# Patient Record
Sex: Female | Born: 1940 | Race: Black or African American | Hispanic: No | Smoking: Never smoker
Health system: Southern US, Community
[De-identification: ages and names within clinical notes are randomized; demographics above are authoritative.]

## PROBLEM LIST (undated history)

## (undated) DIAGNOSIS — N6009 Solitary cyst of unspecified breast: Secondary | ICD-10-CM

## (undated) DIAGNOSIS — Z923 Personal history of irradiation: Secondary | ICD-10-CM

## (undated) DIAGNOSIS — C50919 Malignant neoplasm of unspecified site of unspecified female breast: Secondary | ICD-10-CM

## (undated) DIAGNOSIS — M1990 Unspecified osteoarthritis, unspecified site: Secondary | ICD-10-CM

## (undated) DIAGNOSIS — R42 Dizziness and giddiness: Secondary | ICD-10-CM

## (undated) DIAGNOSIS — Z8041 Family history of malignant neoplasm of ovary: Secondary | ICD-10-CM

## (undated) DIAGNOSIS — Z853 Personal history of malignant neoplasm of breast: Secondary | ICD-10-CM

## (undated) DIAGNOSIS — I10 Essential (primary) hypertension: Secondary | ICD-10-CM

## (undated) DIAGNOSIS — Z8042 Family history of malignant neoplasm of prostate: Secondary | ICD-10-CM

## (undated) DIAGNOSIS — Z972 Presence of dental prosthetic device (complete) (partial): Secondary | ICD-10-CM

## (undated) DIAGNOSIS — Z801 Family history of malignant neoplasm of trachea, bronchus and lung: Secondary | ICD-10-CM

## (undated) DIAGNOSIS — Z803 Family history of malignant neoplasm of breast: Secondary | ICD-10-CM

## (undated) DIAGNOSIS — E785 Hyperlipidemia, unspecified: Secondary | ICD-10-CM

## (undated) HISTORY — DX: Hyperlipidemia, unspecified: E78.5

## (undated) HISTORY — DX: Malignant neoplasm of unspecified site of unspecified female breast: C50.919

## (undated) HISTORY — DX: Family history of malignant neoplasm of ovary: Z80.41

## (undated) HISTORY — DX: Family history of malignant neoplasm of trachea, bronchus and lung: Z80.1

## (undated) HISTORY — DX: Family history of malignant neoplasm of breast: Z80.3

## (undated) HISTORY — DX: Unspecified osteoarthritis, unspecified site: M19.90

## (undated) HISTORY — DX: Family history of malignant neoplasm of prostate: Z80.42

## (undated) HISTORY — DX: Solitary cyst of unspecified breast: N60.09

## (undated) HISTORY — DX: Personal history of malignant neoplasm of breast: Z85.3

## (undated) HISTORY — PX: SENTINEL LYMPH NODE BIOPSY: SHX2392

## (undated) HISTORY — DX: Essential (primary) hypertension: I10

---

## 1966-12-23 HISTORY — PX: ABDOMINAL HYSTERECTOMY: SHX81

## 2004-12-19 ENCOUNTER — Ambulatory Visit: Payer: Self-pay | Admitting: Unknown Physician Specialty

## 2005-12-24 ENCOUNTER — Ambulatory Visit: Payer: Self-pay | Admitting: Unknown Physician Specialty

## 2005-12-27 ENCOUNTER — Ambulatory Visit: Payer: Self-pay | Admitting: Unknown Physician Specialty

## 2006-05-09 ENCOUNTER — Ambulatory Visit: Payer: Self-pay | Admitting: Unknown Physician Specialty

## 2007-02-24 ENCOUNTER — Ambulatory Visit: Payer: Self-pay | Admitting: Unknown Physician Specialty

## 2008-04-18 ENCOUNTER — Ambulatory Visit: Payer: Self-pay | Admitting: Unknown Physician Specialty

## 2009-04-24 ENCOUNTER — Ambulatory Visit: Payer: Self-pay | Admitting: Unknown Physician Specialty

## 2010-05-01 ENCOUNTER — Ambulatory Visit: Payer: Self-pay | Admitting: Unknown Physician Specialty

## 2011-05-07 ENCOUNTER — Ambulatory Visit: Payer: Self-pay | Admitting: Unknown Physician Specialty

## 2012-05-08 ENCOUNTER — Ambulatory Visit: Payer: Self-pay | Admitting: Unknown Physician Specialty

## 2012-05-18 ENCOUNTER — Ambulatory Visit: Payer: Self-pay | Admitting: Unknown Physician Specialty

## 2012-12-23 DIAGNOSIS — C50919 Malignant neoplasm of unspecified site of unspecified female breast: Secondary | ICD-10-CM

## 2012-12-23 DIAGNOSIS — Z923 Personal history of irradiation: Secondary | ICD-10-CM

## 2012-12-23 HISTORY — DX: Personal history of irradiation: Z92.3

## 2012-12-23 HISTORY — PX: BREAST LUMPECTOMY: SHX2

## 2012-12-23 HISTORY — PX: BREAST CYST ASPIRATION: SHX578

## 2012-12-23 HISTORY — PX: BREAST BIOPSY: SHX20

## 2012-12-23 HISTORY — DX: Malignant neoplasm of unspecified site of unspecified female breast: C50.919

## 2013-05-10 ENCOUNTER — Ambulatory Visit: Payer: Self-pay | Admitting: Physician Assistant

## 2013-05-11 ENCOUNTER — Ambulatory Visit: Payer: Self-pay | Admitting: Physician Assistant

## 2013-05-27 ENCOUNTER — Encounter: Payer: Self-pay | Admitting: General Surgery

## 2013-05-27 ENCOUNTER — Ambulatory Visit (INDEPENDENT_AMBULATORY_CARE_PROVIDER_SITE_OTHER): Payer: Medicare Other | Admitting: General Surgery

## 2013-05-27 VITALS — BP 130/76 | HR 72 | Resp 12 | Ht 64.0 in | Wt 149.0 lb

## 2013-05-27 DIAGNOSIS — R928 Other abnormal and inconclusive findings on diagnostic imaging of breast: Secondary | ICD-10-CM

## 2013-05-27 NOTE — Patient Instructions (Addendum)
Patient advised that she needs to have a stereotactic breast biopsy done in the near future.     Breast Biopsy, Stereotactic A stereotactic breast biopsy takes a tissue sample from the breast with a special instrument. This is done when:  The problem (lump, abnormality, mass) can be seen on X-ray, but not felt on physical exam.  Suspicious, small calcium deposits (calcifications) are seen in the breast.  There is a change in shape or appearance of the breast, thickening, or asymmetry on mammogram (breast X-ray).  You have nipple changes (unusual or bloody discharge, crusting, retraction, dimpling).  Your caregiver is making a surgical diagnosis. The biopsy may be done on a special table, with your face down and your breasts placed through openings in the table. Computerized imaging (special form of X-rays) is used. The images are not obtained using regular X-ray film. So, exposure to radiation is reduced. Images are seen through several different angles. The surgeon removes small pieces of the suspicious tissue through a hollow needle. The tissue will be sent to the lab for analysis. The surgeon can look at the pictures right away, rather than wait for an X-ray to be developed. Your caregiver can mark the lesions (abnormal tissue formations) electronically. Then the computer can tell exactly where the problem is, or if it has moved. BENEFITS OF THE PROCEDURE  This is a good way to see if tiny lumps, abnormal looking tissue, or calcium deposits that you cannot feel are cancerous or require further treatment or follow-up.  Needle biopsy is a simple procedure. It may be performed in an outpatient imaging center. This means you have the procedure and go home the same day, without checking into a hospital.  It is less painful than open surgery. The results are as accurate as when a tissue sample is removed surgically.  The procedure is faster, less expensive, less invasive, does not distort the  breast, and leaves little or no scar.  Breast defects, which can make future mammograms hard to read and interpret, do not remain.  Recovery time is brief. Patients can soon resume their normal activities.  Using VAD (vacuum assisted device) may make it possible to remove entire lesions.  A breast biopsy can indicate if you need surgery, other treatment, or combined treatment. LET YOUR CAREGIVER KNOW ABOUT:  Allergies.  Medications taken, including herbs, eye drops, over-the-counter medications, and creams.  Use of steroids (by mouth or creams).  Previous problems with anesthetics or numbing medication.  If you are taking blood thinner medications or aspirin.  Possibility of pregnancy, if this applies.  History of blood clots (thrombophlebitis).  History of bleeding or blood problems.  Previous surgery.  Other health problems. RISKS AND COMPLICATIONS  Infection (germ growing in the wound). This can often be treated with antibiotics.  Bleeding, following surgery. Your surgeon takes every precaution to keep this from happening.  There is some concern that if a cancerous mass is present, cancer cells might be spread by the needle. Whether this actually happens is not known. It does not appear to be a significant risk.  X-ray guided breast biopsy is not infallible (not always correct). The problem may be missed or the extent of the problem may be underestimated. This would mean the biopsy did not manage to remove a piece of the diseased tissue or enough of the diseased tissue.  Lesions present, with calcium deposits scattered throughout the breast, are difficult to target by stereotactic method. Those lesions near the chest wall  also are hard to learn about by this method. If the mammogram shows only a vague change in tissue density, but no definite mass or nodule, the X-ray guided method may not be successful. Occasionally, even after a successful biopsy, the tissue diagnosis  remains uncertain. A surgical biopsy will be needed, if abnormal or precancerous cells are found on core biopsy.  Altering or deforming of the breast.  Unable to find, or missing the lesion.  Rarely, the needle may go through the chest wall into the lung area. TWO BIOPSY INSTRUMENTS MAY BE USED IN THE PROCEDURE The conventional biopsy device (core needle biopsy device) consists of an inner needle with a trough extending from it at one end, and an overlying sheath. It is attached to a spring-loaded mechanism that propels it forward. The trough fills with tissue. The outer sheath instantly moves forward to cut the tissue and keep it in the trough. Each sample is obtained in a fraction of a second. It is necessary to withdraw the needle after each sample is taken to collect the tissue.  A newer type of instrument, the VAD (vacuum assisted device), uses vacuum pressure to pull breast tissue into a needle and remove it. The needle does not need to be withdrawn after each sampling. Another advantage is that biopsies are obtained in an orderly manner, by rotating the device. This helps make sure that the entire area of interest will be sampled. When using the automated core biopsy needle, sampling is more random.  FOR COMFORT DURING THE TEST  Relax as much as possible.  Try to follow instructions, to speed up the test.  Let your caregiver know if you are uncomfortable, anxious, or in pain. PROCEDURE  You are awake during the procedure, and you go home the same day (outpatient). A specially trained radiologist will do this procedure. First, the skin is cleansed. Then, it is injected with a local anesthetic. A small nick is made in the skin, and the tip of the biopsy needle is put into the calculated site of the lesion. A special mammography machine uses ionizing radiation to help guide the radiologist's instrument to the site of the abnormal growth. At this point, stereo images are again obtained, to  confirm that the needle tip is at the problem area. Usually 5 to 10 samples are collected when doing a core biopsy. At least 12 are collected when using the vacuum assisted device (VAD). Then, a final set of images is obtained. If they show that the lesion has been mostly or completely removed, a small clip is left at the biopsy site. This is so that it can be easily located, in case the lesion turns out to be cancer. Afterward, the skin opening is stitched (sutured) or taped closed, and covered with a dressing. Your caregiver may apply a pressure dressing and an ice pack, to prevent bleeding and swelling in the breast.  X-ray guided breast biopsy can take 30 minutes to 1 hour, or more. The X-rays usually have no side effects, and no radiation remains in your body. There is usually little or no pain. Usually no scar is left from the tiny skin incision. Many women find that the major discomfort of the procedure is from lying on their stomach, or staying in 1 position for the length of the procedure. This discomfort may be reduced by carefully placed cushions. You should wear a good support bra to the procedure. You will be asked to remove jewelry, dentures, eye glasses,  metal objects, or clothing that might interfere with the X-ray images. You may want to have someone with you, to take you home after the procedure. AFTER THE PROCEDURE   After surgery, if you are doing well and have no problems, you will be allowed to go home.  You may resume your regular diet, or as directed by your caregiver. HOME CARE INSTRUCTIONS   Follow your caregiver's recommendations for medications, care of the biopsy site, follow-up appointments, and further treatment.  Only take over-the-counter or prescription medicines for pain, discomfort, or fever as directed by your caregiver.  An ice pack applied to the affected area may help with discomfort and keep the swelling down.  Change dressings as directed.  Wear a good  support bra for as long as your caregiver recommends.  Avoid strenuous activity for at least 24 hours, or as advised by your caregiver. Finding out the results of your test Not all test results are available during your visit. If your test results are not back during the visit, make an appointment with your caregiver to find out the results. Do not assume everything is normal if you have not heard from your caregiver or the medical facility. It is important for you to follow up on all of your test results.  SEEK MEDICAL CARE IF:   You develop a rash.  You have problems with your medicines.  You become lightheaded or dizzy. SEEK IMMEDIATE MEDICAL CARE IF:   There is increased bleeding (more than a small spot) from the biopsy site.  You notice redness, swelling, or increasing pain in the wound.  Pus is coming from the wound.  You have a fever.  You notice a bad smell coming from the wound or dressing.  You develop shortness of breath.  You develop chest pain.  You pass out. Document Released: 09/07/2003 Document Revised: 03/02/2012 Document Reviewed: 10/13/2009 [facility] Patient Information 2014 [place], [place].

## 2013-05-27 NOTE — Progress Notes (Signed)
Patient ID: Tina Johnston, female   DOB: 1941/01/07, 72 y.o.   MRN: [id number]  Chief Complaint  Patient presents with  . breast evaluation    category 4 mammogram new patient    HPI Tina Johnston is a 72 y.o. female here today for her follow up mammogram done on 05/10/13 with birad category 0. Additional views of the left breast and ultrasound of the left breast were done on 05/11/13 with a birad category 4. Patient does perform self breast checks and get regular mammograms. Her mother has a history of breast cancer diagnosed in her fifties. The patient states she has a history of bilateral breast cysts that have been drained in the past. HPI  Past Medical History  Diagnosis Date  . Breast cyst   . Arthritis   . Hypertension   . Hyperlipidemia     Past Surgical History  Procedure Laterality Date  . Abdominal hysterectomy  1968    Family History  Problem Relation Age of Onset  . Cancer Mother 73    breast    Social History History  Substance Use Topics  . Smoking status: Never Smoker   . Smokeless tobacco: Never Used  . Alcohol Use: No    No Known Allergies  Current Outpatient Prescriptions  Medication Sig Dispense Refill  . Calcium Carb-Cholecalciferol (CALCIUM PLUS VITAMIN D3 PO) Take 1 tablet by mouth daily.      . Glucosamine-MSM-Hyaluronic Acd (JOINT HEALTH PO) Take 2 capsules by mouth daily.      . meloxicam (MOBIC) 15 MG tablet Take 15 mg by mouth daily.      . potassium chloride (K-DUR) 10 MEQ tablet Take 1 tablet by mouth 2 (two) times daily.      . simvastatin (ZOCOR) 20 MG tablet Take 1 tablet by mouth daily.      Marland Kitchen triamterene-hydrochlorothiazide (MAXZIDE-25) 37.5-25 MG per tablet Take 1 tablet by mouth daily.       No current facility-administered medications for this visit.    Review of Systems Review of Systems  Constitutional: Negative.   Respiratory: Negative.   Cardiovascular: Negative.     Blood pressure 130/76, pulse 72, resp. rate 12,  height 5\' 4"  (1.626 m), weight 149 lb (67.586 kg).  Physical Exam Physical Exam  Constitutional: She appears well-developed and well-nourished.  Eyes: Conjunctivae are normal. No scleral icterus.  Neck: Trachea normal. No mass and no thyromegaly present.  Cardiovascular: Normal rate, regular rhythm, normal heart sounds and normal pulses.   No murmur heard. Pulmonary/Chest: Effort normal and breath sounds normal. Right breast exhibits no inverted nipple, no mass, no nipple discharge, no skin change and no tenderness. Left breast exhibits no inverted nipple, no mass, no nipple discharge, no skin change and no tenderness. Breasts are symmetrical.  Lymphadenopathy:    She has no cervical adenopathy.    She has no axillary adenopathy.    Data Reviewed  Mammogram reviewed showing adjacent areas of concern. One with microcalcification's and one with ill defined density.   Assessment    Recommends stereotactic biopsy of left breast.     Plan    Patient to have left breast stereotactic biopsy done.  Procedure explained to her.        SANKAR,SEEPLAPUTHUR G 05/27/2013, 8:54 PM

## 2013-05-28 ENCOUNTER — Other Ambulatory Visit: Payer: Self-pay

## 2013-05-28 DIAGNOSIS — R920 Mammographic microcalcification found on diagnostic imaging of breast: Secondary | ICD-10-CM

## 2013-05-28 NOTE — Progress Notes (Signed)
Patient scheduled for left stereotactic biopsy, for two sets of microcalcifications, on Monday, June 16 at 4:00 pm.  Patient informed.  Post biopsy appt with RN set for 06-15-13.

## 2013-06-07 ENCOUNTER — Ambulatory Visit: Payer: Self-pay | Admitting: General Surgery

## 2013-06-07 DIAGNOSIS — R920 Mammographic microcalcification found on diagnostic imaging of breast: Secondary | ICD-10-CM

## 2013-06-10 ENCOUNTER — Encounter: Payer: Self-pay | Admitting: General Surgery

## 2013-06-10 ENCOUNTER — Ambulatory Visit (INDEPENDENT_AMBULATORY_CARE_PROVIDER_SITE_OTHER): Payer: Medicare Other | Admitting: General Surgery

## 2013-06-10 VITALS — BP 130/76 | HR 74 | Resp 12 | Ht 62.0 in | Wt 143.0 lb

## 2013-06-10 DIAGNOSIS — C50919 Malignant neoplasm of unspecified site of unspecified female breast: Secondary | ICD-10-CM

## 2013-06-10 DIAGNOSIS — C50912 Malignant neoplasm of unspecified site of left female breast: Secondary | ICD-10-CM

## 2013-06-10 DIAGNOSIS — R920 Mammographic microcalcification found on diagnostic imaging of breast: Secondary | ICD-10-CM

## 2013-06-10 NOTE — Progress Notes (Signed)
Patient ID: Tina Johnston, female   DOB: 05-Dec-1941, 72 y.o.   MRN: [id number] Pt had stereotactic biopsy completes . Both areas-density and microcalcifications showed cancer. One inlocted lower in left breast suprior aspect had invasive cancer. 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 Pt was here with her sister. Findings were explained in full. PRoles of ER/PR,Her 2 neu, radiation, possible chemo, staus of axillar nodes all discussed.  Option of lumpectomy is feasible as the two lesions are relatively close by.  Discussed lumpectomy, mastectomy, sentinel node biopsy-explained procedures and risks . Pt is leaning toward lumpectomy. Will make decision after ER/PR and her 2 status is available.

## 2013-06-10 NOTE — Patient Instructions (Signed)
Await ER/PR and her 2 reports. Will decide on lumpectomy vs matectomy after

## 2013-06-15 ENCOUNTER — Telehealth: Payer: Self-pay | Admitting: General Surgery

## 2013-06-15 ENCOUNTER — Ambulatory Visit (INDEPENDENT_AMBULATORY_CARE_PROVIDER_SITE_OTHER): Payer: Medicare Other | Admitting: *Deleted

## 2013-06-15 ENCOUNTER — Encounter: Payer: Self-pay | Admitting: General Surgery

## 2013-06-15 DIAGNOSIS — C50912 Malignant neoplasm of unspecified site of left female breast: Secondary | ICD-10-CM

## 2013-06-15 DIAGNOSIS — C50919 Malignant neoplasm of unspecified site of unspecified female breast: Secondary | ICD-10-CM

## 2013-06-15 NOTE — Telephone Encounter (Signed)
Er/PR are both pos on herleft breast cancer. After review of her mammogram feel it is reasonable to get MRI- if no other areas of concern can proceed with lumpectomy.  Pt advise and is agreeable.

## 2013-06-15 NOTE — Patient Instructions (Addendum)
The patient is aware that a heating pad may be used for comfort as needed.  Aware of pathology. Follow up as scheduled afeter lab work results.

## 2013-06-15 NOTE — Progress Notes (Signed)
Patient here today for follow up post left breast biopsy. No dressing, steristrip in place and aware it may come off in one week.  Minimal bruising noted.  The patient is aware that a heating pad may be used for comfort as needed.  Aware of pathology. Follow up as scheduled after lab work results.

## 2013-06-16 ENCOUNTER — Other Ambulatory Visit: Payer: Self-pay | Admitting: *Deleted

## 2013-06-16 DIAGNOSIS — C50919 Malignant neoplasm of unspecified site of unspecified female breast: Secondary | ICD-10-CM

## 2013-06-16 NOTE — Progress Notes (Signed)
Patient to have a bilateral breast MRI at the [facility]. Date to be announced.

## 2013-06-21 ENCOUNTER — Ambulatory Visit
Admission: RE | Admit: 2013-06-21 | Discharge: 2013-06-21 | Disposition: A | Discharge status: Home / Self Care | Payer: Medicare Other | Source: Ambulatory Visit | Attending: General Surgery | Admitting: General Surgery

## 2013-06-21 ENCOUNTER — Encounter: Payer: Self-pay | Admitting: General Surgery

## 2013-06-21 MED ORDER — GADOBENATE DIMEGLUMINE 529 MG/ML IV SOLN
13.0000 mL | Freq: Once | INTRAVENOUS | Status: AC | PRN
  Administered 2013-06-21: 13 mL via INTRAVENOUS

## 2013-06-22 LAB — PATHOLOGY REPORT

## 2013-06-24 ENCOUNTER — Encounter: Payer: Self-pay | Admitting: General Surgery

## 2013-06-24 ENCOUNTER — Other Ambulatory Visit: Payer: Self-pay

## 2013-06-24 ENCOUNTER — Ambulatory Visit (INDEPENDENT_AMBULATORY_CARE_PROVIDER_SITE_OTHER): Payer: Medicare Other | Admitting: General Surgery

## 2013-06-24 VITALS — BP 110/70 | HR 84 | Resp 16 | Ht 63.0 in | Wt 149.0 lb

## 2013-06-24 DIAGNOSIS — N63 Unspecified lump in unspecified breast: Secondary | ICD-10-CM

## 2013-06-24 NOTE — Progress Notes (Signed)
Patient ID: Tina Johnston, female   DOB: 03/24/1941, 72 y.o.   MRN: [id number]  Chief Complaint  Patient presents with  . Follow-up    right breast ultrasound possible core biopsy    HPI Tina Johnston is a 72 y.o. female who presents for a right breast ultrasound and possible core biopsy. Breast MR showed the 2 areas in upper left breast that were biopsied-CA.  Also noted was a mass in right breast 9 o'cl. This area needed [place].  HPI  Past Medical History  Diagnosis Date  . Breast cyst   . Arthritis   . Hypertension   . Hyperlipidemia     Past Surgical History  Procedure Laterality Date  . Abdominal hysterectomy  1968    Family History  Problem Relation Age of Onset  . Cancer Mother 3    breast    Social History History  Substance Use Topics  . Smoking status: Never Smoker   . Smokeless tobacco: Never Used  . Alcohol Use: No    No Known Allergies  Current Outpatient Prescriptions  Medication Sig Dispense Refill  . Calcium Carb-Cholecalciferol (CALCIUM PLUS VITAMIN D3 PO) Take 1 tablet by mouth daily.      . Glucosamine-MSM-Hyaluronic Acd (JOINT HEALTH PO) Take 2 capsules by mouth daily.      . meloxicam (MOBIC) 15 MG tablet Take 15 mg by mouth daily.      . potassium chloride (K-DUR) 10 MEQ tablet Take 1 tablet by mouth 2 (two) times daily.      . simvastatin (ZOCOR) 20 MG tablet Take 1 tablet by mouth daily.      Marland Kitchen triamterene-hydrochlorothiazide (MAXZIDE-25) 37.5-25 MG per tablet Take 1 tablet by mouth daily.       No current facility-administered medications for this visit.    Review of Systems Review of Systems  Constitutional: Negative.   Respiratory: Negative.   Cardiovascular: Negative.     Blood pressure 110/70, pulse 84, resp. rate 16, height 5\' 3"  (1.6 m), weight 149 lb (67.586 kg).  Physical Exam Physical Exam  Data Reviewed MRI breast  Assessment    [place] today showed a small 7mm mas with shadowing at 9 o'cl right corresponding to  finding on MRI.      Plan    Core biopsy right breast mass completed today. After path available on right breast biopsy will plan surgical treatment.        SANKAR,SEEPLAPUTHUR G 06/24/2013, 8:47 AM

## 2013-06-24 NOTE — Patient Instructions (Addendum)

## 2013-06-28 ENCOUNTER — Telehealth: Payer: Self-pay

## 2013-06-28 NOTE — Telephone Encounter (Signed)
Dr. Delrae Alfred called.  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  Will fax detailed report when completed.

## 2013-06-29 ENCOUNTER — Other Ambulatory Visit: Payer: Self-pay | Admitting: General Surgery

## 2013-06-29 DIAGNOSIS — C50919 Malignant neoplasm of unspecified site of unspecified female breast: Secondary | ICD-10-CM

## 2013-07-05 ENCOUNTER — Encounter: Payer: Self-pay | Admitting: General Surgery

## 2013-07-08 ENCOUNTER — Ambulatory Visit: Payer: Self-pay | Admitting: General Surgery

## 2013-07-08 DIAGNOSIS — I10 Essential (primary) hypertension: Secondary | ICD-10-CM

## 2013-07-08 LAB — COMPREHENSIVE METABOLIC PANEL
Albumin: 3.6 g/dL (ref 3.4–5.0)
Alkaline Phosphatase: 94 U/L (ref 50–136)
Anion Gap: 7 (ref 7–16)
BUN: 11 mg/dL (ref 7–18)
Bilirubin,Total: 0.2 mg/dL (ref 0.2–1.0)
Calcium, Total: 9.6 mg/dL (ref 8.5–10.1)
Chloride: 104 mmol/L (ref 98–107)
Co2: 29 mmol/L (ref 21–32)
Creatinine: 1.1 mg/dL (ref 0.60–1.30)
EGFR (African American): 57 — ABNORMAL LOW
EGFR (Non-African Amer.): 49 — ABNORMAL LOW
Glucose: 101 mg/dL — ABNORMAL HIGH (ref 65–99)
Osmolality: 279 (ref 275–301)
Potassium: 3.8 mmol/L (ref 3.5–5.1)
SGOT(AST): 23 U/L (ref 15–37)
SGPT (ALT): 27 U/L (ref 12–78)
Sodium: 140 mmol/L (ref 136–145)
Total Protein: 8.1 g/dL (ref 6.4–8.2)

## 2013-07-08 LAB — CBC WITH DIFFERENTIAL/PLATELET
Basophil #: 0 10*3/uL (ref 0.0–0.1)
Basophil %: 0.6 %
Eosinophil #: 0.2 10*3/uL (ref 0.0–0.7)
Eosinophil %: 2.9 %
HCT: 37.8 % (ref 35.0–47.0)
HGB: 12.5 g/dL (ref 12.0–16.0)
Lymphocyte #: 2.1 10*3/uL (ref 1.0–3.6)
Lymphocyte %: 30.9 %
MCH: 28.3 pg (ref 26.0–34.0)
MCHC: 33 g/dL (ref 32.0–36.0)
MCV: 86 fL (ref 80–100)
Monocyte #: 0.5 x10 3/mm (ref 0.2–0.9)
Monocyte %: 7 %
Neutrophil #: 4.1 10*3/uL (ref 1.4–6.5)
Neutrophil %: 58.6 %
Platelet: 377 10*3/uL (ref 150–440)
RBC: 4.4 10*6/uL (ref 3.80–5.20)
RDW: 14.3 % (ref 11.5–14.5)
WBC: 6.9 10*3/uL (ref 3.6–11.0)

## 2013-07-09 ENCOUNTER — Encounter: Payer: Self-pay | Admitting: General Surgery

## 2013-07-12 ENCOUNTER — Encounter: Payer: Self-pay | Admitting: General Surgery

## 2013-07-12 ENCOUNTER — Ambulatory Visit (INDEPENDENT_AMBULATORY_CARE_PROVIDER_SITE_OTHER): Payer: Medicare Other | Admitting: General Surgery

## 2013-07-12 VITALS — BP 128/72 | HR 68 | Resp 12 | Ht 63.0 in | Wt 149.0 lb

## 2013-07-12 DIAGNOSIS — C50411 Malignant neoplasm of upper-outer quadrant of right female breast: Secondary | ICD-10-CM

## 2013-07-12 DIAGNOSIS — C50412 Malignant neoplasm of upper-outer quadrant of left female breast: Secondary | ICD-10-CM

## 2013-07-12 DIAGNOSIS — C50419 Malignant neoplasm of upper-outer quadrant of unspecified female breast: Secondary | ICD-10-CM

## 2013-07-12 LAB — PATHOLOGY

## 2013-07-12 NOTE — Progress Notes (Signed)
Patient ID: Tina Johnston, female   DOB: November 11, 1941, 72 y.o.   MRN: [id number]  Chief Complaint  Patient presents with  . Follow-up    u/s    HPI Tina Johnston is a 72 y.o. female here today for her pre op ultrasound. Pt has bilateral breast CA, ER/PR pos and her 2 neg. She is scheduled for lumpectomy/SN biopsy on both sides. Here for [place] too see if the biopsy sites are visible. HPI  Past Medical History  Diagnosis Date  . Breast cyst   . Arthritis   . Hypertension   . Hyperlipidemia     Past Surgical History  Procedure Laterality Date  . Abdominal hysterectomy  1968    Family History  Problem Relation Age of Onset  . Cancer Mother 105    breast    Social History History  Substance Use Topics  . Smoking status: Never Smoker   . Smokeless tobacco: Never Used  . Alcohol Use: No    No Known Allergies  Current Outpatient Prescriptions  Medication Sig Dispense Refill  . Calcium Carb-Cholecalciferol (CALCIUM PLUS VITAMIN D3 PO) Take 1 tablet by mouth daily.      . Glucosamine-MSM-Hyaluronic Acd (JOINT HEALTH PO) Take 2 capsules by mouth daily.      . meloxicam (MOBIC) 15 MG tablet Take 15 mg by mouth daily.      . potassium chloride (K-DUR) 10 MEQ tablet Take 1 tablet by mouth 2 (two) times daily.      . simvastatin (ZOCOR) 20 MG tablet Take 1 tablet by mouth daily.      Marland Kitchen triamterene-hydrochlorothiazide (MAXZIDE-25) 37.5-25 MG per tablet Take 1 tablet by mouth daily.       No current facility-administered medications for this visit.    Review of Systems Review of Systems  Constitutional: Negative.   Respiratory: Negative.   Cardiovascular: Negative.     Blood pressure 128/72, pulse 68, resp. rate 12, height 5\' 3"  (1.6 m), weight 149 lb (67.586 kg).  Physical Exam Physical Exam [place] of right breast at 9 o'cl shows biopsy site well. [place] of left breast at 12 o'cl also shows 2 biopsy sites. OK to proceed with [place] localization for lumpectomy. Data Reviewed [place]  today  Assessment    Bilateral breast CA     Plan    Proceed with surgery as planned        SANKAR,SEEPLAPUTHUR G 07/13/2013, 7:52 AM

## 2013-07-13 ENCOUNTER — Encounter: Payer: Self-pay | Admitting: General Surgery

## 2013-07-13 DIAGNOSIS — C50419 Malignant neoplasm of upper-outer quadrant of unspecified female breast: Secondary | ICD-10-CM | POA: Insufficient documentation

## 2013-07-13 NOTE — Patient Instructions (Signed)
Advised on surgery as discussed before

## 2013-07-15 ENCOUNTER — Ambulatory Visit: Payer: Self-pay | Admitting: General Surgery

## 2013-07-15 ENCOUNTER — Encounter: Payer: Self-pay | Admitting: General Surgery

## 2013-07-15 DIAGNOSIS — C50419 Malignant neoplasm of upper-outer quadrant of unspecified female breast: Secondary | ICD-10-CM

## 2013-07-15 HISTORY — PX: BREAST SURGERY: SHX581

## 2013-07-19 ENCOUNTER — Encounter: Payer: Self-pay | Admitting: General Surgery

## 2013-07-20 LAB — PATHOLOGY REPORT

## 2013-07-23 ENCOUNTER — Encounter: Payer: Self-pay | Admitting: General Surgery

## 2013-07-27 ENCOUNTER — Encounter: Payer: Self-pay | Admitting: General Surgery

## 2013-07-27 ENCOUNTER — Ambulatory Visit (INDEPENDENT_AMBULATORY_CARE_PROVIDER_SITE_OTHER): Payer: Medicare Other | Admitting: General Surgery

## 2013-07-27 ENCOUNTER — Other Ambulatory Visit: Payer: Self-pay | Admitting: Physician Assistant

## 2013-07-27 VITALS — BP 122/78 | HR 74 | Resp 16 | Ht 63.0 in | Wt 149.0 lb

## 2013-07-27 DIAGNOSIS — C50412 Malignant neoplasm of upper-outer quadrant of left female breast: Secondary | ICD-10-CM

## 2013-07-27 DIAGNOSIS — C50419 Malignant neoplasm of upper-outer quadrant of unspecified female breast: Secondary | ICD-10-CM

## 2013-07-27 DIAGNOSIS — C50411 Malignant neoplasm of upper-outer quadrant of right female breast: Secondary | ICD-10-CM

## 2013-07-27 NOTE — Patient Instructions (Addendum)
Call office for any breast issues or concerns.  Patient's surgery has been scheduled for 07-30-13 at [facility].

## 2013-07-27 NOTE — Progress Notes (Signed)
Patient ID: Tina Johnston, female   DOB: Apr 07, 1941, 72 y.o.   MRN: [id number]  Chief Complaint  Patient presents with  . Routine Post Op    7-14 day post op bilateral breast wide excision with sentinenl node biopsy    HPI Tina Johnston is a 72 y.o. female who presents for a routine 7-14 day post op bilateral breast wide excision with sentinel node biopsy. The procedure was performed on 07/15/13. Left breast more tender than the right side. HPI  Past Medical History  Diagnosis Date  . Breast cyst   . Arthritis   . Hypertension   . Hyperlipidemia     Past Surgical History  Procedure Laterality Date  . Abdominal hysterectomy  1968  . Breast surgery Bilateral July 15 2013    wide excision w sentinel node biopsy    Family History  Problem Relation Age of Onset  . Cancer Mother 10    breast    Social History History  Substance Use Topics  . Smoking status: Never Smoker   . Smokeless tobacco: Never Used  . Alcohol Use: No    Allergies  Allergen Reactions  . Ultram (Tramadol) Itching    Current Outpatient Prescriptions  Medication Sig Dispense Refill  . Calcium Carb-Cholecalciferol (CALCIUM PLUS VITAMIN D3 PO) Take 1 tablet by mouth daily.      . Glucosamine-MSM-Hyaluronic Acd (JOINT HEALTH PO) Take 2 capsules by mouth daily.      . meloxicam (MOBIC) 15 MG tablet Take 15 mg by mouth daily.      . potassium chloride (K-DUR) 10 MEQ tablet Take 1 tablet by mouth 2 (two) times daily.      . simvastatin (ZOCOR) 20 MG tablet Take 1 tablet by mouth daily.      Marland Kitchen triamterene-hydrochlorothiazide (MAXZIDE-25) 37.5-25 MG per tablet Take 1 tablet by mouth daily.       No current facility-administered medications for this visit.    Review of Systems Review of Systems  Constitutional: Negative.   Respiratory: Negative.   Cardiovascular: Negative.     Blood pressure 122/78, pulse 74, resp. rate 16, height 5\' 3"  (1.6 m), weight 149 lb (67.586 kg).  Physical  Exam Physical Exam  Constitutional: She is oriented to person, place, and time. She appears well-developed and well-nourished.  Neurological: She is alert and oriented to person, place, and time.  Skin: Skin is warm and dry.   Bilateral breast incisions and axillary healing well. 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 Focally at medial margin. Right side with 1.1 cm invasive cancaer, DCIS involves inferior margin. SN on both sides are negative.  Assessment    Pt needs reexcison to get clear margins, followed by radiation and subsequent antihormonal therapy. Discussed fully with pt. She is agreeable.     Plan          Patient's surgery has been scheduled for 07-30-13 at [facility].    Tina Johnston M 07/27/2013, 2:13 PM

## 2013-07-30 ENCOUNTER — Ambulatory Visit: Payer: Self-pay | Admitting: General Surgery

## 2013-07-30 DIAGNOSIS — C50419 Malignant neoplasm of upper-outer quadrant of unspecified female breast: Secondary | ICD-10-CM

## 2013-07-30 DIAGNOSIS — C50919 Malignant neoplasm of unspecified site of unspecified female breast: Secondary | ICD-10-CM

## 2013-08-03 LAB — HER-2 / NEU, FISH
Avg Num Her-2 signals/nucleus:: 1.5
HER-2/CEP17 Ratio: 1.09
Number of Observers:: 2
Number of Tumor Cells Counted:: 40

## 2013-08-03 LAB — ER/PR,IMMUNOHISTOCHEM,PARAFFIN

## 2013-08-04 ENCOUNTER — Encounter: Payer: Self-pay | Admitting: General Surgery

## 2013-08-06 LAB — PATHOLOGY REPORT

## 2013-08-09 ENCOUNTER — Encounter: Payer: Self-pay | Admitting: General Surgery

## 2013-08-09 ENCOUNTER — Ambulatory Visit (INDEPENDENT_AMBULATORY_CARE_PROVIDER_SITE_OTHER): Payer: Medicare Other | Admitting: General Surgery

## 2013-08-09 VITALS — BP 134/80 | HR 72 | Resp 12 | Ht 63.0 in | Wt 149.0 lb

## 2013-08-09 DIAGNOSIS — C50419 Malignant neoplasm of upper-outer quadrant of unspecified female breast: Secondary | ICD-10-CM

## 2013-08-09 DIAGNOSIS — C50919 Malignant neoplasm of unspecified site of unspecified female breast: Secondary | ICD-10-CM

## 2013-08-09 DIAGNOSIS — C50412 Malignant neoplasm of upper-outer quadrant of left female breast: Secondary | ICD-10-CM

## 2013-08-09 DIAGNOSIS — C50411 Malignant neoplasm of upper-outer quadrant of right female breast: Secondary | ICD-10-CM

## 2013-08-09 NOTE — Patient Instructions (Addendum)
Resume normal activities as tolerated.  Patient to see Dr. Rushie Chestnut at the [facility] on 08-17-13 at 9 am. She is aware of date, time, and instructions.

## 2013-08-09 NOTE — Progress Notes (Signed)
Tina Johnston is a 72 y.o. female who presents for a routine 7-14 day post op bilateral breast wide re excision for margins.  Incisions look clean. Path reveals the margins are clear. Patient doing well post op. Oncotype DX is pending. She has ER/PR pos, her 2 neg cancer bilateral, SN neg.    Patient to see Dr. Rushie Chestnut at the [facility] on 08-17-13 at 9 am. She is aware of date, time, and instructions.

## 2013-08-10 ENCOUNTER — Encounter: Payer: Self-pay | Admitting: General Surgery

## 2013-08-10 DIAGNOSIS — C50919 Malignant neoplasm of unspecified site of unspecified female breast: Secondary | ICD-10-CM | POA: Insufficient documentation

## 2013-08-10 DIAGNOSIS — C50911 Malignant neoplasm of unspecified site of right female breast: Secondary | ICD-10-CM | POA: Insufficient documentation

## 2013-08-11 ENCOUNTER — Telehealth: Payer: Self-pay | Admitting: *Deleted

## 2013-08-11 ENCOUNTER — Ambulatory Visit: Payer: Self-pay | Admitting: Oncology

## 2013-08-11 NOTE — Telephone Encounter (Signed)
Oncotype testing called and regarding left and right breast specimens.  Per Dr Evette Cristal start with right breast testing for now. [facility] (onotype testing center) aware and Dr Forde Dandy will leave message for Dr Oneita Kras as well [facility])

## 2013-08-16 ENCOUNTER — Ambulatory Visit: Payer: Self-pay | Admitting: Radiation Oncology

## 2013-08-23 ENCOUNTER — Ambulatory Visit: Payer: Self-pay | Admitting: Radiation Oncology

## 2013-08-23 ENCOUNTER — Ambulatory Visit: Payer: Self-pay | Admitting: Oncology

## 2013-08-26 NOTE — Telephone Encounter (Signed)
Notified Chris at [facility] that the left breast testing is not needed per Dr Evette Cristal.

## 2013-09-22 ENCOUNTER — Ambulatory Visit: Payer: Self-pay | Admitting: Radiation Oncology

## 2013-09-22 ENCOUNTER — Ambulatory Visit: Payer: Self-pay | Admitting: Oncology

## 2013-10-15 LAB — CBC CANCER CENTER
Basophil #: 0 x10 3/mm (ref 0.0–0.1)
Eosinophil #: 0.2 x10 3/mm (ref 0.0–0.7)
Eosinophil %: 4.1 %
HCT: 39.5 % (ref 35.0–47.0)
HGB: 13.1 g/dL (ref 12.0–16.0)
Lymphocyte %: 21.4 %
MCH: 28.8 pg (ref 26.0–34.0)
MCHC: 33.1 g/dL (ref 32.0–36.0)
MCV: 87 fL (ref 80–100)
Monocyte %: 11.2 %
Neutrophil %: 62.8 %
RBC: 4.54 10*6/uL (ref 3.80–5.20)

## 2013-10-22 LAB — CBC CANCER CENTER
Basophil %: 0.4 %
Eosinophil #: 0.2 x10 3/mm (ref 0.0–0.7)
HGB: 12.9 g/dL (ref 12.0–16.0)
MCH: 27.9 pg (ref 26.0–34.0)
MCHC: 32.4 g/dL (ref 32.0–36.0)
MCV: 86 fL (ref 80–100)
Monocyte #: 0.4 x10 3/mm (ref 0.2–0.9)
Monocyte %: 7.4 %
Neutrophil #: 3.8 x10 3/mm (ref 1.4–6.5)
Neutrophil %: 70.7 %
Platelet: 363 x10 3/mm (ref 150–440)
RBC: 4.63 10*6/uL (ref 3.80–5.20)
RDW: 14.8 % — ABNORMAL HIGH (ref 11.5–14.5)
WBC: 5.4 x10 3/mm (ref 3.6–11.0)

## 2013-10-23 ENCOUNTER — Ambulatory Visit: Payer: Self-pay | Admitting: Radiation Oncology

## 2013-10-23 ENCOUNTER — Ambulatory Visit: Payer: Self-pay | Admitting: Oncology

## 2013-10-25 LAB — HEPATIC FUNCTION PANEL A (ARMC)
Albumin: 3.6 g/dL (ref 3.4–5.0)
Bilirubin,Total: 0.3 mg/dL (ref 0.2–1.0)
SGOT(AST): 25 U/L (ref 15–37)

## 2013-10-25 LAB — CBC CANCER CENTER
Basophil #: 0 x10 3/mm (ref 0.0–0.1)
Basophil %: 0.7 %
HCT: 38.7 % (ref 35.0–47.0)
Lymphocyte #: 1 x10 3/mm (ref 1.0–3.6)
Lymphocyte %: 18.6 %
MCH: 28 pg (ref 26.0–34.0)
MCHC: 32.4 g/dL (ref 32.0–36.0)
MCV: 87 fL (ref 80–100)
Neutrophil #: 3.4 x10 3/mm (ref 1.4–6.5)
Neutrophil %: 65.9 %
Platelet: 357 x10 3/mm (ref 150–440)
WBC: 5.1 x10 3/mm (ref 3.6–11.0)

## 2013-10-25 LAB — CREATININE, SERUM
Creatinine: 1.01 mg/dL (ref 0.60–1.30)
EGFR (African American): >60
EGFR (Non-African Amer.): 55 — ABNORMAL LOW

## 2013-11-22 ENCOUNTER — Ambulatory Visit: Payer: Self-pay | Admitting: Radiation Oncology

## 2013-11-22 ENCOUNTER — Ambulatory Visit: Payer: Self-pay | Admitting: Oncology

## 2014-01-24 ENCOUNTER — Ambulatory Visit: Payer: Self-pay | Admitting: Internal Medicine

## 2014-01-25 LAB — CREATININE, SERUM
Creatinine: 1.06 mg/dL (ref 0.60–1.30)
EGFR (Non-African Amer.): 52 — ABNORMAL LOW
GFR CALC AF AMER: 60 — AB

## 2014-01-25 LAB — CBC CANCER CENTER
Basophil #: 0 x10 3/mm (ref 0.0–0.1)
Basophil %: 0.4 %
EOS ABS: 0.1 x10 3/mm (ref 0.0–0.7)
EOS PCT: 2.7 %
HCT: 37.4 % (ref 35.0–47.0)
HGB: 12.3 g/dL (ref 12.0–16.0)
LYMPHS ABS: 1.3 x10 3/mm (ref 1.0–3.6)
Lymphocyte %: 23.6 %
MCH: 28 pg (ref 26.0–34.0)
MCHC: 32.8 g/dL (ref 32.0–36.0)
MCV: 85 fL (ref 80–100)
MONO ABS: 0.4 x10 3/mm (ref 0.2–0.9)
MONOS PCT: 7.3 %
NEUTROS ABS: 3.6 x10 3/mm (ref 1.4–6.5)
Neutrophil %: 66 %
Platelet: 383 x10 3/mm (ref 150–440)
RBC: 4.38 10*6/uL (ref 3.80–5.20)
RDW: 14.8 % — AB (ref 11.5–14.5)
WBC: 5.4 x10 3/mm (ref 3.6–11.0)

## 2014-01-25 LAB — HEPATIC FUNCTION PANEL A (ARMC)
Albumin: 3.7 g/dL (ref 3.4–5.0)
Alkaline Phosphatase: 88 U/L
Bilirubin, Direct: 0.1 mg/dL (ref 0.00–0.20)
Bilirubin,Total: 0.3 mg/dL (ref 0.2–1.0)
SGOT(AST): 26 U/L (ref 15–37)
SGPT (ALT): 33 U/L (ref 12–78)
Total Protein: 8 g/dL (ref 6.4–8.2)

## 2014-01-26 LAB — CANCER ANTIGEN 27.29: CA 27.29: 18.7 U/mL (ref 0.0–38.6)

## 2014-02-20 ENCOUNTER — Ambulatory Visit: Payer: Self-pay | Admitting: Internal Medicine

## 2014-05-12 ENCOUNTER — Ambulatory Visit: Payer: Self-pay | Admitting: Physician Assistant

## 2014-05-24 ENCOUNTER — Ambulatory Visit: Payer: Self-pay | Admitting: Radiation Oncology

## 2014-05-25 LAB — CBC CANCER CENTER
Basophil #: 0 x10 3/mm (ref 0.0–0.1)
Basophil %: 0.5 %
EOS ABS: 0.2 x10 3/mm (ref 0.0–0.7)
Eosinophil %: 2.9 %
HCT: 36.9 % (ref 35.0–47.0)
HGB: 12 g/dL (ref 12.0–16.0)
Lymphocyte #: 1.7 x10 3/mm (ref 1.0–3.6)
Lymphocyte %: 27.3 %
MCH: 28 pg (ref 26.0–34.0)
MCHC: 32.6 g/dL (ref 32.0–36.0)
MCV: 86 fL (ref 80–100)
Monocyte #: 0.4 x10 3/mm (ref 0.2–0.9)
Monocyte %: 6.3 %
Neutrophil #: 4 x10 3/mm (ref 1.4–6.5)
Neutrophil %: 63 %
PLATELETS: 371 x10 3/mm (ref 150–440)
RBC: 4.3 10*6/uL (ref 3.80–5.20)
RDW: 14.5 % (ref 11.5–14.5)
WBC: 6.3 x10 3/mm (ref 3.6–11.0)

## 2014-05-25 LAB — CREATININE, SERUM
Creatinine: 1.04 mg/dL (ref 0.60–1.30)
EGFR (African American): >60
EGFR (Non-African Amer.): 53 — ABNORMAL LOW

## 2014-05-25 LAB — HEPATIC FUNCTION PANEL A (ARMC)
AST: 16 U/L (ref 15–37)
Albumin: 3.8 g/dL (ref 3.4–5.0)
Alkaline Phosphatase: 78 U/L
BILIRUBIN TOTAL: 0.4 mg/dL (ref 0.2–1.0)
Bilirubin, Direct: 0.1 mg/dL (ref 0.00–0.20)
SGPT (ALT): 25 U/L (ref 12–78)
TOTAL PROTEIN: 8.1 g/dL (ref 6.4–8.2)

## 2014-05-26 LAB — CANCER ANTIGEN 27.29: CA 27.29: 15 U/mL (ref 0.0–38.6)

## 2014-06-22 ENCOUNTER — Ambulatory Visit: Payer: Self-pay | Admitting: Radiation Oncology

## 2014-09-07 ENCOUNTER — Ambulatory Visit: Payer: Self-pay | Admitting: Unknown Physician Specialty

## 2014-10-24 ENCOUNTER — Encounter: Payer: Self-pay | Admitting: General Surgery

## 2014-10-31 ENCOUNTER — Observation Stay: Payer: Self-pay | Admitting: Internal Medicine

## 2014-10-31 LAB — URINALYSIS, COMPLETE
BACTERIA: NONE SEEN
BILIRUBIN, UR: NEGATIVE
BLOOD: NEGATIVE
Glucose,UR: NEGATIVE mg/dL (ref 0–75)
Ketone: NEGATIVE
Leukocyte Esterase: NEGATIVE
Nitrite: NEGATIVE
Ph: 8 (ref 4.5–8.0)
Protein: NEGATIVE
RBC,UR: 2 /HPF (ref 0–5)
Specific Gravity: 1.009 (ref 1.003–1.030)
Squamous Epithelial: 1
WBC UR: 1 /HPF (ref 0–5)

## 2014-10-31 LAB — CBC WITH DIFFERENTIAL/PLATELET
BASOS ABS: 0 10*3/uL (ref 0.0–0.1)
BASOS PCT: 0.5 %
EOS PCT: 2.7 %
Eosinophil #: 0.2 10*3/uL (ref 0.0–0.7)
HCT: 35.8 % (ref 35.0–47.0)
HGB: 11.8 g/dL — AB (ref 12.0–16.0)
LYMPHS ABS: 2.8 10*3/uL (ref 1.0–3.6)
Lymphocyte %: 34.8 %
MCH: 28.6 pg (ref 26.0–34.0)
MCHC: 32.9 g/dL (ref 32.0–36.0)
MCV: 87 fL (ref 80–100)
Monocyte #: 0.6 x10 3/mm (ref 0.2–0.9)
Monocyte %: 6.8 %
NEUTROS ABS: 4.5 10*3/uL (ref 1.4–6.5)
Neutrophil %: 55.2 %
PLATELETS: 431 10*3/uL (ref 150–440)
RBC: 4.12 10*6/uL (ref 3.80–5.20)
RDW: 14.5 % (ref 11.5–14.5)
WBC: 8.1 10*3/uL (ref 3.6–11.0)

## 2014-10-31 LAB — BASIC METABOLIC PANEL
ANION GAP: 6 — AB (ref 7–16)
BUN: 12 mg/dL (ref 7–18)
Calcium, Total: 9.1 mg/dL (ref 8.5–10.1)
Chloride: 101 mmol/L (ref 98–107)
Co2: 29 mmol/L (ref 21–32)
Creatinine: 0.97 mg/dL (ref 0.60–1.30)
Glucose: 119 mg/dL — ABNORMAL HIGH (ref 65–99)
Osmolality: 273 (ref 275–301)
Potassium: 2.9 mmol/L — ABNORMAL LOW (ref 3.5–5.1)
SODIUM: 136 mmol/L (ref 136–145)

## 2014-10-31 LAB — TROPONIN I
TROPONIN-I: 0.04 ng/mL
Troponin-I: 0.04 ng/mL
Troponin-I: 0.04 ng/mL

## 2014-11-01 LAB — COMPREHENSIVE METABOLIC PANEL
ALK PHOS: 69 U/L
ALT: 24 U/L
ANION GAP: 8 (ref 7–16)
Albumin: 3.2 g/dL — ABNORMAL LOW (ref 3.4–5.0)
BUN: 18 mg/dL (ref 7–18)
Bilirubin,Total: 0.2 mg/dL (ref 0.2–1.0)
CALCIUM: 8.9 mg/dL (ref 8.5–10.1)
Chloride: 104 mmol/L (ref 98–107)
Co2: 27 mmol/L (ref 21–32)
Creatinine: 0.96 mg/dL (ref 0.60–1.30)
EGFR (Non-African Amer.): >60
Glucose: 106 mg/dL — ABNORMAL HIGH (ref 65–99)
Osmolality: 280 (ref 275–301)
POTASSIUM: 3.4 mmol/L — AB (ref 3.5–5.1)
SGOT(AST): 23 U/L (ref 15–37)
Sodium: 139 mmol/L (ref 136–145)
TOTAL PROTEIN: 7.3 g/dL (ref 6.4–8.2)

## 2014-11-01 LAB — TROPONIN I: TROPONIN-I: 0.03 ng/mL

## 2014-11-03 LAB — BASIC METABOLIC PANEL
Anion Gap: 5 — ABNORMAL LOW (ref 7–16)
BUN: 15 mg/dL (ref 7–18)
CALCIUM: 9.1 mg/dL (ref 8.5–10.1)
CHLORIDE: 106 mmol/L (ref 98–107)
Co2: 31 mmol/L (ref 21–32)
Creatinine: 1.01 mg/dL (ref 0.60–1.30)
EGFR (Non-African Amer.): 57 — ABNORMAL LOW
Glucose: 103 mg/dL — ABNORMAL HIGH (ref 65–99)
OSMOLALITY: 284 (ref 275–301)
Potassium: 3.3 mmol/L — ABNORMAL LOW (ref 3.5–5.1)
SODIUM: 142 mmol/L (ref 136–145)

## 2014-11-23 ENCOUNTER — Ambulatory Visit: Payer: Self-pay | Admitting: Internal Medicine

## 2014-11-23 LAB — CBC CANCER CENTER
BASOS ABS: 0 x10 3/mm (ref 0.0–0.1)
BASOS PCT: 0.5 %
EOS ABS: 0.4 x10 3/mm (ref 0.0–0.7)
Eosinophil %: 5.9 %
HCT: 35.4 % (ref 35.0–47.0)
HGB: 11.4 g/dL — ABNORMAL LOW (ref 12.0–16.0)
LYMPHS PCT: 30.1 %
Lymphocyte #: 1.8 x10 3/mm (ref 1.0–3.6)
MCH: 28.1 pg (ref 26.0–34.0)
MCHC: 32.2 g/dL (ref 32.0–36.0)
MCV: 87 fL (ref 80–100)
MONOS PCT: 7.3 %
Monocyte #: 0.4 x10 3/mm (ref 0.2–0.9)
NEUTROS ABS: 3.4 x10 3/mm (ref 1.4–6.5)
NEUTROS PCT: 56.2 %
PLATELETS: 383 x10 3/mm (ref 150–440)
RBC: 4.05 10*6/uL (ref 3.80–5.20)
RDW: 15.4 % — ABNORMAL HIGH (ref 11.5–14.5)
WBC: 6 x10 3/mm (ref 3.6–11.0)

## 2014-11-23 LAB — HEPATIC FUNCTION PANEL A (ARMC)
ALBUMIN: 3.7 g/dL (ref 3.4–5.0)
ALK PHOS: 78 U/L
ALT: 37 U/L
BILIRUBIN DIRECT: 0.1 mg/dL (ref 0.0–0.2)
Bilirubin,Total: 0.3 mg/dL (ref 0.2–1.0)
SGOT(AST): 26 U/L (ref 15–37)
TOTAL PROTEIN: 7.7 g/dL (ref 6.4–8.2)

## 2014-11-23 LAB — CANCER ANTIGEN 27.29: CA 27-29: 15.3

## 2014-11-23 LAB — CREATININE, SERUM
CREATININE: 1 mg/dL (ref 0.60–1.30)
EGFR (African American): >60
EGFR (Non-African Amer.): 57 — ABNORMAL LOW

## 2014-11-24 LAB — CANCER ANTIGEN 27.29: CA 27.29: 15.3 U/mL (ref 0.0–38.6)

## 2014-12-23 ENCOUNTER — Ambulatory Visit: Payer: Self-pay | Admitting: Internal Medicine

## 2015-03-28 DIAGNOSIS — R609 Edema, unspecified: Secondary | ICD-10-CM | POA: Diagnosis not present

## 2015-04-14 NOTE — Op Note (Signed)
PATIENT NAME:  Tina Johnston, LECY MR#:  [id number] DATE OF BIRTH:  [birth year]  DATE OF PROCEDURE:  07/15/2013  PREOPERATIVE DIAGNOSIS: Carcinoma of breast, bilateral.   POSTOPERATIVE DIAGNOSIS: Carcinoma of breast, bilateral.   OPERATION: 1.  Left breast lumpectomy with ultrasound-guided wire localization and sentinel node biopsy.  2.  Right breast lumpectomy with sentinel node biopsy.   SURGEON: Mckinley Jewel, M.D.   ANESTHESIA: General.   COMPLICATIONS: None.   ESTIMATED BLOOD LOSS: 25 to 50 mL.   DRAINS: None.   DESCRIPTION OF PROCEDURE: This patient had 2 foci of carcinoma in the upper left breast at 12 o'clock very close to each other. 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 On the right side, at 9 o'clock, she had another tiny focus of invasive cancer. Preoperatively, the patient underwent nuclear contrast injection for sentinel node identification and signal activity in both axillae was fairly strong for identification. The patient was put to sleep in the supine position on the operating table. The left side was operated on first. The left breast was prepped and draped out as a sterile field. The gamma finder was used to localize signal activity in the anterior portion of the medial aspect of the left axilla, and a transverse skin incision was made at the site and carefully deepened through to the axillary fat pad, bleeding controlled with cautery and ligatures of 3-0 Vicryl. With the use of the gamma finder, 3 nodes were identified, somewhat fat replaced, and they all had signal activity and they were removed and sent off as sentinel nodes 1, 2 and 3. There were no other palpable nodes and the signal activity died completely after removal of these 3.  After ensuring hemostasis, the deeper tissues were closed in 2 layers with 2-0 Vicryl and the skin with subcuticular 4-0 Vicryl. Frozen section on these 3 nodes showed no evidence of macrometastases. The patient's primary lesions were very  close to each other, were located around the 12 o'clock position. An ultrasound probe was used at this time, and the biopsy cavities, 1 superior and 1 inferior, were identified at this site, and with a tiny stab incision a Bard ultra-wire was placed going through the middle of this and anchored in place. Following this, a transverse curvilinear incision was made overlying this area from the wire entrance site medially and deepened through the subcutaneous tissue. The skin and subcutaneous tissue was elevated partially on both sides, and with the wire as a guide a generous portion of the breast tissue was excised out which included the palpable firm biopsy cavities. The specimen was tagged for margins and sent to x-ray showing the presence of the clips and then sent to pathology. Initial margin assessment suggested that the closest being about 0.5 cm. After ensuring hemostasis, the glandular tissue was approximated with 2-0 Vicryl and the skin closed with subcuticular 4-0 Vicryl and Dermabond was applied on both the incisions, in the breast and the axilla. At this point, the drapes were removed. Gowns and gloves were changed. The right breast was then prepped and draped out as a sterile field. Similarly, the sentinel node was dealt with first. Again, the activity was located anteriorly in the medial portion of the axilla. Transverse incision was made. Here 2 nodes were identified with signal activity and sent off as sentinel nodes 1 and 2 with no other nodes identified.  On frozen section, there was no evidence of macrometastases. Closure was obtained similar to the left side.  The  lumpectomy on the right side was performed at the 9 o'clock position with a radial incision. The ultrasound probe was used to localize the area of the biopsy cavity, and the skin was marked and the skin incision was made. The glandular tissue was then excised out containing the biopsy cavity with adequate margins all around. The margins  were tagged and sent to pathology for inking.  Closure was obtained in a similar fashion as to the left side. Dermabond was applied. Procedure was well tolerated and the patient subsequently returned to the recovery room in stable condition.  ____________________________ S.Robinette Haines, MD sgs:sb D: 07/16/2013 07:35:43 ET T: 07/16/2013 07:46:38 ET JOB#: [id number]  cc: Synthia Innocent. Jamal Collin, MD, <Dictator> [facility] Robinette Haines MD ELECTRONICALLY SIGNED 07/19/2013 7:09

## 2015-04-14 NOTE — Op Note (Signed)
PATIENT NAME:  Tina Johnston, Tina Johnston MR#:  [id number] DATE OF BIRTH:  04/24/1941  DATE OF PROCEDURE:  07/30/2013  PREOPERATIVE DIAGNOSIS: Bilateral breast carcinoma post lumpectomy.  POSTOPERATIVE  DIAGNOSIS: Bilateral breast carcinoma post lumpectomy.  OPERATION: Re-excision lumpectomy in the left breast and right breast for margins.   SURGEON: S.G. Jamal Collin, MD  ANESTHESIA: General.   COMPLICATIONS: None.   ESTIMATED BLOOD LOSS: Minimal.   DRAINS: None.   DESCRIPTION OF PROCEDURE: The patient was put to sleep in the supine position on the operating table. Both breasts were prepped and draped out as a sterile field. The patient underwent a lumpectomy for 2 areas of cancer, one in the left breast superiorly and one in the right breast in the upper outer aspect. The one on the right breast had a positive margin on the inferior aspect, and the one on the left breast had a positive margin on the medial aspect, and these two areas were re-excised today. The right breast was operated on first. The previous incision was reopened and carefully deepened through into the lumpectomy cavity, which was then outlined after removal of the seroma fluid. The inferior margin was adequately exposed, and a 1 cm thick area along the inferior aspect of the lumpectomy site was excised out, and the outer edge of this was marked as the new margin and sent to pathology. After ensuring hemostasis, the deeper tissues were closed with 2-0 Vicryl and the skin with subcuticular 4-0 Vicryl. The left breast was similarly dealt with. The previous incision was reopened, the lumpectomy cavity was entered into, and a good portion of the medial wall a centimeter thick was excised out and the new margin tagged and sent to pathology. Closure was obtained with 2-0 Vicryl in the deeper tissues and the skin with subcuticular 4-0 Vicryl. Both incisions were covered with Dermabond. The procedure was well tolerated. She was subsequently returned to  the recovery room in stable condition.   ____________________________ S.Robinette Haines, MD sgs:OSi D: 08/04/2013 10:08:13 ET T: 08/04/2013 11:23:01 ET JOB#: [id number]  cc: Synthia Innocent. Jamal Collin, MD, <Dictator> [facility] Robinette Haines MD ELECTRONICALLY SIGNED 08/05/2013 6:56

## 2015-04-14 NOTE — Consult Note (Signed)
Reason for Visit: This 74 year old Female patient presents to the clinic for initial evaluation of  bilateral breast cancer .   Referred by Dr. Jamal Collin.  Diagnosis:  Chief Complaint/Diagnosis   left breaststage I (T1 B. N0 M0) ER/PR positive HER-2/neu negative breast stage I invasive mammary carcinoma (T1 C. N0 M0) ER/PR positive HER-2/neu not overexpressed  HPI   patient is a 74 year old femalewho presented with new area of cluster microcalcifications bilaterally. She went on to have an MRI showed a focus abnormality and bilateral breasts. She went on to have bilateral wide local excisions. Left breast showed 2 separate foci of invasive mammary carcinoma both 0.6 cm. 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 Tumor was grade 1. 4 sentinel lymph nodes were negative. Tumor was ER/PR positive HER-2/neu not overexpressed. Overall a T1 B. lesion. Her right breast had a single foci of 1.1 cm overall grade 1. 2 sentinel lymph nodes were negative. Tumor again was ER/PR positive. Both specimens margins were positive she underwent reexcision with negative margins. 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 She has done well postoperatively. Her right breast Oncotype DX has been ordered and is pending. She is now referred to radiation oncology for opinion. She is doing well. She's recovered nicely from her surgery. No specic complaints.  Past Hx:    Hypercholesterolemia:    HTN:    Arthritis:    Renal Insufficiency:    Bilateral Breast Cancer: 2014   Bilateral Sentinel Node Biopsy, Bilateral Lumpectomy: Jul 2014   Hysterectomy:   Past, Family and Social History:  Past Medical History positive   Cardiovascular hyperlipidemia; hypertension   Genitourinary renal insufficiency   Past Surgical History bilateral lumpectomies, hysterectomy   Past Medical History Comments arthritis   Family History positive   Family History  Comments mother with breast cancer   Social History noncontributory   Additional Past Medical and Surgical History accompanied by her sister today and nurse navigator   Allergies:   No Known Allergies:   Home Meds:  Home Medications: Medication Instructions Status  Tylenol Caplet Extra Strength 500 mg oral tablet 1 tab(s) orally every 6 hours, As Needed Active  Benadryl 25 mg oral tablet 1 tab(s) orally 3 times a day, As Needed - for Itching Active  meloxicam 15 mg oral tablet 1 tab(s) orally once a day Active  potassium chloride 10 mEq oral capsule, extended release 1 cap(s) orally 2 times a day Active  alleviate 1 tab(s) orally 2 times a day Active  Calcium 600+D 800 IU 1 tab(s) orally 2 times a day Active  hydrochlorothiazide-triamterene 25 mg-37.5 mg oral capsule 1 cap(s) orally once a day Active  simvastatin 20 mg oral tablet 1 tab(s) orally once a day (at bedtime) Active  alive 1 tab(s) orally once a day Active   Review of Systems:  General negative   Performance Status (ECOG) 0   Skin negative   Breast see HPI   Ophthalmologic negative   ENMT negative   Respiratory and Thorax negative   Cardiovascular negative   Gastrointestinal negative   Genitourinary negative   Musculoskeletal negative   Neurological negative   Psychiatric negative   Hematology/Lymphatics negative   Endocrine negative   Allergic/Immunologic negative   Nursing Notes:  Nursing Vital Signs and Chemo Nursing Nursing Notes: *CC Vital Signs Flowsheet:   26-Aug-14 09:27  Temp Temperature 97.5  Pulse Pulse 71  Respirations Respirations 18  SBP SBP 168  DBP DBP 111  Pain Scale (0-10)  0  Current Weight (kg) (kg) 68.7   Physical Exam:  General/Skin/HEENT:  General normal   Skin normal   Eyes normal   ENMT normal   Head and Neck normal   Additional PE well-developed well-nourished female in NAD. Lungs are clear to A&P cardiac examination shows regular rate and rhythm.  She status post bilateral wide local excisions of either breast. She also bilateral sentinel node biopsy scars oral healing well. No dominant mass or nodularity is noted in either breast into position examined.no axillary or supraclavicular adenopathy is appreciated   Breasts/Resp/CV/GI/GU:  Respiratory and Thorax normal   Cardiovascular normal   Gastrointestinal normal   Genitourinary normal   MS/Neuro/Psych/Lymph:  Musculoskeletal normal   Neurological normal   Lymphatics normal   Other Results:  Radiology Results: [place]:    27-May-13 14:18, US Breast Left  US Breast Left   REASON FOR EXAM:    AV LT MASS  COMMENTS:    PROCEDURE:     [place]  - US BREAST LEFT  - May 18 2012  2:18PM    RESULT:    COMPARISON:  05/08/2012, 05/07/2011, 05/01/2010.    FINDINGS:    True lateral and spot compression magnification views were performed to   evaluate the small mass in the anterior aspect of the left breast. These   views demonstrate this area to persist and assume the appearance of a   round mass. The borders were well circumscribed. Some of the borders were     obscured by overlying fibroglandular tissue. The mass is located in the   superior lateral aspect of the anterior left breast. On the initial spot   compression magnification MLO image, there was a small, round asymmetry   in the superior aspect of the image. Additional spot compression   magnification images were performed of the this region which showed this   area to efface and assume the appearance of normal fibroglandular tissue,   that was similar to prior studies.  There are scattered calcifications in   the left breast which are seen on these images. These calcifications are   round and are similar to prior studies.    Real-time ultrasound was performed of the superior lateral left breast   from 1 o'clock to 2 o'clock. At 2 o'clock, 3 cm from the nipple, there   was an anechoic, oval mass with acoustic through  transmission. This is   felt to correlate with the mass on the mammograms. It measures 0.9 x 0.7   x 0.9 cm. This is consistent with a benign simple cyst.   IMPRESSION:    BI-RADS: Category 2 - Benign Findings    The mass in the superolateral left breast is consistent with a benign   simple cyst. Recommend continued annual screening mammography.    Thank you for this opportunity to contribute to the care of your patient.           Verified TT:SVXBLT L. SUBER, M.D., MD  LabUnknown:    20-May-14 14:15, Digital Additional Views Bilateral  PACS [place]:    27-May-13 13:21, Digital Additional Views Lt Breast (SCR)  Digital Additional Views Lt Breast (SCR)   REASON FOR EXAM:    AV LT MASS  COMMENTS:    PROCEDURE:     MAM - MAM DGTL ADD VW LT  SCR  - May 18 2012  1:21PM  RESULT:    COMPARISON:  05/08/2012, 05/07/2011, 05/01/2010.    FINDINGS:    True lateral and spot compression magnification views were performed to   evaluate the small mass in the anterior aspect of the left breast. These   views demonstrate this area to persist and assume the appearance of a   round mass. The borders were well circumscribed. Some of the borders were     obscured by overlying fibroglandular tissue. The mass is located in the   superior lateral aspect of the anterior left breast. On the initial spot   compression magnification MLO image, there was a small, round asymmetry   in the superior aspect of the image. Additional spot compression   magnification images were performed of the this region which showed this   area to efface and assume the appearance of normal fibroglandular tissue,   that was similar to prior studies.  There are scattered calcifications in   the left breast which are seen on these images. These calcifications are   round and are similar to prior studies.    Real-time ultrasound was performed of the superior lateral left breast   from 1 o'clock to 2 o'clock.  At 2 o'clock, 3 cm from the nipple, there   was an anechoic, oval mass with acoustic through transmission. This is   felt to correlate with the mass on the mammograms. It measures 0.9 x 0.7   x 0.9 cm. This is consistent with a benign simple cyst.   IMPRESSION:    BI-RADS: Category 2 - Benign Findings    The mass in the superolateral left breast is consistent with a benign   simple cyst. Recommend continued annual screening mammography.    Thank you for this opportunity to contribute to the care of your patient.    A NEGATIVEMAMMOGRAM REPORT DOES NOT PRECLUDE BIOPSY OR OTHER EVALUATION   OF A CLINICALLY PALPABLE OR OTHERWISE SUSPICIOUS MASS OR LESION. BREAST   CANCER MAY NOT BE DETECTED BY MAMMOGRAPHY IN UP TO 10% OF CASES.           Verified By: Gregor Hams, M.D., MD    20-May-14 14:15, Digital Additional Views Bilateral  Digital Additional Views Bilateral   REASON FOR EXAM:    av bil asymmetry and lt calcs  COMMENTS:       PROCEDURE: MAM - MAM DGTL  ADD VW  BIL SCR  - May 11 2013  2:15PM       RESULT:     Comparisons: 05/08/2012, 05/07/2011, 05/01/2010, 05/10/2013, and 04/24/2009.    Findings:  True lateral and spot compression magnification views were performed to   evaluate the findings in the left breast on the screening mammograms. At   approximately 12 o'clock in the left breast there is a cluster of   microcalcifications. These calcifications are predominantly round in     morphology although there are some amorphous calcifications. There   appears to be an associated mild density at this site. Just anteriorly at   approximately 12 o'clock the small round asymmetry appears to persist on   the spot compression magnification views and is concerning for a small   mass with ill-defined borders. There is suggestion of subtle   architectural distortion. It measures approximately 6 mm. The small   asymmetry questioned in the lateral aspect of the left CC view appears to    efface and assume the appearance of normal fibroglandular tissue. On the   spot compression  magnification images of the left breast in the anterior   depth there is a small round mass which is previously shown to represent   acyst.    The small asymmetry questioned in the lateral aspect of the right CC view   appears to efface and assume the appearance of normal fibroglandular   tissue.   Real-time ultrasound was performed of the superior left breast from 12   o'clock through 3 o'clock. At 12 o'clock approximately 2 cm from the   nipple there is a small hypoechoic mass with ill-defined borders. There   is some posterior acoustic shadowing. It measures 0.5 x 0.5 x 0.4 cm. It   is uncertain if this correlates with the mass questioned on the   mammograms versus the area of calcifications with associated density on   the mammograms. At 1 o'clock there is an anechoic mass with acoustic   through transmission that is consistent with a cyst as seen on the prior   ultrasound. It measures 1.1 cm in greatest dimension.     IMPRESSION:     BI-RADS: Category 4 - Suspicious Abnormality.      Surgical consultation and tissue diagnosis are recommended for the     cluster of microcalcifications in the superior left breast as well as the   findings concerning for a small mass separately in the superior left   breast. It is uncertain which of these two findings correlates with the   small mass seen on ultrasound. Tissue diagnosis could be attempted with   stereotactic-guided biopsy for the cluster of microcalcifications in the   superior left breast as well as the findings concerning for a separate   small mass.    A NEGATIVE MAMMOGRAM REPORT DOES NOT PRECLUDE BIOPSY OR OTHER EVALUATION   OF A CLINICALLY PALPABLE OR OTHERWISE SUSPICIOUS MASS OR LESION. BREAST   CANCER MAY NOT BE DETECTED BY MAMMOGRAPHY IN UP TO 10% OF CASES.    Thank you for the opportunity to contribute to the care of your  patient.       Verified By: Gregor Hams, M.D., MD   Relevent Results:   Relevant Scans and Labs mammograms are reviewed   Assessment and Plan: Impression:   bilateral breast cancer as described above with Oncotype DX pending. Both early stage disease in 74 year old female. 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 I believe will be best to treat bilateral breasts to 5000 cGy boosting or scar sites another 1600 cGy using electron beam. We can coordinate in duties at the same time with minimal additional risk profile. We will wait Oncotype DX recurrence score before proceeding since there may be an indication for systemic chemotherapy. Risks and benefits of treatment including irritation of the skin, fatigue, inclusion of some superficial lung, and alteration of blood counts were all described in detail to both the patient and her sister. They both seem to comprehend our treatment plan well. CT simulation was set up after Labor Day weekend.  I would like to take this opportunity to thank you for allowing me to continue to participate in this patient's care.  CC Referral:  cc: Dr. Vickki Muff, Dr. Jamal Collin   Electronic Signatures: Baruch Gouty, Roda Shutters (MD)  (Signed 26-Aug-14 13:11)  Authored: HPI, Diagnosis, Past Hx, PFSH, Allergies, Home Meds, ROS, Nursing Notes, Physical Exam, Other Results, Relevent Results, Encounter Assessment and Plan, CC Referring Physician  Last Updated: 26-Aug-14 13:11 by Armstead Peaks (MD)

## 2015-04-15 NOTE — Discharge Summary (Signed)
Dates of Admission and Diagnosis:  Date of Admission 31-Oct-2014   Date of Discharge 03-Nov-2014   Admitting Diagnosis Dizziness, recent syncopal episode on 10/27/14, hypokalemia   Final Diagnosis Dizziness, recent syncopal episode on 10/27/14, hypokalemia   Discharge Diagnosis 1 Dizziness, recent syncopal episode on 10/27/14   2 Hypertension   3 Hyperlipidemia    Chief Complaint/History of Present Illness 74 year old lady admitted for dizziness and syncopal episode on Thursday 10/27/14. CT head was negative for acute abnormality. Initial labs were remarkable for low potassium level of 2.9 on admission   Allergies:  No Known Allergies:     Cardiology:  09-Nov-15 12:01   ECG interpretation Normal sinus rhythm Possible Left atrial enlargement Incomplete right bundle branch block Borderline ECG When compared with ECG of 08-Jul-2013 09:49, No significant change was found ----------unconfirmed---------- Confirmed by OVERREAD, NOT ([id number]), editor PEARSON, BARBARA ([id number]) on 11/01/2014 3:03:52 PM    18:48   Echo Doppler REASON FOR EXAM:     COMMENTS:     PROCEDURE: [facility] - ECHO DOPPLER COMPLETE(TRANSTHOR)  - Oct 31 2014  6:48PM   RESULT: Echocardiogram Report  Patient Name:   Tina Johnston Date of Exam: 10/31/2014 Medical Rec #:  [id number]           Custom1: Date of Birth:  [birth year]        Height:       63.0 in Patient Age:    51 years         Weight:       146.0 lb Patient Gender: F                BSA:          1.69 m??  Indications: Syncope Sonographer:    Cindy Hazy RDCS Referring Phys: Abel Presto, J  Sonographer Comments: Technically difficult study due to poor echo  windows.  Summary:  1. Left ventricular ejection fraction, by visual estimation, is 55 to  60%.  2. Normal global left ventricular systolic function.  3. Impaired relaxation pattern of LV diastolic filling.  4. Mild left ventricular hypertrophy.  5. Decreased left ventricular internal cavity  size.  6. Mild mitral valve regurgitation.  7. Mildly increased left ventricular posterior wall thickness.  8. Mild tricuspid regurgitation. 2D AND M-MODE MEASUREMENTS (normal ranges within parentheses): Left Ventricle:          Normal IVSd (2D):      1.81 cm (0.7-1.1) LVPWd (2D):     1.27 cm (0.7-1.1) Aorta/LA:                  Normal LVIDd (2D):     2.57cm (3.4-5.7) Aortic Root (2D): 3.00 cm (2.4-3.7) LVIDs (2D):     1.90 cm           Left Atrium (2D): 2.60 cm (1.9-4.0) LV FS (2D):     26.1 %   (>25%) LV EF (2D):     53.3 %   (>50%)                                   Right Ventricle:                       RVd (2D): LV DIASTOLIC FUNCTION: MV Peak E: 0.86 m/s E/e' Ratio: 10.50 MV Peak A: 1.18 m/s Decel Time: 215 msec E/A Ratio: 0.73 SPECTRAL DOPPLER ANALYSIS (where applicable):  Mitral Valve: MV P1/2 Time: 62.35 msec MV Area, PHT: 3.53 cm?? Aortic Valve: AoV Max Vel: 1.87 m/s AoV Peak PG: 14.0 mmHg AoV Mean PG: LVOT Vmax: 1.38 m/s LVOT VTI: 0.208 m LVOT Diameter: 1.90 cm AoV Area, Vmax: 2.09 cm?? AoV Area, VTI:  AoV Area, Vmn: Pulmonic Valve: PV Max Velocity: 1.48 m/sPV Max PG: 8.8 mmHg PV Mean PG:  PHYSICIAN INTERPRETATION: Left Ventricle: The left ventricular internal cavity size was decreased.  LV posterior wall thickness was mildly increased. Mild left ventricular  hypertrophy. Global LV systolic function was normal. Left ventricular  ejection fraction, by visual estimation, is 55 to 60%. Spectral Doppler  shows impaired relaxation pattern of LV diastolic filling. Right Ventricle: The right ventricular size is normal. Left Atrium: The left atriumis normal in size. Right Atrium: The right atrium is normal in size. Pericardium: There is no evidence of pericardial effusion. Mitral Valve: Mild mitral valve regurgitation is seen. Tricuspid Valve: Mild tricuspid regurgitation is visualized. Aortic Valve: No evidence of aortic valve regurgitation is seen. Aorta: The aortic root is  normal in size and structure.  [place] MD Electronically signed by [id number] Isaias Cowman MD Signature Date/Time: 11/01/2014/12:44:37 PM  *** Final *** IMPRESSION: .    Verified BySheppard Coil . PARASCHOS, M.D., MD  Routine Chem:  09-Nov-15 12:03   Glucose, Serum  119  BUN 12  Creatinine (comp) 0.97  Sodium, Serum 136  Potassium, Serum  2.9  Chloride, Serum 101  CO2, Serum 29  Calcium (Total), Serum 9.1  Anion Gap  6 (Result(s) reported on 31 Oct 2014 at 12:32PM.)  Osmolality (calc) 273  10-Nov-15 01:25   Potassium, Serum  3.4  12-Nov-15 03:42   Potassium, Serum  3.3  Cardiac:  09-Nov-15 12:03   Troponin I 0.04 (0.00-0.05 0.05 ng/mL or less: NEGATIVE  Repeat testing in 3-6 hrs  if clinically indicated. >0.05 ng/mL: POTENTIAL  MYOCARDIAL INJURY. Repeat  testing in 3-6 hrs if  clinically indicated. NOTE: An increase or decrease  of 30% or more on serial  testing suggests a  clinically important change)    17:52   Troponin I 0.04 (0.00-0.05 0.05 ng/mL or less: NEGATIVE  Repeat testing in 3-6 hrs  if clinically indicated. >0.05 ng/mL: POTENTIAL  MYOCARDIAL INJURY. Repeat  testing in 3-6 hrs if  clinically indicated. NOTE: An increase or decrease  of 30% or more on serial  testing suggests a  clinically important change)    21:52   Troponin I 0.04 (0.00-0.05 0.05 ng/mL or less: NEGATIVE  Repeat testing in 3-6 hrs  if clinically indicated. >0.05 ng/mL: POTENTIAL  MYOCARDIAL INJURY. Repeat  testing in 3-6 hrs if  clinically indicated. NOTE: An increase or decrease  of 30% or more on serial  testing suggests a  clinically important change)  10-Nov-15 01:25   Troponin I 0.03 (0.00-0.05 0.05 ng/mL or less: NEGATIVE  Repeat testing in 3-6 hrs  if clinically indicated. >0.05 ng/mL: POTENTIAL  MYOCARDIAL INJURY. Repeat  testing in 3-6 hrs if  clinically indicated. NOTE: An increase or decrease  of 30% or more on serial  testing  suggests a  clinically important change)  Routine Hem:  09-Nov-15 12:03   WBC (CBC) 8.1  RBC (CBC) 4.12  Hemoglobin (CBC)  11.8  Hematocrit (CBC) 35.8  Platelet Count (CBC) 431  MCV 87  MCH 28.6  MCHC 32.9  RDW 14.5  Neutrophil % 55.2  Lymphocyte % 34.8  Monocyte % 6.8  Eosinophil % 2.7  Basophil % 0.5  Neutrophil # 4.5  Lymphocyte # 2.8  Monocyte # 0.6  Eosinophil # 0.2  Basophil # 0.0 (Result(s) reported on 31 Oct 2014 at 12:32PM.)   PERTINENT RADIOLOGY STUDIES: [place]:    09-Nov-15 15:30, US Carotid Doppler Bilateral  US Carotid Doppler Bilateral   REASON FOR EXAM:    Syncope.  COMMENTS:       PROCEDURE: [place]  - US CAROTID DOPPLER BILATERAL  - Oct 31 2014  3:30PM     CLINICAL DATA:  Syncope    EXAM:  BILATERAL CAROTID DUPLEX ULTRASOUND    TECHNIQUE:  Pearline Cables scale imaging, color Doppler and duplex ultrasound were  performed of bilateral carotid and vertebral arteries in the neck.    COMPARISON:  None.  FINDINGS:  Criteria: Quantification of carotid stenosis is based on velocity  parameters that correlate the residual internal carotid diameter  with NASCET-based stenosis levels, using the diameter of the distal  internal carotid lumen as the denominator for stenosis measurement.    The following velocity measurements were obtained:    RIGHT    ICA:  83/26 cm/sec    CCA:  086/57 cm/sec    SYSTOLIC ICA/CCA RATIO:  0.8  DIASTOLIC ICA/CCA RATIO:  1.3    ECA:  80 cm/sec    LEFT    ICA:  81/20 cm/sec    CCA:  84/69 cm/sec    SYSTOLIC ICA/CCA RATIO:  0.9    DIASTOLIC ICA/CCA RATIO:  1.2    ECA:  87 cm/sec  RIGHT CAROTID ARTERY: No significant plaque formation. Minor intimal  thickening. No hemodynamically significant right ICA stenosis,  velocity elevation, or turbulent flow. Degree of narrowing less than  50%.    RIGHT VERTEBRAL ARTERY:  Antegrade    LEFT CAROTID ARTERY: Similar scattered minor echogenic plaque  formation. No hemodynamically  significant left ICA stenosis,  velocity elevation, or turbulent flow.    LEFT VERTEBRAL ARTERY:  Antegrade     IMPRESSION:  Minor carotid atherosclerosis. No hemodynamically significant ICA  stenosis. Degree of narrowing less than 50% bilaterally.      Electronically Signed    By: Daryll Brod M.D.    On: 10/31/2014 15:36         Verified By: Earl Gala, M.D.,  CT:    09-Nov-15 12:34, CT Head Without Contrast  CT Head Without Contrast   REASON FOR EXAM:    dizziness  COMMENTS:       PROCEDURE: CT  - CT HEAD WITHOUT CONTRAST  - Oct 31 2014 12:34PM     CLINICAL DATA:  Dizziness    EXAM:  CT HEAD WITHOUT CONTRAST    TECHNIQUE:  Contiguous axial images were obtained from the base of theskull  through the vertex without intravenous contrast.    COMPARISON:  None  FINDINGS:  Minimal atrophy.    Normal ventricular morphology.    No midline shift or mass effect.    Minimal small vessel chronic ischemic changes of deep cerebral white  matter.    No acute intra cranial hemorrhage, mass lesion, or evidence acute  infarction.    No extra-axial fluid collections.  Small air-fluid level within sphenoid sinus.    Atherosclerotic calcifications at carotid siphons.    Bones and sinuses otherwise unremarkable.     IMPRESSION:  Atrophy with minimal small vessel chronic ischemic changes of deep  cerebral white matter.    No acute intracranial abnormalities.      Electronically Signed  By: Lavonia Dana M.D.    On: 10/31/2014 13:08         Verified By: Burnetta Sabin, M.D.,   Pertinent Past History:  Pertinent Past History Hypertension Hyperlipidemia   Hospital Course:  Hospital Course 74 year old lady admitted for dizziness and syncopal episode on Thursday 10/27/14. CT head was negative for acute abnormality. Echo revealed normal LV function, mild LVH, EF=55-60%. EKG noted. Serial cardiac enzymes were negative. Carotid doppler was negative for  hemodynamically significant stenosis. She received Antivert 25 mg TID. Hypokalemia: initial potassium level of 2.9, improved to 3.4, s/p IV supplementation. Hypertension remained well controlled. Diuretic was decreased to HCTZ 12.5 mg daily. High dose diuretic, Triamterene/HCTZ was like contributing to her dizziness. Monitor BP, may increase Norvasc to 10 mg daily if needed to maintain optimal BP control. Simvastatin was continued for hyperlipidemia. No c/o dizziness today. She wants to go home. Patient was observed ambulating in the room without any difficulty. HENT: no JVD, Chest: clear, CVS: RRR, Abdo: benign, Ext: no edema, Neuro: non-focal.   Condition on Discharge Stable   DISCHARGE INSTRUCTIONS HOME MEDS:  Medication Reconciliation: Patient's Home Medications at Discharge:     Medication Instructions  tylenol caplet extra strength 500 mg oral tablet  1 tab(s) orally every 6 hours, As Needed   meloxicam 15 mg oral tablet  1 tab(s) orally once a day, As Needed - for Pain   potassium chloride 10 meq oral capsule, extended release  1 cap(s) orally 2 times a day   anastrozole 1 mg oral tablet  1 tab(s) orally once a day   simvastatin 20 mg oral tablet  1 tab(s) orally once a day   calcium 600+d 600 mg-800 intl units oral tablet  1 tab(s) orally 2 times a day   amlodipine 5 mg oral tablet  1 tab(s) orally once a day   meclizine 25 mg oral tablet  1 tab(s) orally 3 times a day   hydrochlorothiazide 12.5 mg oral tablet  1 tab(s) orally once a day. STOP TRIAMTERENE/HCTZ    STOP TAKING THE FOLLOWING MEDICATION(S):    alleviate: 1 tab(s) orally 2 times a day, As Needed calcium 600+d 800 iu: 1 tab(s) orally 2 times a day hydrochlorothiazide-triamterene 25 mg-37.5 mg oral capsule: 1 cap(s) orally once a day  Physician's Instructions:  Diet Low Sodium  Low Fat, Low Cholesterol   Activity Limitations As tolerated   Return to Work Not Applicable   Time frame for Follow Up Appointment 1-2  weeks     MCLAUGHLIN, MIRIAM(Family Physician):   Electronic Signatures: Glendon Axe (MD)  (Signed 12-Nov-15 13:31)  Authored: ADMISSION DATE AND DIAGNOSIS, CHIEF COMPLAINT/HPI, Allergies, PERTINENT LABS, PERTINENT RADIOLOGY STUDIES, PERTINENT PAST HISTORY, HOSPITAL COURSE, DISCHARGE INSTRUCTIONS HOME MEDS, PATIENT INSTRUCTIONS, Follow Up Physician   Last Updated: 12-Nov-15 13:31 by Glendon Axe (MD)

## 2015-04-15 NOTE — H&P (Signed)
PATIENT NAME:  Tina Johnston, Tina Johnston MR#:  [id number] DATE OF BIRTH:  [birth year]  DATE OF ADMISSION:  10/31/2014  PRIMARY CARE PHYSICIAN: Boykin Reaper, PA-C.    CHIEF COMPLAINT: Dizziness and syncope.   HISTORY OF PRESENT ILLNESS: This is a 74 year old female who presents to the Emergency Room from [facility] due to persistent dizziness and syncopal episode. The patient said that she had a syncopal episode on Thursday where she blacked out. She lives by herself, therefore it was not witnessed. She cannot recall how much time had expired between the time she passed out and when she woke up on the floor. She came to urgent care on Friday and they treated her for sinusitis and bronchitis. She also has been feeling persistently dizzy since then. She went to see her primary care physician today and almost had had a presyncopal episode and felt dizzy there and therefore was sent to the ER for further evaluation. She denied any chest pain, palpitations, nausea, vomiting, abdominal pain, fevers, chills, cough, or any other associated symptoms presently along with the dizziness. Given the persistence of her symptoms hospitalist services were contacted for further treatment and evaluation.   REVIEW OF SYSTEMS:  CONSTITUTIONAL: No documented fever. No weight gain, no weight loss.  EYES: No blurry or double vision.  EARS, NOSE, AND THROAT: No tinnitus. No postnasal drip. No redness of the oropharynx.  RESPIRATORY: No cough, no wheeze, no hemoptysis, no dyspnea.  CARDIOVASCULAR: No chest pain, orthopnea. No palpitations. Positive syncope.  GASTROINTESTINAL: No nausea, no vomiting, no diarrhea, abdominal pain. No melena or hematochezia.  GENITOURINARY: No dysuria, no hematuria.  ENDOCRINE: No polyuria or nocturia. No heat or cold intolerance.  HEMATOLOGY: No anemia. No bruising. No bleeding.  INTEGUMENTARY: No rashes or lesions.  MUSCULOSKELETAL: No arthritis.  No swelling.  No gout.  NEUROLOGIC: No  numbness or tingling. No ataxia. No seizure activity.  PSYCHIATRIC: No anxiety. No insomnia. No ADD.   PAST MEDICAL HISTORY: Consistent with history of breast cancer, hypertension, osteoarthritis, osteoporosis, hyperlipidemia.   ALLERGIES: No known drug allergies.   SOCIAL HISTORY: No smoking. No alcohol abuse. No illicit drug abuse. Lives at home by herself.   FAMILY HISTORY: Mother and father both deceased. Father died from lung cancer. Mother died from congestive heart failure.   CURRENT MEDICATIONS: Amlodipine 5 mg daily, anastrozole 1 mg daily, calcium and vitamin D 1 tab b.i.d., hydrochlorothiazide/triamterene 25/37.5 one tablet daily, meloxicam 15 mg daily as needed, potassium 10 mEq b.i.d., simvastatin 20 mg daily, Tylenol 500 mg q. 6 hours as needed.   PHYSICAL EXAMINATION:  VITAL SIGNS:  Temperature 97.5, pulse 78, respirations 18, blood pressure is 165/81, saturation is 98% on room air GENERAL: The patient is a pleasant-appearing female in no apparent distress.  HEAD, EYES, EARS, NOSE, AND THROAT: Atraumatic, normocephalic. Extraocular muscles are intact. Pupils equal and reactive on to light. Sclerae anicteric. No conjunctival injection. No pharyngeal erythema.  NECK: Supple. There is no jugular venous distention.   No bruits. No lymphadenopathy. No thyromegaly.  HEART: Regular rate and rhythm. No murmurs, no rubs, no clicks.  LUNGS: Clear to auscultation bilaterally. No rales or rhonchi. No wheezes.  ABDOMEN: Soft, flat, nontender, nondistended. Has good bowel sounds. No hepatosplenomegaly appreciated.  EXTREMITIES: No evidence of any cyanosis, clubbing, or peripheral edema. Has + 2 pedal and radial pulses bilaterally.  NEUROLOGICAL: The patient is alert, awake, and oriented x 3 with no focal motor or sensory deficits appreciated bilaterally.  SKIN: Moist and warm with  no rashes appreciated.  LYMPHATIC: There is no cervical or axillary lymphadenopathy.    LABORATORY  EXAMINATION:  Serum glucose of 119, BUN 12, creatinine 0.9, sodium 136, potassium 2.9, chloride 101, bicarbonate 29.  Troponin 0.04. White cell count 8.1, hemoglobin 11.8, hematocrit 35.8, platelet count 431,000. Urinalysis within normal limits.   The patient did have a CT of the head done without contrast which showed no evidence of acute intracranial process.   ASSESSMENT AND PLAN: This is a 74 year old female with history of breast cancer, hypertension, osteoarthritis, osteoporosis, hyperlipidemia, who presents to the hospital due to persistent dizziness and had a syncopal episode 3 days ago.   1.  Dizziness/syncope. The exact etiology of this is unclear, questionable if this is cardiogenic syncope versus just vertigo. Unlikely neurogenic source. She has a nonfocal neurologic exam.  CT head is negative. Observe her on telemetry, follow serial cardiac markers, get a 2-dimensional echocardiogram and a carotid duplex. Follow orthostatic vital signs. Empirically start her on meclizine to treat underlying vertigo and follow her clinically.  2.  Hypertension. The patient is presently hemodynamically stable. Continue triamterene, HCTZ, and Norvasc.  3.  Hypokalemia likely related to her use of diuretics. I will place her on potassium supplements, repeat it in the morning.  4.  Hyperlipidemia. Continue simvastatin.  5.  History of breast cancer: Continue with anastrozole.   CODE STATUS: The patient is a full code.   The patient will be transferred over to Dr. Kevin Fenton service.   TIME SPENT: 50 minutes.      ____________________________ Belia Heman. Verdell Carmine, MD vjs:bu D: 10/31/2014 15:02:47 ET T: 10/31/2014 15:20:15 ET JOB#: [id number]  cc: Belia Heman. Verdell Carmine, MD, <Dictator> Henreitta Leber MD ELECTRONICALLY SIGNED 11/11/2014 12:39

## 2015-04-18 DIAGNOSIS — R55 Syncope and collapse: Secondary | ICD-10-CM | POA: Diagnosis not present

## 2015-04-18 DIAGNOSIS — M150 Primary generalized (osteo)arthritis: Secondary | ICD-10-CM | POA: Diagnosis not present

## 2015-04-18 DIAGNOSIS — Z780 Asymptomatic menopausal state: Secondary | ICD-10-CM | POA: Diagnosis not present

## 2015-04-18 DIAGNOSIS — Z79899 Other long term (current) drug therapy: Secondary | ICD-10-CM | POA: Diagnosis not present

## 2015-04-18 DIAGNOSIS — R7309 Other abnormal glucose: Secondary | ICD-10-CM | POA: Diagnosis not present

## 2015-04-18 DIAGNOSIS — E782 Mixed hyperlipidemia: Secondary | ICD-10-CM | POA: Diagnosis not present

## 2015-04-18 DIAGNOSIS — I10 Essential (primary) hypertension: Secondary | ICD-10-CM | POA: Diagnosis not present

## 2015-04-26 ENCOUNTER — Ambulatory Visit: Payer: Self-pay | Admitting: Radiation Oncology

## 2015-04-26 ENCOUNTER — Ambulatory Visit
Admission: RE | Admit: 2015-04-26 | Discharge: 2015-04-26 | Disposition: A | Discharge status: Home / Self Care | Payer: Commercial Managed Care - HMO | Source: Ambulatory Visit | Attending: Radiation Oncology | Admitting: Radiation Oncology

## 2015-04-26 ENCOUNTER — Encounter: Payer: Self-pay | Admitting: Radiation Oncology

## 2015-04-26 VITALS — BP 148/84 | HR 79 | Temp 97.0°F | Resp 20

## 2015-04-26 DIAGNOSIS — C50912 Malignant neoplasm of unspecified site of left female breast: Secondary | ICD-10-CM | POA: Diagnosis not present

## 2015-04-26 DIAGNOSIS — C50911 Malignant neoplasm of unspecified site of right female breast: Secondary | ICD-10-CM

## 2015-04-26 NOTE — Evaluation (Signed)
Radiation Oncology Follow up Note  Name: Tina Johnston   Date:   04/26/2015 MRN:  [id number] DOB: [birth year]    This 74 y.o. female presents to the clinic today for follow up. Bilateral breast cancer  REFERRING PROVIDER: Marinda Elk, MD  HPI: Patient is a 74 year old female now out over a year and a half having completed radiation therapy to her bilateral breasts. 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 She had bilateral breast radiation. Seen today in routine follow-up she is doing well has some tightness in her left axilla most likely secondary to scar tissue from her sentinel node biopsy. Currently on anastrozole tolerating that well without side effect or complaints except for some hot flashes. She scheduled this month for her bilateral mammograms. She specifically denies breast tenderness cough or bone pain.  COMPLICATIONS OF TREATMENT: none  FOLLOW UP COMPLIANCE: keeps appointments   PHYSICAL EXAM:  BP 148/84 mmHg  Pulse 79  Temp(Src) 97 F (36.1 C)  Resp 20 Lungs are clear to A&P cardiac examination essentially unremarkable with regular rate and rhythm. No dominant mass or nodularity is noted in either breast in 2 positions examined. Incision is well-healed. No axillary or supraclavicular adenopathy is appreciated. Cosmetic result is excellent. Well-developed well-nourished patient in NAD. HEENT reveals PERLA, EOMI, discs not visualized.  Oral cavity is clear. No oral mucosal lesions are identified. Neck is clear without evidence of cervical or supraclavicular adenopathy. Lungs are clear to A&P. Cardiac examination is essentially unremarkable with regular rate and rhythm without murmur rub or thrill. Abdomen is benign with no organomegaly or masses noted. Motor sensory and DTR levels are equal and  symmetric in the upper and lower extremities. Cranial nerves II through XII are grossly intact. Proprioception is intact. No peripheral adenopathy or edema is identified. No motor or sensory levels are noted. Crude visual fields are within normal range.   RADIOLOGY RESULTS: Prior mammograms are reviewed  PLAN: At this time patient is a year and half out doing well continues on aromatase inhibitor therapy. I am please were overall progress. I've asked to see her back in 1 year for follow-up. She continues close follow-up care with medical oncology. Patient knows to call with any concerns.  I would like to take this opportunity for allowing me to participate in the care of your patient.Armstead Peaks., MD

## 2015-05-04 ENCOUNTER — Other Ambulatory Visit: Payer: Self-pay | Admitting: Physician Assistant

## 2015-05-04 DIAGNOSIS — C50912 Malignant neoplasm of unspecified site of left female breast: Secondary | ICD-10-CM

## 2015-05-04 DIAGNOSIS — I10 Essential (primary) hypertension: Secondary | ICD-10-CM | POA: Diagnosis not present

## 2015-05-04 DIAGNOSIS — M150 Primary generalized (osteo)arthritis: Secondary | ICD-10-CM | POA: Diagnosis not present

## 2015-05-04 DIAGNOSIS — C50911 Malignant neoplasm of unspecified site of right female breast: Secondary | ICD-10-CM

## 2015-05-15 DIAGNOSIS — L02214 Cutaneous abscess of groin: Secondary | ICD-10-CM | POA: Diagnosis not present

## 2015-05-15 DIAGNOSIS — Z780 Asymptomatic menopausal state: Secondary | ICD-10-CM | POA: Diagnosis not present

## 2015-05-15 DIAGNOSIS — M150 Primary generalized (osteo)arthritis: Secondary | ICD-10-CM | POA: Diagnosis not present

## 2015-05-17 ENCOUNTER — Ambulatory Visit: Payer: Commercial Managed Care - HMO

## 2015-05-17 ENCOUNTER — Ambulatory Visit
Admission: RE | Admit: 2015-05-17 | Discharge: 2015-05-17 | Disposition: A | Discharge status: Home / Self Care | Payer: Commercial Managed Care - HMO | Source: Ambulatory Visit | Attending: Physician Assistant | Admitting: Physician Assistant

## 2015-05-17 DIAGNOSIS — R928 Other abnormal and inconclusive findings on diagnostic imaging of breast: Secondary | ICD-10-CM | POA: Diagnosis not present

## 2015-05-17 DIAGNOSIS — Z9889 Other specified postprocedural states: Secondary | ICD-10-CM | POA: Insufficient documentation

## 2015-05-17 DIAGNOSIS — Z853 Personal history of malignant neoplasm of breast: Secondary | ICD-10-CM | POA: Diagnosis not present

## 2015-05-17 DIAGNOSIS — C50911 Malignant neoplasm of unspecified site of right female breast: Secondary | ICD-10-CM

## 2015-05-17 DIAGNOSIS — C50912 Malignant neoplasm of unspecified site of left female breast: Secondary | ICD-10-CM

## 2015-09-05 DIAGNOSIS — M150 Primary generalized (osteo)arthritis: Secondary | ICD-10-CM | POA: Diagnosis not present

## 2015-09-05 DIAGNOSIS — C50911 Malignant neoplasm of unspecified site of right female breast: Secondary | ICD-10-CM | POA: Diagnosis not present

## 2015-09-05 DIAGNOSIS — E782 Mixed hyperlipidemia: Secondary | ICD-10-CM | POA: Diagnosis not present

## 2015-09-05 DIAGNOSIS — C50912 Malignant neoplasm of unspecified site of left female breast: Secondary | ICD-10-CM | POA: Diagnosis not present

## 2015-09-05 DIAGNOSIS — I10 Essential (primary) hypertension: Secondary | ICD-10-CM | POA: Diagnosis not present

## 2015-09-05 DIAGNOSIS — R7309 Other abnormal glucose: Secondary | ICD-10-CM | POA: Diagnosis not present

## 2015-10-31 ENCOUNTER — Other Ambulatory Visit: Payer: Self-pay | Admitting: Internal Medicine

## 2015-11-24 ENCOUNTER — Inpatient Hospital Stay: Payer: Commercial Managed Care - HMO | Attending: Internal Medicine

## 2015-11-24 ENCOUNTER — Inpatient Hospital Stay (HOSPITAL_BASED_OUTPATIENT_CLINIC_OR_DEPARTMENT_OTHER): Payer: Commercial Managed Care - HMO | Admitting: Hematology and Oncology

## 2015-11-24 ENCOUNTER — Ambulatory Visit: Payer: Self-pay | Admitting: Internal Medicine

## 2015-11-24 ENCOUNTER — Other Ambulatory Visit: Payer: Self-pay

## 2015-11-24 VITALS — BP 161/74 | HR 76 | Temp 96.4°F | Resp 18 | Ht 63.5 in | Wt 148.8 lb

## 2015-11-24 DIAGNOSIS — Z79811 Long term (current) use of aromatase inhibitors: Secondary | ICD-10-CM | POA: Insufficient documentation

## 2015-11-24 DIAGNOSIS — Z923 Personal history of irradiation: Secondary | ICD-10-CM | POA: Diagnosis not present

## 2015-11-24 DIAGNOSIS — Z79899 Other long term (current) drug therapy: Secondary | ICD-10-CM | POA: Insufficient documentation

## 2015-11-24 DIAGNOSIS — Z90710 Acquired absence of both cervix and uterus: Secondary | ICD-10-CM | POA: Insufficient documentation

## 2015-11-24 DIAGNOSIS — M1990 Unspecified osteoarthritis, unspecified site: Secondary | ICD-10-CM

## 2015-11-24 DIAGNOSIS — E785 Hyperlipidemia, unspecified: Secondary | ICD-10-CM

## 2015-11-24 DIAGNOSIS — C50812 Malignant neoplasm of overlapping sites of left female breast: Secondary | ICD-10-CM | POA: Diagnosis not present

## 2015-11-24 DIAGNOSIS — C50419 Malignant neoplasm of upper-outer quadrant of unspecified female breast: Secondary | ICD-10-CM

## 2015-11-24 DIAGNOSIS — M79621 Pain in right upper arm: Secondary | ICD-10-CM

## 2015-11-24 DIAGNOSIS — I10 Essential (primary) hypertension: Secondary | ICD-10-CM | POA: Diagnosis not present

## 2015-11-24 DIAGNOSIS — R61 Generalized hyperhidrosis: Secondary | ICD-10-CM | POA: Insufficient documentation

## 2015-11-24 DIAGNOSIS — C50811 Malignant neoplasm of overlapping sites of right female breast: Secondary | ICD-10-CM | POA: Insufficient documentation

## 2015-11-24 DIAGNOSIS — M79622 Pain in left upper arm: Secondary | ICD-10-CM | POA: Diagnosis not present

## 2015-11-24 DIAGNOSIS — Z170 Estrogen receptor positive status [ER+]: Secondary | ICD-10-CM

## 2015-11-24 LAB — CBC WITH DIFFERENTIAL/PLATELET
Basophils Absolute: 0.1 10*3/uL (ref 0–0.1)
Basophils Relative: 1 %
Eosinophils Absolute: 0.3 10*3/uL (ref 0–0.7)
Eosinophils Relative: 5 %
HCT: 38.1 % (ref 35.0–47.0)
Hemoglobin: 12.2 g/dL (ref 12.0–16.0)
Lymphocytes Relative: 29 %
Lymphs Abs: 2 10*3/uL (ref 1.0–3.6)
MCH: 26.4 pg (ref 26.0–34.0)
MCHC: 32 g/dL (ref 32.0–36.0)
MCV: 82.6 fL (ref 80.0–100.0)
Monocytes Absolute: 0.5 10*3/uL (ref 0.2–0.9)
Monocytes Relative: 7 %
Neutro Abs: 4.2 10*3/uL (ref 1.4–6.5)
Neutrophils Relative %: 58 %
Platelets: 427 10*3/uL (ref 150–440)
RBC: 4.61 MIL/uL (ref 3.80–5.20)
RDW: 16 % — ABNORMAL HIGH (ref 11.5–14.5)
WBC: 7 10*3/uL (ref 3.6–11.0)

## 2015-11-24 LAB — COMPREHENSIVE METABOLIC PANEL
ALT: 18 U/L (ref 14–54)
AST: 21 U/L (ref 15–41)
Albumin: 4.2 g/dL (ref 3.5–5.0)
Alkaline Phosphatase: 78 U/L (ref 38–126)
Anion gap: 7 (ref 5–15)
BUN: 17 mg/dL (ref 6–20)
CO2: 27 mmol/L (ref 22–32)
Calcium: 9.2 mg/dL (ref 8.9–10.3)
Chloride: 100 mmol/L — ABNORMAL LOW (ref 101–111)
Creatinine, Ser: 1 mg/dL (ref 0.44–1.00)
GFR calc Af Amer: >60 mL/min (ref >60)
GFR calc non Af Amer: 53 mL/min — ABNORMAL LOW (ref >60)
Glucose, Bld: 120 mg/dL — ABNORMAL HIGH (ref 65–99)
Potassium: 3.1 mmol/L — ABNORMAL LOW (ref 3.5–5.1)
Sodium: 134 mmol/L — ABNORMAL LOW (ref 135–145)
Total Bilirubin: 0.5 mg/dL (ref 0.3–1.2)
Total Protein: 7.9 g/dL (ref 6.5–8.1)

## 2015-11-24 NOTE — Progress Notes (Signed)
[facility] day:  11/24/2015  Chief Complaint: Tina Johnston is a 74 y.o. female with a history of bilateral breast cancer who is seen for reassessment.  HPI: The patient was noted to have a cluster of microcalcifications bilaterally in 2014. 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 Tumor was ER positive (90%), PR positive (80%), and HER-2/neu negative.   She underwent a bilateral breast MRI on 06/22/2013 which revealed abnormal enhancement of the left breast at the 12:00 position as well as abnormal enhancement of the right breast at the 9:00 position. Right breast biopsy on 06/24/2013 revealed invasive mammary carcinoma with DCIS, solid and cribriform. Tumor was ER positive (greater than 90%), PR positive (1%), and HER-2/neu negative.  She underwent bilateral lumpectomy and sentinel lymph node biopsy on 07/15/2013. Right breast revealed a 1.1 cm, grade 1, invasive mammary carcinoma. Two sentinel lymph nodes were negative. Oncotype DX testing on 08/24/2013 revealed a recurrence score of 15 (low risk) which corresponded to a 10 year risk of distant recurrence with tamoxifen alone of 9%.  Pathology from the left breast revealed 2 foci of 0.6 cm each. Three sentinel lymph nodes were negative.  She received a bilateral breast radiation of 5040 cGy from 09/07/2013 until 10/14/2013. She then went underwent left and right scar boost of 1600 cGy from 10/15/2013 until 10/26/2013. She tolerated her radiation well with only some mild dry desquamation of the skin at completion of therapy.  She began anastrozole on 10/25/2013. She notes that she tolerates it well with only small sweats at night.  Bilateral mammogram on 05/17/2015 revealed no evidence of malignancy.  Symptomatically, she notes a catch under each arm. She feels the area is tight.  She performs breast exams monthly. She denies any concerns.  She has  arthritis which was not gotten worse on Arimidex. She states that she previously took Aleve and then stopped.  She is active and goes on the track and 2 walks miles (previously 3 months)  Past Medical History  Diagnosis Date  . Breast cyst   . Arthritis   . Hypertension   . Hyperlipidemia   . Breast cancer 2014    Bilateral- radiation    Past Surgical History  Procedure Laterality Date  . Abdominal hysterectomy  1968  . Breast surgery Bilateral July 15 2013    wide excision w sentinel node biopsy  . Sentinel lymph node biopsy Bilateral     Family History  Problem Relation Age of Onset  . Cancer Mother 64    breast  . Ovarian cancer Paternal Grandmother     Social History:  reports that she has never smoked. She has never used smokeless tobacco. She reports that she does not drink alcohol or use illicit drugs.  The patient is lone today.  Allergies:  Allergies  Allergen Reactions  . Ultram [Tramadol] Itching    Current Medications: Current Outpatient Prescriptions  Medication Sig Dispense Refill  . acetaminophen (TYLENOL) 500 MG tablet Take by mouth.    Marland Kitchen amLODipine (NORVASC) 5 MG tablet Take 1 tablet by mouth daily.    Marland Kitchen anastrozole (ARIMIDEX) 1 MG tablet TAKE 1 TABLET ONE TIME DAILY 90 tablet 0  . Calcium Carb-Cholecalciferol 600-800 MG-UNIT TABS Take 1 tablet by mouth 2 (two) times daily.    . fluticasone (FLONASE) 50 MCG/ACT nasal spray Place 1 spray into both nostrils daily.    . Glucosamine-MSM-Hyaluronic Acd ([place])  Take 2 capsules by mouth daily.    . hydrochlorothiazide (MICROZIDE) 12.5 MG capsule Take 12.5 mg by mouth daily.    . meloxicam (MOBIC) 15 MG tablet Take 1 tablet by mouth daily as needed.    . potassium chloride (MICRO-K) 10 MEQ CR capsule Take 1 capsule by mouth 2 (two) times daily before a meal.    . simvastatin (ZOCOR) 20 MG tablet Take 1 tablet by mouth daily.     No current facility-administered medications for this visit.     Review of Systems:  GENERAL:  Feels good.  Active.  No fevers, sweats or weight loss. PERFORMANCE STATUS (ECOG): 0 HEENT:  No visual changes, runny nose, sore throat, mouth sores or tenderness. Lungs: No shortness of breath or cough.  No hemoptysis. Cardiac:  No chest pain, palpitations, orthopnea, or PND. Breasts:  Feels tight under arms. GI:  No nausea, vomiting, diarrhea, constipation, melena or hematochezia. GU:  No urgency, frequency, dysuria, or hematuria. Musculoskeletal:  Arthritis.  No back pain.  No muscle tenderness. Extremities:  No pain or swelling. Skin:  No rashes or skin changes. Neuro:  No headache, numbness or weakness, balance or coordination issues. Endocrine:  No diabetes, thyroid issues, hot flashes or night sweats. Psych:  No mood changes, depression or anxiety. Pain:  No focal pain. Review of systems:  All other systems reviewed and found to be negative.  Physical Exam: Blood pressure 161/74, pulse 76, temperature 96.4 F (35.8 C), temperature source Tympanic, resp. rate 18, height 5' 3.5" (1.613 m), weight 148 lb 13 oz (67.5 kg). GENERAL:  Well developed, well nourished, sitting comfortably in the exam room in no acute distress. MENTAL STATUS:  Alert and oriented to person, place and time. HEAD:  Styled gray hair.  Normocephalic, atraumatic, face symmetric, no Cushingoid features. EYES:  Glasses.  Brown eyes.  Pupils equal round and reactive to light and accomodation.  No conjunctivitis or scleral icterus. ENT:  Oropharynx clear without lesion.  Dentures.  Tongue normal. Mucous membranes moist.  RESPIRATORY:  Clear to auscultation without rales, wheezes or rhonchi. CARDIOVASCULAR:  Regular rate and rhythm without murmur, rub or gallop. BREAST:  Right breast with well healed incision at 9 o'clock position.  Mild post-operative changes.  No masses, skin changes or nipple discharge.  Left breast with well healed superior areolar incision.  Moderate  post-operative and post-radiation changes.  No masses, skin changes or nipple discharge.  ABDOMEN:  Soft, non-tender, with active bowel sounds, and no hepatosplenomegaly.  No masses. SKIN:  No rashes, ulcers or lesions. EXTREMITIES: No edema, no skin discoloration or tenderness.  No palpable cords. LYMPH NODES: No palpable cervical, supraclavicular, axillary or inguinal adenopathy  NEUROLOGICAL: Unremarkable. PSYCH:  Appropriate.  Appointment on 11/24/2015  Component Date Value Ref Range Status  . WBC 11/24/2015 7.0  3.6 - 11.0 K/uL Final  . RBC 11/24/2015 4.61  3.80 - 5.20 MIL/uL Final  . Hemoglobin 11/24/2015 12.2  12.0 - 16.0 g/dL Final  . HCT 11/24/2015 38.1  35.0 - 47.0 % Final  . MCV 11/24/2015 82.6  80.0 - 100.0 fL Final  . MCH 11/24/2015 26.4  26.0 - 34.0 pg Final  . MCHC 11/24/2015 32.0  32.0 - 36.0 g/dL Final  . RDW 11/24/2015 16.0* 11.5 - 14.5 % Final  . Platelets 11/24/2015 427  150 - 440 K/uL Final  . Neutrophils Relative % 11/24/2015 58   Final  . Neutro Abs 11/24/2015 4.2  1.4 - 6.5 K/uL Final  .  Lymphocytes Relative 11/24/2015 29   Final  . Lymphs Abs 11/24/2015 2.0  1.0 - 3.6 K/uL Final  . Monocytes Relative 11/24/2015 7   Final  . Monocytes Absolute 11/24/2015 0.5  0.2 - 0.9 K/uL Final  . Eosinophils Relative 11/24/2015 5   Final  . Eosinophils Absolute 11/24/2015 0.3  0 - 0.7 K/uL Final  . Basophils Relative 11/24/2015 1   Final  . Basophils Absolute 11/24/2015 0.1  0 - 0.1 K/uL Final    Assessment:  Tina Johnston is a 74 y.o. female with bilateral breast cancer s/p bilateral lumpectomy and sentinel lymph node biopsy on 07/15/2013.  She received a bilateral breast radiation of 5040 cGy from 09/07/2013 until 10/14/2013. She then went underwent left and right scar boost of 1600 cGy from 10/15/2013 until 10/26/2013.  She has been on Arimidex since 10/25/2013.   Right breast pathology revealed a 1.1 cm, grade I, invasive mammary carcinoma. Two sentinel lymph nodes  were negative. Tumor was ER positive (greater than 90%), PR positive (1%), and HER-2/neu negative.  Oncotype DX testing on 08/24/2013 revealed a recurrence score of 15 (low risk) which corresponded to a 10 year risk of distant recurrence with tamoxifen alone of 9%.  Left breast pathology revealed 2 foci of 0.6 cm each. Three sentinel lymph nodes were negative.  Tumor was ER positive (90%), PR positive (80%), and HER-2/neu negative.   Bilateral mammogram on 05/17/2015 revealed no evidence of malignancy.  Symptomatically, she notes a catch under each arm. She feels the area is tight.  Exam reveals post-operative and post-radiation changes.  Plan: 1. Review time medical history, diagnosis and management of bilateral breast cancer.  Discuss Oncotype DX testing.  Discss recent mammogram.  Discuss continuation of Arimidex. 2. Labs today:  CBC with diff, CMP, CA27.29. 3. Obtain copy of recent mammogram-done. 4. Obtain copy of radiation summary-done. 5. Schedule mammogram 05/16/2016. 6. RTC in 6 months for MD assessment and labs (CBC with diff, CMP, CA27.29).   Lequita Asal, MD  11/24/2015, 11:44 AM

## 2015-11-25 LAB — CANCER ANTIGEN 27.29: CA 27.29: 12.2 U/mL (ref 0.0–38.6)

## 2015-11-26 ENCOUNTER — Encounter: Payer: Self-pay | Admitting: Hematology and Oncology

## 2015-11-30 ENCOUNTER — Other Ambulatory Visit: Payer: Self-pay

## 2015-11-30 DIAGNOSIS — C50419 Malignant neoplasm of upper-outer quadrant of unspecified female breast: Secondary | ICD-10-CM

## 2015-11-30 DIAGNOSIS — E876 Hypokalemia: Secondary | ICD-10-CM | POA: Diagnosis not present

## 2015-12-05 DIAGNOSIS — H1013 Acute atopic conjunctivitis, bilateral: Secondary | ICD-10-CM | POA: Diagnosis not present

## 2015-12-27 ENCOUNTER — Other Ambulatory Visit: Payer: Self-pay | Admitting: Internal Medicine

## 2016-01-03 DIAGNOSIS — C50411 Malignant neoplasm of upper-outer quadrant of right female breast: Secondary | ICD-10-CM | POA: Diagnosis not present

## 2016-01-03 DIAGNOSIS — I10 Essential (primary) hypertension: Secondary | ICD-10-CM | POA: Diagnosis not present

## 2016-01-03 DIAGNOSIS — M150 Primary generalized (osteo)arthritis: Secondary | ICD-10-CM | POA: Diagnosis not present

## 2016-01-03 DIAGNOSIS — E782 Mixed hyperlipidemia: Secondary | ICD-10-CM | POA: Diagnosis not present

## 2016-01-10 ENCOUNTER — Telehealth: Payer: Self-pay | Admitting: *Deleted

## 2016-01-10 MED ORDER — ANASTROZOLE 1 MG PO TABS: ORAL_TABLET | ORAL | Status: DC

## 2016-01-10 NOTE — Telephone Encounter (Signed)
Escribed

## 2016-01-22 DIAGNOSIS — R102 Pelvic and perineal pain: Secondary | ICD-10-CM | POA: Diagnosis not present

## 2016-03-06 ENCOUNTER — Other Ambulatory Visit: Payer: Self-pay | Admitting: Hematology and Oncology

## 2016-03-26 DIAGNOSIS — R1031 Right lower quadrant pain: Secondary | ICD-10-CM | POA: Diagnosis not present

## 2016-03-26 DIAGNOSIS — R1032 Left lower quadrant pain: Secondary | ICD-10-CM | POA: Diagnosis not present

## 2016-04-24 ENCOUNTER — Ambulatory Visit
Admission: RE | Admit: 2016-04-24 | Discharge: 2016-04-24 | Disposition: A | Discharge status: Home / Self Care | Payer: Commercial Managed Care - HMO | Source: Ambulatory Visit | Attending: Radiation Oncology | Admitting: Radiation Oncology

## 2016-04-24 ENCOUNTER — Encounter: Payer: Self-pay | Admitting: Radiation Oncology

## 2016-04-24 ENCOUNTER — Other Ambulatory Visit: Payer: Self-pay | Admitting: *Deleted

## 2016-04-24 VITALS — BP 171/75 | HR 73 | Temp 96.8°F | Resp 20 | Wt 144.7 lb

## 2016-04-24 DIAGNOSIS — C50912 Malignant neoplasm of unspecified site of left female breast: Secondary | ICD-10-CM

## 2016-04-24 DIAGNOSIS — C50911 Malignant neoplasm of unspecified site of right female breast: Secondary | ICD-10-CM

## 2016-04-24 DIAGNOSIS — Z170 Estrogen receptor positive status [ER+]: Secondary | ICD-10-CM | POA: Diagnosis not present

## 2016-04-24 MED ORDER — AZITHROMYCIN 250 MG PO TABS: ORAL_TABLET | ORAL | Status: DC

## 2016-04-24 NOTE — Progress Notes (Signed)
Radiation Oncology Follow up Note  Name: Tina Johnston   Date:   04/24/2016 MRN:  [id number] DOB: 07-17-1941    This 75 y.o. female presents to the clinic today for follow-up for bilateral whole breast radiation now out 2-1/2 years.  REFERRING PROVIDER: Marinda Elk, MD  HPI: Patient is a 75 year old female now 2-1/2 years having completed radiation therapy to her bilateral breasts. 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 She seen today in routine follow-up and is doing well. She's currently on anastrozole tolerating that well without side effect. She specifically denies breast tenderness cough or bone pain. She is awaiting her next mammogram which will be in July mammograms prior to this have been fine.. Last mammogram in May 2016 was BI-RADS 2 recommend for 1 year follow-up  COMPLICATIONS OF TREATMENT: none  FOLLOW UP COMPLIANCE: keeps appointments   PHYSICAL EXAM:  BP 171/75 mmHg  Pulse 73  Temp(Src) 96.8 F (36 C)  Resp 20  Wt 144 lb 11.7 oz (65.65 kg) Lungs are clear to A&P cardiac examination essentially unremarkable with regular rate and rhythm. No dominant mass or nodularity is noted in either breast in 2 positions examined. Incision is well-healed. No axillary or supraclavicular adenopathy is appreciated. Cosmetic result is excellent. Well-developed well-nourished patient in NAD. HEENT reveals PERLA, EOMI, discs not visualized.  Oral cavity is clear. No oral mucosal lesions are identified. Neck is clear without evidence of cervical or supraclavicular adenopathy. Lungs are clear to A&P. Cardiac examination is essentially unremarkable with regular rate and rhythm without murmur rub or thrill. Abdomen is benign with no organomegaly or masses noted. Motor sensory and DTR levels are equal and symmetric in the upper and lower extremities. Cranial  nerves II through XII are grossly intact. Proprioception is intact. No peripheral adenopathy or edema is identified. No motor or sensory levels are noted. Crude visual fields are within normal range.  RADIOLOGY RESULTS: Mammograms reviewed  PLAN: Present time she continues to do well with no evidence of disease. I am please were overall progress. I have asked to see her back in 1 year for follow-up. She knows to call sooner with any concerns.  I would like to take this opportunity for allowing me to participate in the care of your patient.Armstead Peaks., MD

## 2016-04-30 DIAGNOSIS — I10 Essential (primary) hypertension: Secondary | ICD-10-CM | POA: Diagnosis not present

## 2016-04-30 DIAGNOSIS — E782 Mixed hyperlipidemia: Secondary | ICD-10-CM | POA: Diagnosis not present

## 2016-04-30 DIAGNOSIS — C50411 Malignant neoplasm of upper-outer quadrant of right female breast: Secondary | ICD-10-CM | POA: Diagnosis not present

## 2016-04-30 DIAGNOSIS — C50412 Malignant neoplasm of upper-outer quadrant of left female breast: Secondary | ICD-10-CM | POA: Diagnosis not present

## 2016-05-06 ENCOUNTER — Ambulatory Visit: Payer: Commercial Managed Care - HMO

## 2016-05-07 ENCOUNTER — Other Ambulatory Visit: Payer: Self-pay | Admitting: Physician Assistant

## 2016-05-07 DIAGNOSIS — Z0000 Encounter for general adult medical examination without abnormal findings: Secondary | ICD-10-CM

## 2016-05-07 DIAGNOSIS — M150 Primary generalized (osteo)arthritis: Secondary | ICD-10-CM | POA: Diagnosis not present

## 2016-05-07 DIAGNOSIS — C50112 Malignant neoplasm of central portion of left female breast: Secondary | ICD-10-CM | POA: Diagnosis not present

## 2016-05-07 DIAGNOSIS — R7303 Prediabetes: Secondary | ICD-10-CM | POA: Diagnosis not present

## 2016-05-07 DIAGNOSIS — C50111 Malignant neoplasm of central portion of right female breast: Secondary | ICD-10-CM | POA: Diagnosis not present

## 2016-05-07 DIAGNOSIS — C50411 Malignant neoplasm of upper-outer quadrant of right female breast: Secondary | ICD-10-CM | POA: Diagnosis not present

## 2016-05-07 DIAGNOSIS — Z780 Asymptomatic menopausal state: Secondary | ICD-10-CM | POA: Diagnosis not present

## 2016-05-07 DIAGNOSIS — E782 Mixed hyperlipidemia: Secondary | ICD-10-CM | POA: Diagnosis not present

## 2016-05-07 DIAGNOSIS — Z23 Encounter for immunization: Secondary | ICD-10-CM | POA: Diagnosis not present

## 2016-05-07 DIAGNOSIS — I10 Essential (primary) hypertension: Secondary | ICD-10-CM | POA: Diagnosis not present

## 2016-05-22 ENCOUNTER — Ambulatory Visit
Admission: RE | Admit: 2016-05-22 | Discharge: 2016-05-22 | Disposition: A | Discharge status: Home / Self Care | Payer: Commercial Managed Care - HMO | Source: Ambulatory Visit | Attending: Physician Assistant | Admitting: Physician Assistant

## 2016-05-22 DIAGNOSIS — R928 Other abnormal and inconclusive findings on diagnostic imaging of breast: Secondary | ICD-10-CM | POA: Diagnosis not present

## 2016-05-22 DIAGNOSIS — Z853 Personal history of malignant neoplasm of breast: Secondary | ICD-10-CM | POA: Insufficient documentation

## 2016-05-22 DIAGNOSIS — Z0000 Encounter for general adult medical examination without abnormal findings: Secondary | ICD-10-CM

## 2016-05-24 ENCOUNTER — Other Ambulatory Visit: Payer: Commercial Managed Care - HMO

## 2016-05-24 ENCOUNTER — Ambulatory Visit: Payer: Commercial Managed Care - HMO | Admitting: Hematology and Oncology

## 2016-05-28 ENCOUNTER — Encounter: Payer: Self-pay | Admitting: Hematology and Oncology

## 2016-05-28 ENCOUNTER — Inpatient Hospital Stay: Payer: Commercial Managed Care - HMO | Attending: Hematology and Oncology

## 2016-05-28 ENCOUNTER — Inpatient Hospital Stay (HOSPITAL_BASED_OUTPATIENT_CLINIC_OR_DEPARTMENT_OTHER): Payer: Commercial Managed Care - HMO | Admitting: Hematology and Oncology

## 2016-05-28 VITALS — BP 163/78 | HR 82 | Temp 97.0°F | Ht 63.0 in | Wt 147.8 lb

## 2016-05-28 DIAGNOSIS — Z170 Estrogen receptor positive status [ER+]: Secondary | ICD-10-CM

## 2016-05-28 DIAGNOSIS — C50912 Malignant neoplasm of unspecified site of left female breast: Secondary | ICD-10-CM

## 2016-05-28 DIAGNOSIS — Z803 Family history of malignant neoplasm of breast: Secondary | ICD-10-CM | POA: Diagnosis not present

## 2016-05-28 DIAGNOSIS — E785 Hyperlipidemia, unspecified: Secondary | ICD-10-CM

## 2016-05-28 DIAGNOSIS — N644 Mastodynia: Secondary | ICD-10-CM | POA: Insufficient documentation

## 2016-05-28 DIAGNOSIS — C50419 Malignant neoplasm of upper-outer quadrant of unspecified female breast: Secondary | ICD-10-CM

## 2016-05-28 DIAGNOSIS — Z923 Personal history of irradiation: Secondary | ICD-10-CM | POA: Diagnosis not present

## 2016-05-28 DIAGNOSIS — Z79899 Other long term (current) drug therapy: Secondary | ICD-10-CM | POA: Insufficient documentation

## 2016-05-28 DIAGNOSIS — I10 Essential (primary) hypertension: Secondary | ICD-10-CM

## 2016-05-28 DIAGNOSIS — Z79811 Long term (current) use of aromatase inhibitors: Secondary | ICD-10-CM | POA: Insufficient documentation

## 2016-05-28 DIAGNOSIS — C50911 Malignant neoplasm of unspecified site of right female breast: Secondary | ICD-10-CM | POA: Insufficient documentation

## 2016-05-28 DIAGNOSIS — M1990 Unspecified osteoarthritis, unspecified site: Secondary | ICD-10-CM | POA: Insufficient documentation

## 2016-05-28 DIAGNOSIS — Z8041 Family history of malignant neoplasm of ovary: Secondary | ICD-10-CM | POA: Insufficient documentation

## 2016-05-28 DIAGNOSIS — E876 Hypokalemia: Secondary | ICD-10-CM

## 2016-05-28 LAB — CBC WITH DIFFERENTIAL/PLATELET
Basophils Absolute: 0 10*3/uL (ref 0–0.1)
Basophils Relative: 1 %
Eosinophils Absolute: 0.3 10*3/uL (ref 0–0.7)
Eosinophils Relative: 4 %
HCT: 37.2 % (ref 35.0–47.0)
Hemoglobin: 12.2 g/dL (ref 12.0–16.0)
Lymphocytes Relative: 31 %
Lymphs Abs: 2.4 10*3/uL (ref 1.0–3.6)
MCH: 26.9 pg (ref 26.0–34.0)
MCHC: 32.8 g/dL (ref 32.0–36.0)
MCV: 81.9 fL (ref 80.0–100.0)
Monocytes Absolute: 0.6 10*3/uL (ref 0.2–0.9)
Monocytes Relative: 8 %
Neutro Abs: 4.4 10*3/uL (ref 1.4–6.5)
Neutrophils Relative %: 56 %
Platelets: 383 10*3/uL (ref 150–440)
RBC: 4.54 MIL/uL (ref 3.80–5.20)
RDW: 16.5 % — ABNORMAL HIGH (ref 11.5–14.5)
WBC: 7.7 10*3/uL (ref 3.6–11.0)

## 2016-05-28 LAB — COMPREHENSIVE METABOLIC PANEL
ALT: 16 U/L (ref 14–54)
AST: 21 U/L (ref 15–41)
Albumin: 4.2 g/dL (ref 3.5–5.0)
Alkaline Phosphatase: 70 U/L (ref 38–126)
Anion gap: 8 (ref 5–15)
BUN: 14 mg/dL (ref 6–20)
CO2: 27 mmol/L (ref 22–32)
Calcium: 9.4 mg/dL (ref 8.9–10.3)
Chloride: 102 mmol/L (ref 101–111)
Creatinine, Ser: 0.9 mg/dL (ref 0.44–1.00)
GFR calc Af Amer: >60 mL/min (ref >60)
GFR calc non Af Amer: >60 mL/min (ref >60)
Glucose, Bld: 143 mg/dL — ABNORMAL HIGH (ref 65–99)
Potassium: 3.3 mmol/L — ABNORMAL LOW (ref 3.5–5.1)
Sodium: 137 mmol/L (ref 135–145)
Total Bilirubin: 0.7 mg/dL (ref 0.3–1.2)
Total Protein: 7.8 g/dL (ref 6.5–8.1)

## 2016-05-28 NOTE — Progress Notes (Signed)
Patient here for follow up. Has some pain in her breasts bilaterally.

## 2016-05-28 NOTE — Progress Notes (Signed)
[facility] day:  05/28/2016  Chief Complaint: Tina Johnston is a 75 y.o. female with a history of bilateral breast cancer who is seen for 6 month assessment.  HPI: The patient was last seen in the medical oncology clinic on 11/24/2015.  At that time, she was seen for initial assessment. She noted a catch under each arm. She felt the area was tight.  Exam revealed post-operative and post-radiation changes. Labs included a normal CBC with diff, CMP (except for a potassium of 3.1), and CA27.29 (12.2).  Bilateral mammogram on 05/22/2016 revealed no evidence of malignancy.  During the interim, she has continued her Arimidex.  She notes discomfort in her breasts.  She denies any masses or skin changes.  She was prescribed bras by Dr. Baruch Gouty.  She also notes poor sleep at night and catnap during the day.  She has tried Tylenol PM and melatonin.   Past Medical History  Diagnosis Date  . Breast cyst   . Arthritis   . Hypertension   . Hyperlipidemia   . Breast cancer [facility]) 2014    Bilateral- radiation    Past Surgical History  Procedure Laterality Date  . Abdominal hysterectomy  1968  . Breast surgery Bilateral July 15 2013    wide excision w sentinel node biopsy  . Sentinel lymph node biopsy Bilateral   . Breast biopsy Bilateral 2014    Bilateral Bx Both Positive c Bilateral Lumpectomy  . Breast cyst aspiration Right 2014    Positive    Family History  Problem Relation Age of Onset  . Cancer Mother 27    breast  . Breast cancer Mother 57  . Ovarian cancer Paternal Grandmother     Social History:  reports that she has never smoked. She has never used smokeless tobacco. She reports that she does not drink alcohol or use illicit drugs.  The patient is alone today.  Allergies:  Allergies  Allergen Reactions  . Ultram [Tramadol] Itching    Current Medications: Current Outpatient Prescriptions  Medication Sig Dispense Refill   . acetaminophen (TYLENOL) 500 MG tablet Take by mouth.    Marland Kitchen amLODipine (NORVASC) 5 MG tablet Take 1 tablet by mouth daily.    Marland Kitchen anastrozole (ARIMIDEX) 1 MG tablet TAKE 1 TABLET EVERY DAY 90 tablet 0  . Calcium Carb-Cholecalciferol 600-800 MG-UNIT TABS Take 1 tablet by mouth 2 (two) times daily.    . fluticasone (FLONASE) 50 MCG/ACT nasal spray Place 1 spray into both nostrils daily.    . hydrochlorothiazide (MICROZIDE) 12.5 MG capsule Take 12.5 mg by mouth daily.    . meclizine (ANTIVERT) 25 MG tablet     . Multiple Vitamins-Minerals (MULTIVITAMIN WITH MINERALS) tablet Take by mouth.    . potassium chloride (K-DUR,KLOR-CON) 10 MEQ tablet     . simvastatin (ZOCOR) 20 MG tablet Take 1 tablet by mouth daily.    . furosemide (LASIX) 20 MG tablet Reported on 05/28/2016     No current facility-administered medications for this visit.    Review of Systems:  GENERAL:  Feels good.  No fevers, sweats or weight loss. PERFORMANCE STATUS (ECOG): 0 HEENT:  No visual changes, runny nose, sore throat, mouth sores or tenderness. Lungs: No shortness of breath or cough.  No hemoptysis. Cardiac:  No chest pain, palpitations, orthopnea, or PND. Breasts:  Feels tight under arms.  Breasts tender. GI:  No nausea, vomiting, diarrhea, constipation, melena or hematochezia. GU:  No urgency, frequency,  dysuria, or hematuria. Musculoskeletal:  Arthritis.  No back pain.  No muscle tenderness. Extremities:  No pain or swelling. Skin:  No rashes or skin changes. Neuro:  No headache, numbness or weakness, balance or coordination issues. Endocrine:  No diabetes, thyroid issues, hot flashes or night sweats. Psych:  No mood changes, depression or anxiety.  Poor sleep. Pain:  No focal pain. Review of systems:  All other systems reviewed and found to be negative.  Physical Exam: Blood pressure 163/78, pulse 82, temperature 97 F (36.1 C), temperature source Tympanic, height '5\' 3"'  (1.6 m), weight 147 lb 13.1 oz (67.05  kg). GENERAL:  Well developed, well nourished, woman sitting comfortably in the exam room in no acute distress. MENTAL STATUS:  Alert and oriented to person, place and time. HEAD:  Styled shoulder length gray hair.  Normocephalic, atraumatic, face symmetric, no Cushingoid features. EYES:  Glasses.  Brown eyes.  Pupils equal round and reactive to light and accomodation.  No conjunctivitis or scleral icterus. ENT:  Oropharynx clear without lesion.  Dentures.  Tongue normal. Mucous membranes moist.  RESPIRATORY:  Clear to auscultation without rales, wheezes or rhonchi. CARDIOVASCULAR:  Regular rate and rhythm without murmur, rub or gallop. BREAST:  Right breast with well healed incision at 9 o'clock position.  Mild post-operative changes.  No masses, skin changes or nipple discharge.  Left breast with well healed superior areolar incision.  Moderate post-operative and post-radiation changes.  Fibrocystic changes.  No masses, skin changes or nipple discharge.  ABDOMEN:  Soft, non-tender, with active bowel sounds, and no hepatosplenomegaly.  No masses. SKIN:  No rashes, ulcers or lesions. EXTREMITIES: No edema, no skin discoloration or tenderness.  No palpable cords. LYMPH NODES: No palpable cervical, supraclavicular, axillary or inguinal adenopathy  NEUROLOGICAL: Unremarkable. PSYCH:  Appropriate.  Appointment on 05/28/2016  Component Date Value Ref Range Status  . WBC 05/28/2016 7.7  3.6 - 11.0 K/uL Final  . RBC 05/28/2016 4.54  3.80 - 5.20 MIL/uL Final  . Hemoglobin 05/28/2016 12.2  12.0 - 16.0 g/dL Final  . HCT 05/28/2016 37.2  35.0 - 47.0 % Final  . MCV 05/28/2016 81.9  80.0 - 100.0 fL Final  . MCH 05/28/2016 26.9  26.0 - 34.0 pg Final  . MCHC 05/28/2016 32.8  32.0 - 36.0 g/dL Final  . RDW 05/28/2016 16.5* 11.5 - 14.5 % Final  . Platelets 05/28/2016 383  150 - 440 K/uL Final  . Neutrophils Relative % 05/28/2016 56   Final  . Neutro Abs 05/28/2016 4.4  1.4 - 6.5 K/uL Final  . Lymphocytes  Relative 05/28/2016 31   Final  . Lymphs Abs 05/28/2016 2.4  1.0 - 3.6 K/uL Final  . Monocytes Relative 05/28/2016 8   Final  . Monocytes Absolute 05/28/2016 0.6  0.2 - 0.9 K/uL Final  . Eosinophils Relative 05/28/2016 4   Final  . Eosinophils Absolute 05/28/2016 0.3  0 - 0.7 K/uL Final  . Basophils Relative 05/28/2016 1   Final  . Basophils Absolute 05/28/2016 0.0  0 - 0.1 K/uL Final  . Sodium 05/28/2016 137  135 - 145 mmol/L Final  . Potassium 05/28/2016 3.3* 3.5 - 5.1 mmol/L Final  . Chloride 05/28/2016 102  101 - 111 mmol/L Final  . CO2 05/28/2016 27  22 - 32 mmol/L Final  . Glucose, Bld 05/28/2016 143* 65 - 99 mg/dL Final  . BUN 05/28/2016 14  6 - 20 mg/dL Final  . Creatinine, Ser 05/28/2016 0.90  0.44 - 1.00 mg/dL  Final  . Calcium 05/28/2016 9.4  8.9 - 10.3 mg/dL Final  . Total Protein 05/28/2016 7.8  6.5 - 8.1 g/dL Final  . Albumin 05/28/2016 4.2  3.5 - 5.0 g/dL Final  . AST 05/28/2016 21  15 - 41 U/L Final  . ALT 05/28/2016 16  14 - 54 U/L Final  . Alkaline Phosphatase 05/28/2016 70  38 - 126 U/L Final  . Total Bilirubin 05/28/2016 0.7  0.3 - 1.2 mg/dL Final  . GFR calc non Af Amer 05/28/2016 >60  >60 mL/min Final  . GFR calc Af Amer 05/28/2016 >60  >60 mL/min Final   Comment: (NOTE) The eGFR has been calculated using the CKD EPI equation. This calculation has not been validated in all clinical situations. eGFR's persistently <60 mL/min signify possible Chronic Kidney Disease.   . Anion gap 05/28/2016 8  5 - 15 Final    Assessment:  Tina Johnston is a 75 y.o. female with bilateral breast cancer s/p bilateral lumpectomy and sentinel lymph node biopsy on 07/15/2013.  She received a bilateral breast radiation of 5040 cGy from 09/07/2013 until 10/14/2013. She then went underwent left and right scar boost of 1600 cGy from 10/15/2013 until 10/26/2013.  She has been on Arimidex since 10/25/2013.   Right breast pathology revealed a 1.1 cm, grade I, invasive mammary carcinoma.  Two sentinel lymph nodes were negative. Tumor was ER positive (greater than 90%), PR positive (1%), and HER-2/neu negative.  Oncotype DX testing on 08/24/2013 revealed a recurrence score of 15 (low risk) which corresponded to a 10 year risk of distant recurrence with tamoxifen alone of 9%.  Left breast pathology revealed 2 foci of 0.6 cm each. Three sentinel lymph nodes were negative.  Tumor was ER positive (90%), PR positive (80%), and HER-2/neu negative.   Bilateral mammogram on 05/22/2016 revealed no evidence of malignancy.  CA27.29 was 12.2 on 11/24/2015 and 12.9 on 05/28/2016.  Symptomatically, she has mild breast tenderness.  Exam is stable.  Potassium is low (3.3).  Plan: 1. Labs today:  CBC with diff, CMP, CA27.29. 2. Review interval mammogram-done. 3. Continue Arimidex. 4. Discuss slightly low potassium.  She is on HCTZ and potassium supplementation.  FAX labs to Dr. Carrie Mew. 5. RTC in 6 months for MD assessment and labs (CBC with diff, CMP, CA27.29).   Lequita Asal, MD  05/28/2016, 10:49 AM

## 2016-05-29 LAB — CANCER ANTIGEN 27.29: CA 27.29: 12.9 U/mL (ref 0.0–38.6)

## 2016-06-03 DIAGNOSIS — E876 Hypokalemia: Secondary | ICD-10-CM | POA: Diagnosis not present

## 2016-06-06 DIAGNOSIS — Z9013 Acquired absence of bilateral breasts and nipples: Secondary | ICD-10-CM | POA: Diagnosis not present

## 2016-08-30 ENCOUNTER — Emergency Department
Admission: EM | Admit: 2016-08-30 | Discharge: 2016-08-30 | Disposition: A | Discharge status: Home / Self Care | Payer: Commercial Managed Care - HMO | Attending: Emergency Medicine | Admitting: Emergency Medicine

## 2016-08-30 DIAGNOSIS — R252 Cramp and spasm: Secondary | ICD-10-CM | POA: Insufficient documentation

## 2016-08-30 DIAGNOSIS — Z853 Personal history of malignant neoplasm of breast: Secondary | ICD-10-CM | POA: Diagnosis not present

## 2016-08-30 DIAGNOSIS — E876 Hypokalemia: Secondary | ICD-10-CM | POA: Diagnosis not present

## 2016-08-30 DIAGNOSIS — I10 Essential (primary) hypertension: Secondary | ICD-10-CM | POA: Diagnosis not present

## 2016-08-30 LAB — COMPREHENSIVE METABOLIC PANEL
ALBUMIN: 4.2 g/dL (ref 3.5–5.0)
ALK PHOS: 70 U/L (ref 38–126)
ALT: 20 U/L (ref 14–54)
ANION GAP: 8 (ref 5–15)
AST: 22 U/L (ref 15–41)
BILIRUBIN TOTAL: 0.3 mg/dL (ref 0.3–1.2)
BUN: 12 mg/dL (ref 6–20)
CALCIUM: 9.4 mg/dL (ref 8.9–10.3)
CO2: 28 mmol/L (ref 22–32)
CREATININE: 0.84 mg/dL (ref 0.44–1.00)
Chloride: 103 mmol/L (ref 101–111)
GFR calc Af Amer: >60 mL/min (ref >60)
GFR calc non Af Amer: >60 mL/min (ref >60)
GLUCOSE: 115 mg/dL — AB (ref 65–99)
Potassium: 3.3 mmol/L — ABNORMAL LOW (ref 3.5–5.1)
Sodium: 139 mmol/L (ref 135–145)
TOTAL PROTEIN: 7.9 g/dL (ref 6.5–8.1)

## 2016-08-30 LAB — URINALYSIS COMPLETE WITH MICROSCOPIC (ARMC ONLY)
BILIRUBIN URINE: NEGATIVE
GLUCOSE, UA: NEGATIVE mg/dL
KETONES UR: NEGATIVE mg/dL
Leukocytes, UA: NEGATIVE
Nitrite: NEGATIVE
Protein, ur: NEGATIVE mg/dL
Specific Gravity, Urine: 1.004 — ABNORMAL LOW (ref 1.005–1.030)
pH: 7 (ref 5.0–8.0)

## 2016-08-30 MED ORDER — POTASSIUM CHLORIDE ER 10 MEQ PO TBCR: 10.0000 meq | EXTENDED_RELEASE_TABLET | Freq: Every day | ORAL | 0 refills | Status: AC

## 2016-08-30 MED ORDER — POTASSIUM CHLORIDE CRYS ER 20 MEQ PO TBCR
10.0000 meq | EXTENDED_RELEASE_TABLET | Freq: Once | ORAL | Status: AC
  Administered 2016-08-30: 10 meq via ORAL

## 2016-08-30 MED ORDER — POTASSIUM CHLORIDE CRYS ER 20 MEQ PO TBCR
EXTENDED_RELEASE_TABLET | ORAL | Status: AC
  Administered 2016-08-30: 10 meq via ORAL
  Filled 2016-08-30: qty 1

## 2016-08-30 NOTE — ED Provider Notes (Signed)
[facility] Emergency Department Provider Note ____________________________________________  Time seen: Approximately 8:25 PM  I have reviewed the triage vital signs and the nursing notes.   HISTORY  Chief Complaint Hand Pain and Foot Pain    HPI Tina Johnston is a 75 y.o. female who presents to the emergency department for evaluation of hand and feet and pink. She states that the cramping in her feet is chronic and not uncommon, however she's never had cramps in her hands until today. She states that both hands "drew up." She has a history of hypokalemia and takes 10 mEq of potassium in the morning and then again in the evening. She has not had any lab work since May. She states that she has a appointment scheduled with her PCP next week. She denies other symptoms. She denies recent illness. She denies change in medication recently.  Past Medical History:  Diagnosis Date  . Arthritis   . 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 Breast cyst   . Hyperlipidemia   . Hypertension     Patient Active Problem List   Diagnosis Date Noted  . Malignant neoplasm of other specified sites of female breast 08/10/2013  . Malignant neoplasm of upper-outer quadrant of female breast ([place]) 07/13/2013    Past Surgical History:  Procedure Laterality Date  . ABDOMINAL HYSTERECTOMY  1968  . BREAST BIOPSY Bilateral 2014   Bilateral Bx Both Positive c Bilateral Lumpectomy  . BREAST CYST ASPIRATION Right 2014   Positive  . BREAST SURGERY Bilateral July 15 2013   wide excision w sentinel node biopsy  . SENTINEL LYMPH NODE BIOPSY Bilateral     Prior to Admission medications   Medication Sig Start Date End Date Taking? Authorizing Provider  acetaminophen (TYLENOL) 500 MG tablet Take by mouth.    Historical Provider, MD  amLODipine (NORVASC) 5 MG tablet Take 1 tablet by mouth daily.    Historical Provider, MD  anastrozole (ARIMIDEX) 1 MG tablet TAKE 1 TABLET  EVERY DAY 03/06/16   Lequita Asal, MD  Calcium Carb-Cholecalciferol 600-800 MG-UNIT TABS Take 1 tablet by mouth 2 (two) times daily.    Historical Provider, MD  fluticasone (FLONASE) 50 MCG/ACT nasal spray Place 1 spray into both nostrils daily.    Historical Provider, MD  furosemide (LASIX) 20 MG tablet Reported on 05/28/2016 11/10/15   Historical Provider, MD  hydrochlorothiazide (MICROZIDE) 12.5 MG capsule Take 12.5 mg by mouth daily.    Historical Provider, MD  meclizine (ANTIVERT) 25 MG tablet  11/30/14   Historical Provider, MD  Multiple Vitamins-Minerals (MULTIVITAMIN WITH MINERALS) tablet Take by mouth. 07/06/14   Historical Provider, MD  potassium chloride (K-DUR) 10 MEQ tablet Take 1 tablet (10 mEq total) by mouth daily. 08/30/16   Victorino Dike, FNP  potassium chloride (K-DUR,KLOR-CON) 10 MEQ tablet  02/21/16   Historical Provider, MD  simvastatin (ZOCOR) 20 MG tablet Take 1 tablet by mouth daily.    Historical Provider, MD    Allergies Ultram [tramadol]  Family History  Problem Relation Age of Onset  . Cancer Mother 69    breast  . Breast cancer Mother 76  . Ovarian cancer Paternal Grandmother     Social History Social History  Substance Use Topics  . Smoking status: Never Smoker  . Smokeless tobacco: Never Used  . Alcohol use No    Review of Systems Constitutional: No recent illness. Cardiovascular: Denies chest pain or palpitations. Respiratory: Denies shortness of breath.  Musculoskeletal: Pain in Bilateral hands and feet Skin: Negative for rash, wound, lesion. Neurological: Negative for focal weakness or numbness.  ____________________________________________   PHYSICAL EXAM:  VITAL SIGNS: ED Triage Vitals  Enc Vitals Group     BP 08/30/16 1931 (!) 164/75     Pulse Rate 08/30/16 1931 84     Resp 08/30/16 1931 18     Temp 08/30/16 1931 99.2 F (37.3 C)     Temp Source 08/30/16 1931 Oral     SpO2 08/30/16 1931 98 %     Weight 08/30/16 1931 147 lb  (66.7 kg)     Height 08/30/16 1931 5\' 3"  (1.6 m)     Head Circumference --      Peak Flow --      Pain Score 08/30/16 1932 3     Pain Loc --      Pain Edu? --      Excl. in [place]? --     Constitutional: Alert and oriented. Well appearing and in no acute distress. Eyes: Conjunctivae are normal. EOMI. Head: Atraumatic. Neck: No stridor.  Respiratory: Normal respiratory effort.   Musculoskeletal: No cramps or spasms noted on exam. Full range of motion of all extremities including hands and feet. Neurologic:  Normal speech and language. No gross focal neurologic deficits are appreciated. Speech is normal. No gait instability. Skin:  Skin is warm, dry and intact. Atraumatic. Psychiatric: Mood and affect are normal. Speech and behavior are normal.  ____________________________________________   LABS (all labs ordered are listed, but only abnormal results are displayed)  Labs Reviewed  COMPREHENSIVE METABOLIC PANEL - Abnormal; Notable for the following:       Result Value   Potassium 3.3 (*)    Glucose, Bld 115 (*)    All other components within normal limits  URINALYSIS COMPLETEWITH MICROSCOPIC ([facility] ONLY) - Abnormal; Notable for the following:    Color, Urine STRAW (*)    APPearance CLEAR (*)    Specific Gravity, Urine 1.004 (*)    Hgb urine dipstick 1+ (*)    Bacteria, UA RARE (*)    Squamous Epithelial / LPF 0-5 (*)    All other components within normal limits   ____________________________________________  RADIOLOGY  Not indicated ____________________________________________   PROCEDURES  Procedure(s) performed: None   ____________________________________________   INITIAL IMPRESSION / ASSESSMENT AND PLAN / ED COURSE  Clinical Course    Pertinent labs & imaging results that were available during my care of the patient were reviewed by me and considered in my medical decision making (see chart for details).  Patient was given a 10 mEq tablet of potassium  prior to discharge. She will receive a prescription for 7 days of additional 10 mEq of potassium. She is to keep her appointment next week for a follow-up and repeat labs. Patient verbalizes understanding. She was advised to return to the ER for symptoms that change or worsen if unable to get an earlier appointment. ____________________________________________   FINAL CLINICAL IMPRESSION(S) / ED DIAGNOSES  Final diagnoses:  Hypokalemia  Muscle cramping       Victorino Dike, FNP 08/30/16 2237    Carrie Mew, MD 08/30/16 2259

## 2016-08-30 NOTE — ED Triage Notes (Signed)
Pt reports to ED w/ c/o L/R hand cramping today and L/R foot cramping yesterday.  Pt sts that cramping has been intermittent >1 week. Denies CP, SOB, LOC, dizziness, injuries or fevers. Skin tone even, ambulatory to triage w/o difficulty. NAD.

## 2016-08-30 NOTE — Discharge Instructions (Signed)
For the next week, take an additional 10 mEq of potassium each night. (Total of 20 mEq at night.) Follow up with your PCP. Call Monday to schedule an appointment. Return to the ER for symptoms that change or worsen if unable to schedule an appointment.

## 2016-09-03 DIAGNOSIS — I10 Essential (primary) hypertension: Secondary | ICD-10-CM | POA: Diagnosis not present

## 2016-09-03 DIAGNOSIS — C50911 Malignant neoplasm of unspecified site of right female breast: Secondary | ICD-10-CM | POA: Diagnosis not present

## 2016-09-03 DIAGNOSIS — M8580 Other specified disorders of bone density and structure, unspecified site: Secondary | ICD-10-CM | POA: Diagnosis not present

## 2016-09-03 DIAGNOSIS — E782 Mixed hyperlipidemia: Secondary | ICD-10-CM | POA: Diagnosis not present

## 2016-09-03 DIAGNOSIS — Z170 Estrogen receptor positive status [ER+]: Secondary | ICD-10-CM | POA: Diagnosis not present

## 2016-09-03 DIAGNOSIS — R7303 Prediabetes: Secondary | ICD-10-CM | POA: Diagnosis not present

## 2016-09-03 DIAGNOSIS — E876 Hypokalemia: Secondary | ICD-10-CM | POA: Diagnosis not present

## 2016-09-03 DIAGNOSIS — Z79899 Other long term (current) drug therapy: Secondary | ICD-10-CM | POA: Diagnosis not present

## 2016-09-03 DIAGNOSIS — I7300 Raynaud's syndrome without gangrene: Secondary | ICD-10-CM | POA: Diagnosis not present

## 2016-09-20 ENCOUNTER — Other Ambulatory Visit: Payer: Self-pay | Admitting: Hematology and Oncology

## 2016-09-23 DIAGNOSIS — D2272 Melanocytic nevi of left lower limb, including hip: Secondary | ICD-10-CM | POA: Diagnosis not present

## 2016-09-23 DIAGNOSIS — D2262 Melanocytic nevi of left upper limb, including shoulder: Secondary | ICD-10-CM | POA: Diagnosis not present

## 2016-09-23 DIAGNOSIS — D2271 Melanocytic nevi of right lower limb, including hip: Secondary | ICD-10-CM | POA: Diagnosis not present

## 2016-09-23 DIAGNOSIS — L821 Other seborrheic keratosis: Secondary | ICD-10-CM | POA: Diagnosis not present

## 2016-09-23 DIAGNOSIS — D225 Melanocytic nevi of trunk: Secondary | ICD-10-CM | POA: Diagnosis not present

## 2016-09-23 DIAGNOSIS — D485 Neoplasm of uncertain behavior of skin: Secondary | ICD-10-CM | POA: Diagnosis not present

## 2016-10-29 DIAGNOSIS — Z853 Personal history of malignant neoplasm of breast: Secondary | ICD-10-CM | POA: Diagnosis not present

## 2016-11-25 ENCOUNTER — Other Ambulatory Visit: Payer: Self-pay | Admitting: Hematology and Oncology

## 2016-11-28 ENCOUNTER — Encounter (INDEPENDENT_AMBULATORY_CARE_PROVIDER_SITE_OTHER): Payer: Self-pay

## 2016-11-28 ENCOUNTER — Inpatient Hospital Stay (HOSPITAL_BASED_OUTPATIENT_CLINIC_OR_DEPARTMENT_OTHER): Payer: Commercial Managed Care - HMO | Admitting: Hematology and Oncology

## 2016-11-28 ENCOUNTER — Inpatient Hospital Stay: Payer: Commercial Managed Care - HMO | Attending: Hematology and Oncology

## 2016-11-28 VITALS — BP 159/78 | HR 82 | Temp 97.6°F | Resp 18 | Wt 145.9 lb

## 2016-11-28 DIAGNOSIS — Z923 Personal history of irradiation: Secondary | ICD-10-CM

## 2016-11-28 DIAGNOSIS — G4700 Insomnia, unspecified: Secondary | ICD-10-CM | POA: Diagnosis not present

## 2016-11-28 DIAGNOSIS — N644 Mastodynia: Secondary | ICD-10-CM | POA: Diagnosis not present

## 2016-11-28 DIAGNOSIS — C50912 Malignant neoplasm of unspecified site of left female breast: Secondary | ICD-10-CM | POA: Insufficient documentation

## 2016-11-28 DIAGNOSIS — C50419 Malignant neoplasm of upper-outer quadrant of unspecified female breast: Secondary | ICD-10-CM

## 2016-11-28 DIAGNOSIS — Z8041 Family history of malignant neoplasm of ovary: Secondary | ICD-10-CM | POA: Diagnosis not present

## 2016-11-28 DIAGNOSIS — Z803 Family history of malignant neoplasm of breast: Secondary | ICD-10-CM

## 2016-11-28 DIAGNOSIS — C50911 Malignant neoplasm of unspecified site of right female breast: Secondary | ICD-10-CM | POA: Insufficient documentation

## 2016-11-28 DIAGNOSIS — I10 Essential (primary) hypertension: Secondary | ICD-10-CM

## 2016-11-28 DIAGNOSIS — E785 Hyperlipidemia, unspecified: Secondary | ICD-10-CM | POA: Insufficient documentation

## 2016-11-28 DIAGNOSIS — M1990 Unspecified osteoarthritis, unspecified site: Secondary | ICD-10-CM | POA: Diagnosis not present

## 2016-11-28 DIAGNOSIS — Y842 Radiological procedure and radiotherapy as the cause of abnormal reaction of the patient, or of later complication, without mention of misadventure at the time of the procedure: Secondary | ICD-10-CM | POA: Insufficient documentation

## 2016-11-28 DIAGNOSIS — Z79899 Other long term (current) drug therapy: Secondary | ICD-10-CM

## 2016-11-28 DIAGNOSIS — Z79811 Long term (current) use of aromatase inhibitors: Secondary | ICD-10-CM

## 2016-11-28 DIAGNOSIS — Z170 Estrogen receptor positive status [ER+]: Secondary | ICD-10-CM | POA: Diagnosis not present

## 2016-11-28 LAB — COMPREHENSIVE METABOLIC PANEL
ALT: 16 U/L (ref 14–54)
AST: 23 U/L (ref 15–41)
Albumin: 4.2 g/dL (ref 3.5–5.0)
Alkaline Phosphatase: 62 U/L (ref 38–126)
Anion gap: 7 (ref 5–15)
BUN: 12 mg/dL (ref 6–20)
CO2: 27 mmol/L (ref 22–32)
Calcium: 9.1 mg/dL (ref 8.9–10.3)
Chloride: 102 mmol/L (ref 101–111)
Creatinine, Ser: 0.84 mg/dL (ref 0.44–1.00)
GFR calc Af Amer: >60 mL/min (ref >60)
GFR calc non Af Amer: >60 mL/min (ref >60)
Glucose, Bld: 116 mg/dL — ABNORMAL HIGH (ref 65–99)
Potassium: 3.3 mmol/L — ABNORMAL LOW (ref 3.5–5.1)
Sodium: 136 mmol/L (ref 135–145)
Total Bilirubin: 0.5 mg/dL (ref 0.3–1.2)
Total Protein: 7.9 g/dL (ref 6.5–8.1)

## 2016-11-28 LAB — CBC WITH DIFFERENTIAL/PLATELET
Basophils Absolute: 0 10*3/uL (ref 0–0.1)
Basophils Relative: 1 %
Eosinophils Absolute: 0.3 10*3/uL (ref 0–0.7)
Eosinophils Relative: 4 %
HCT: 37.4 % (ref 35.0–47.0)
Hemoglobin: 12.2 g/dL (ref 12.0–16.0)
Lymphocytes Relative: 32 %
Lymphs Abs: 2.3 10*3/uL (ref 1.0–3.6)
MCH: 27.2 pg (ref 26.0–34.0)
MCHC: 32.7 g/dL (ref 32.0–36.0)
MCV: 83 fL (ref 80.0–100.0)
Monocytes Absolute: 0.6 10*3/uL (ref 0.2–0.9)
Monocytes Relative: 8 %
Neutro Abs: 4.2 10*3/uL (ref 1.4–6.5)
Neutrophils Relative %: 55 %
Platelets: 421 10*3/uL (ref 150–440)
RBC: 4.51 MIL/uL (ref 3.80–5.20)
RDW: 15.8 % — ABNORMAL HIGH (ref 11.5–14.5)
WBC: 7.4 10*3/uL (ref 3.6–11.0)

## 2016-11-28 NOTE — Progress Notes (Signed)
Tina Johnston day:  11/28/16  Chief Complaint: [name] is a 75 y.o. female with a history of bilateral breast cancer who is seen for 6 month assessment.  HPI: The patient was last seen in the medical oncology Johnston on 11/24/2015.  At that time, she was seen for initial assessment. She noted a catch under each arm. She felt the area was tight.  Exam revealed post-operative and post-radiation changes. Labs included a normal CBC with diff, CMP (except for a potassium of 3.1), and CA27.29 (12.2).  Bilateral mammogram on 05/22/2016 revealed no evidence of malignancy.  During the interim, she has continued her Arimidex.  She notes discomfort in her breasts.  She denies any masses or skin changes.  She was prescribed bras by Dr. Baruch Gouty.  She also notes poor sleep at night and a catnap during the day.  She has tried Tylenol PM and melatonin.   Past Medical History:  Diagnosis Date  . Arthritis   . 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 Breast cyst   . Hyperlipidemia   . Hypertension     Past Surgical History:  Procedure Laterality Date  . ABDOMINAL HYSTERECTOMY  1968  . BREAST BIOPSY Bilateral 2014   Bilateral Bx Both Positive c Bilateral Lumpectomy  . BREAST CYST ASPIRATION Right 2014   Positive  . BREAST SURGERY Bilateral July 15 2013   wide excision w sentinel node biopsy  . SENTINEL LYMPH NODE BIOPSY Bilateral     Family History  Problem Relation Age of Onset  . Cancer Mother 68    breast  . Breast cancer Mother 39  . Ovarian cancer Paternal Grandmother     Social History:  reports that she has never smoked. She has never used smokeless tobacco. She reports that she does not drink alcohol or use drugs.  She lives in [place].  The patient is alone today.  Allergies:  Allergies  Allergen Reactions  . Ultram [Tramadol] Itching    Current Medications: Current Outpatient Prescriptions  Medication Sig  Dispense Refill  . acetaminophen (TYLENOL) 500 MG tablet Take by mouth.    Marland Kitchen amLODipine (NORVASC) 5 MG tablet Take 1 tablet by mouth daily.    Marland Kitchen anastrozole (ARIMIDEX) 1 MG tablet TAKE 1 TABLET EVERY DAY 90 tablet 0  . Calcium Carb-Cholecalciferol 600-800 MG-UNIT TABS Take 1 tablet by mouth 2 (two) times daily.    . fluticasone (FLONASE) 50 MCG/ACT nasal spray Place 1 spray into both nostrils daily.    . furosemide (LASIX) 20 MG tablet Reported on 05/28/2016    . hydrochlorothiazide (MICROZIDE) 12.5 MG capsule Take 12.5 mg by mouth daily.    . meclizine (ANTIVERT) 25 MG tablet     . Multiple Vitamins-Minerals (MULTIVITAMIN WITH MINERALS) tablet Take by mouth.    . potassium chloride (K-DUR) 10 MEQ tablet Take 1 tablet (10 mEq total) by mouth daily. 7 tablet 0  . simvastatin (ZOCOR) 20 MG tablet Take 1 tablet by mouth daily.     No current facility-administered medications for this visit.     Review of Systems:  GENERAL:  Feels good.  No fevers, sweats or weight loss. PERFORMANCE STATUS (ECOG): 0 HEENT:  No visual changes, runny nose, sore throat, mouth sores or tenderness. Lungs: No shortness of breath or cough.  No hemoptysis. Cardiac:  No chest pain, palpitations, orthopnea, or PND. Breasts:  Feels like a cramp in breast. GI:  No nausea,  vomiting, diarrhea, constipation, melena or hematochezia. GU:  No urgency, frequency, dysuria, or hematuria. Musculoskeletal:  Arthritis.  No back pain.  No muscle tenderness. Extremities:  No pain or swelling. Skin:  No rashes or skin changes. Neuro:  No headache, numbness or weakness, balance or coordination issues. Endocrine:  No diabetes, thyroid issues, hot flashes or night sweats. Psych:  No mood changes, depression or anxiety.  Poor sleep. Pain:  No focal pain. Review of systems:  All other systems reviewed and found to be negative.  Physical Exam: Blood pressure (!) 159/84, pulse 89, temperature 97.6 F (36.4 C), temperature source  Tympanic, resp. rate 18, weight 145 lb 15.1 oz (66.2 kg). GENERAL:  Well developed, well nourished, woman sitting comfortably in the exam room in no acute distress. MENTAL STATUS:  Alert and oriented to person, place and time. HEAD:  Styled shoulder length gray hair.  Normocephalic, atraumatic, face symmetric, no Cushingoid features. EYES:  Glasses.  Brown eyes.  Pupils equal round and reactive to light and accomodation.  No conjunctivitis or scleral icterus. ENT:  Oropharynx clear without lesion.  Dentures.  Tongue normal. Mucous membranes moist.  RESPIRATORY:  Clear to auscultation without rales, wheezes or rhonchi. CARDIOVASCULAR:  Regular rate and rhythm without murmur, rub or gallop. BREAST:  Right breast with well healed incision at 9 o'clock position.  Mild post-operative and post-radiation changes.  No masses, skin changes or nipple discharge.  Left breast with well healed superior areolar incision.  Moderate post-operative and post-radiation changes.  Fibrocystic changes.  No masses, skin changes or nipple discharge. Left > right breast tenderness. ABDOMEN:  Soft, non-tender, with active bowel sounds, and no hepatosplenomegaly.  No masses. SKIN:  No rashes, ulcers or lesions. EXTREMITIES: No edema, no skin discoloration or tenderness.  No palpable cords. LYMPH NODES: No palpable cervical, supraclavicular, axillary or inguinal adenopathy  NEUROLOGICAL: Unremarkable. PSYCH:  Appropriate.   Appointment on 11/28/2016  Component Date Value Ref Range Status  . WBC 11/28/2016 7.4  3.6 - 11.0 K/uL Final  . RBC 11/28/2016 4.51  3.80 - 5.20 MIL/uL Final  . Hemoglobin 11/28/2016 12.2  12.0 - 16.0 g/dL Final  . HCT 11/28/2016 37.4  35.0 - 47.0 % Final  . MCV 11/28/2016 83.0  80.0 - 100.0 fL Final  . MCH 11/28/2016 27.2  26.0 - 34.0 pg Final  . MCHC 11/28/2016 32.7  32.0 - 36.0 g/dL Final  . RDW 11/28/2016 15.8* 11.5 - 14.5 % Final  . Platelets 11/28/2016 421  150 - 440 K/uL Final  .  Neutrophils Relative % 11/28/2016 55  % Final  . Neutro Abs 11/28/2016 4.2  1.4 - 6.5 K/uL Final  . Lymphocytes Relative 11/28/2016 32  % Final  . Lymphs Abs 11/28/2016 2.3  1.0 - 3.6 K/uL Final  . Monocytes Relative 11/28/2016 8  % Final  . Monocytes Absolute 11/28/2016 0.6  0.2 - 0.9 K/uL Final  . Eosinophils Relative 11/28/2016 4  % Final  . Eosinophils Absolute 11/28/2016 0.3  0 - 0.7 K/uL Final  . Basophils Relative 11/28/2016 1  % Final  . Basophils Absolute 11/28/2016 0.0  0 - 0.1 K/uL Final  . Sodium 11/28/2016 136  135 - 145 mmol/L Final  . Potassium 11/28/2016 3.3* 3.5 - 5.1 mmol/L Final  . Chloride 11/28/2016 102  101 - 111 mmol/L Final  . CO2 11/28/2016 27  22 - 32 mmol/L Final  . Glucose, Bld 11/28/2016 116* 65 - 99 mg/dL Final  . BUN 11/28/2016 12  6 - 20 mg/dL Final  . Creatinine, Ser 11/28/2016 0.84  0.44 - 1.00 mg/dL Final  . Calcium 11/28/2016 9.1  8.9 - 10.3 mg/dL Final  . Total Protein 11/28/2016 7.9  6.5 - 8.1 g/dL Final  . Albumin 11/28/2016 4.2  3.5 - 5.0 g/dL Final  . AST 11/28/2016 23  15 - 41 U/L Final  . ALT 11/28/2016 16  14 - 54 U/L Final  . Alkaline Phosphatase 11/28/2016 62  38 - 126 U/L Final  . Total Bilirubin 11/28/2016 0.5  0.3 - 1.2 mg/dL Final  . GFR calc non Af Amer 11/28/2016 >60  >60 mL/min Final  . GFR calc Af Amer 11/28/2016 >60  >60 mL/min Final   Comment: (NOTE) The eGFR has been calculated using the CKD EPI equation. This calculation has not been validated in all clinical situations. eGFR's persistently <60 mL/min signify possible Chronic Kidney Disease.   . Anion gap 11/28/2016 7  5 - 15 Final    Assessment:  CARIANN KINNAMON is a 75 y.o. female with bilateral breast cancer s/p bilateral lumpectomy and sentinel lymph node biopsy on 07/15/2013.  She received a bilateral breast radiation of 5040 cGy from 09/07/2013 until 10/14/2013. She then went underwent left and right scar boost of 1600 cGy from 10/15/2013 until 10/26/2013.  She has  been on Arimidex since 10/25/2013.   Right breast pathology revealed a 1.1 cm, grade I, invasive mammary carcinoma. Two sentinel lymph nodes were negative. Tumor was ER positive (greater than 90%), PR positive (1%), and HER-2/neu negative.  Oncotype DX testing on 08/24/2013 revealed a recurrence score of 15 (low risk) which corresponded to a 10 year risk of distant recurrence with tamoxifen alone of 9%.  Left breast pathology revealed 2 foci of 0.6 cm each. Three sentinel lymph nodes were negative.  Tumor was ER positive (90%), PR positive (80%), and HER-2/neu negative.   Bilateral mammogram on 05/22/2016 revealed no evidence of malignancy.    CA27.29 has been followed: 15.0 on 05/25/2014, 15.3 on 11/23/2014, 12.2 on 11/24/2015, 12.9 on 05/28/2016, and 15.2 on 11/28/2016.  Symptomatically, she has breast tenderness.  Exam is stable.  Potassium is low (3.3).  Plan: 1. Labs today:  CBC with diff, CMP, CA27.29. 2. Review interval mammogram-done. 3. Continue Arimidex. 4. Bilateral mammogram on 05/22/2017. 5. Bone density study. 6. Discuss slightly low potassium.  She is on HCTZ and potassium supplementation.  FAX labs to Dr. Carrie Mew. 7. RTC in 6 months for MD assessment and labs (CBC with diff, CMP, CA27.29).   Lequita Asal, MD  11/28/2016, 11:20 AM

## 2016-11-28 NOTE — Progress Notes (Signed)
Patient c/o fingers getting numb and tingling if she is holding something for a long time or if her hands are cold.  Patient recently saw a dermatologist for an area on her left leg.

## 2016-11-29 LAB — CANCER ANTIGEN 27.29: CA 27.29: 15.2 U/mL (ref 0.0–38.6)

## 2016-12-10 DIAGNOSIS — H2513 Age-related nuclear cataract, bilateral: Secondary | ICD-10-CM | POA: Diagnosis not present

## 2017-01-03 DIAGNOSIS — C50912 Malignant neoplasm of unspecified site of left female breast: Secondary | ICD-10-CM | POA: Diagnosis not present

## 2017-01-03 DIAGNOSIS — Z170 Estrogen receptor positive status [ER+]: Secondary | ICD-10-CM | POA: Diagnosis not present

## 2017-01-03 DIAGNOSIS — E782 Mixed hyperlipidemia: Secondary | ICD-10-CM | POA: Diagnosis not present

## 2017-01-03 DIAGNOSIS — R7303 Prediabetes: Secondary | ICD-10-CM | POA: Diagnosis not present

## 2017-01-03 DIAGNOSIS — I10 Essential (primary) hypertension: Secondary | ICD-10-CM | POA: Diagnosis not present

## 2017-01-03 DIAGNOSIS — Z79899 Other long term (current) drug therapy: Secondary | ICD-10-CM | POA: Diagnosis not present

## 2017-01-03 DIAGNOSIS — C50911 Malignant neoplasm of unspecified site of right female breast: Secondary | ICD-10-CM | POA: Diagnosis not present

## 2017-01-14 ENCOUNTER — Ambulatory Visit
Admission: RE | Admit: 2017-01-14 | Discharge: 2017-01-14 | Disposition: A | Discharge status: Home / Self Care | Payer: Medicare HMO | Source: Ambulatory Visit | Attending: Hematology and Oncology | Admitting: Hematology and Oncology

## 2017-01-14 DIAGNOSIS — C50419 Malignant neoplasm of upper-outer quadrant of unspecified female breast: Secondary | ICD-10-CM | POA: Diagnosis not present

## 2017-01-14 DIAGNOSIS — M85852 Other specified disorders of bone density and structure, left thigh: Secondary | ICD-10-CM | POA: Diagnosis not present

## 2017-01-14 DIAGNOSIS — Z170 Estrogen receptor positive status [ER+]: Secondary | ICD-10-CM | POA: Insufficient documentation

## 2017-01-29 ENCOUNTER — Other Ambulatory Visit: Payer: Self-pay | Admitting: Hematology and Oncology

## 2017-02-20 ENCOUNTER — Encounter: Payer: Self-pay | Admitting: Hematology and Oncology

## 2017-03-03 ENCOUNTER — Telehealth: Payer: Self-pay | Admitting: *Deleted

## 2017-03-03 NOTE — Telephone Encounter (Signed)
Called patient and reported to her that her bone density study revealed osteopenia and she reports that she is taking calcium and vit d. Patient will call dentist about clearance and call us back.

## 2017-03-05 ENCOUNTER — Telehealth: Payer: Self-pay | Admitting: *Deleted

## 2017-03-05 NOTE — Telephone Encounter (Signed)
Called patient back after her call to Triage regarding questions about a shot she is supposed to be getting for her bones.    I spoke with Dr. Mike Gip to verify that recommendation has been made for patient to get Prolia.  Called patient and discussed with her about Prolia and why MD feels it is necessary.  Also explained to her the reasoning behind her getting dental clearance in spite of the fact that she is edentulous. Patient states she is seeing a dentist this afternoon. She will drop the letter off at registration desk in [place] in the next few days.

## 2017-03-13 ENCOUNTER — Telehealth: Payer: Self-pay | Admitting: *Deleted

## 2017-03-13 NOTE — Telephone Encounter (Signed)
Dr. Willette Alma office reports that Tina Johnston has been examined and found no abnormalities.  Patient's gingiva and ridges are looking well.  Call for questions.

## 2017-04-02 ENCOUNTER — Other Ambulatory Visit: Payer: Self-pay | Admitting: Hematology and Oncology

## 2017-05-08 ENCOUNTER — Ambulatory Visit
Admission: RE | Admit: 2017-05-08 | Discharge: 2017-05-08 | Disposition: A | Discharge status: Home / Self Care | Payer: Medicare HMO | Source: Ambulatory Visit | Attending: Radiation Oncology | Admitting: Radiation Oncology

## 2017-05-08 VITALS — BP 165/74 | HR 73 | Temp 97.8°F | Resp 18 | Wt 147.2 lb

## 2017-05-08 DIAGNOSIS — C50912 Malignant neoplasm of unspecified site of left female breast: Secondary | ICD-10-CM

## 2017-05-08 DIAGNOSIS — Z923 Personal history of irradiation: Secondary | ICD-10-CM | POA: Diagnosis not present

## 2017-05-08 DIAGNOSIS — Z79811 Long term (current) use of aromatase inhibitors: Secondary | ICD-10-CM | POA: Insufficient documentation

## 2017-05-08 DIAGNOSIS — C50911 Malignant neoplasm of unspecified site of right female breast: Secondary | ICD-10-CM

## 2017-05-08 DIAGNOSIS — D0512 Intraductal carcinoma in situ of left breast: Secondary | ICD-10-CM | POA: Diagnosis not present

## 2017-05-08 DIAGNOSIS — D0511 Intraductal carcinoma in situ of right breast: Secondary | ICD-10-CM | POA: Diagnosis not present

## 2017-05-08 DIAGNOSIS — Z170 Estrogen receptor positive status [ER+]: Secondary | ICD-10-CM

## 2017-05-08 NOTE — Progress Notes (Signed)
Radiation Oncology Follow up Note  Name: Tina Johnston   Date:   05/08/2017 MRN:  [id number] DOB: 1941/09/27    This 76 y.o. female presents to the clinic today for 3 and half year follow-up status post bilateral breast radiation.Marland Kitchen  REFERRING PROVIDER: Marinda Elk, MD  HPI: Patient is a 76 year old female now out 3/2 years having completed whole breast radiation to her bilateral breasts. 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 She is seen today in routine follow-up and is doing well. She specifically denies breast tenderness cough or bone pain. She's currently on anastrozole tolerating that well without side effect. Her last mammograms were in. May 2017 both BI-RADS 2 benign. She is scheduled for repeat mammograms this June.  COMPLICATIONS OF TREATMENT: none  FOLLOW UP COMPLIANCE: keeps appointments   PHYSICAL EXAM:  BP (!) 165/74   Pulse 73   Temp 97.8 F (36.6 C)   Resp 18   Wt 147 lb 2.5 oz (66.7 kg)   BMI 26.07 kg/m  Lungs are clear to A&P cardiac examination essentially unremarkable with regular rate and rhythm. No dominant mass or nodularity is noted in either breast in 2 positions examined. Incision is well-healed. No axillary or supraclavicular adenopathy is appreciated. Cosmetic result is excellent. Well-developed well-nourished patient in NAD. HEENT reveals PERLA, EOMI, discs not visualized.  Oral cavity is clear. No oral mucosal lesions are identified. Neck is clear without evidence of cervical or supraclavicular adenopathy. Lungs are clear to A&P. Cardiac examination is essentially unremarkable with regular rate and rhythm without murmur rub or thrill. Abdomen is benign with no organomegaly or masses noted. Motor sensory and DTR levels are equal and symmetric in the upper and lower extremities. Cranial nerves II  through XII are grossly intact. Proprioception is intact. No peripheral adenopathy or edema is identified. No motor or sensory levels are noted. Crude visual fields are within normal range.  RADIOLOGY RESULTS: Mammograms are reviewed and compatible with the above-stated findings  PLAN: At the present time patient is doing well with no evidence of disease now out 3/2 years. I'm please were overall progress. She continues on anastrozole without side effect. She is scheduled for follow-up mammograms in June of this year. I have asked to see her back in 1 year for follow-up and then will discontinue follow-up care. Patient knows to call with any concerns.  I would like to take this opportunity to thank you for allowing me to participate in the care of your patient.Armstead Peaks., MD

## 2017-05-27 ENCOUNTER — Ambulatory Visit
Admission: RE | Admit: 2017-05-27 | Discharge: 2017-05-27 | Disposition: A | Discharge status: Home / Self Care | Payer: Medicare HMO | Source: Ambulatory Visit | Attending: Hematology and Oncology | Admitting: Hematology and Oncology

## 2017-05-27 ENCOUNTER — Other Ambulatory Visit: Payer: Self-pay | Admitting: Hematology and Oncology

## 2017-05-27 DIAGNOSIS — R922 Inconclusive mammogram: Secondary | ICD-10-CM | POA: Diagnosis not present

## 2017-05-27 DIAGNOSIS — C50419 Malignant neoplasm of upper-outer quadrant of unspecified female breast: Secondary | ICD-10-CM

## 2017-05-27 DIAGNOSIS — Z170 Estrogen receptor positive status [ER+]: Principal | ICD-10-CM

## 2017-05-27 DIAGNOSIS — Z853 Personal history of malignant neoplasm of breast: Secondary | ICD-10-CM | POA: Insufficient documentation

## 2017-05-29 ENCOUNTER — Ambulatory Visit: Payer: Commercial Managed Care - HMO | Admitting: Hematology and Oncology

## 2017-05-29 ENCOUNTER — Other Ambulatory Visit: Payer: Commercial Managed Care - HMO

## 2017-06-05 ENCOUNTER — Other Ambulatory Visit: Payer: Self-pay | Admitting: Hematology and Oncology

## 2017-06-12 ENCOUNTER — Other Ambulatory Visit: Payer: Self-pay | Admitting: *Deleted

## 2017-06-12 ENCOUNTER — Other Ambulatory Visit: Payer: Self-pay | Admitting: Hematology and Oncology

## 2017-06-12 ENCOUNTER — Inpatient Hospital Stay: Payer: Medicare HMO | Attending: Hematology and Oncology

## 2017-06-12 ENCOUNTER — Inpatient Hospital Stay (HOSPITAL_BASED_OUTPATIENT_CLINIC_OR_DEPARTMENT_OTHER): Payer: Medicare HMO | Admitting: Hematology and Oncology

## 2017-06-12 ENCOUNTER — Encounter: Payer: Self-pay | Admitting: Hematology and Oncology

## 2017-06-12 VITALS — BP 144/75 | HR 68 | Temp 97.7°F | Resp 18 | Wt 147.1 lb

## 2017-06-12 DIAGNOSIS — C50419 Malignant neoplasm of upper-outer quadrant of unspecified female breast: Secondary | ICD-10-CM

## 2017-06-12 DIAGNOSIS — Z923 Personal history of irradiation: Secondary | ICD-10-CM | POA: Insufficient documentation

## 2017-06-12 DIAGNOSIS — C50912 Malignant neoplasm of unspecified site of left female breast: Secondary | ICD-10-CM

## 2017-06-12 DIAGNOSIS — Z79899 Other long term (current) drug therapy: Secondary | ICD-10-CM | POA: Diagnosis not present

## 2017-06-12 DIAGNOSIS — Z79811 Long term (current) use of aromatase inhibitors: Secondary | ICD-10-CM | POA: Diagnosis not present

## 2017-06-12 DIAGNOSIS — Z170 Estrogen receptor positive status [ER+]: Secondary | ICD-10-CM | POA: Insufficient documentation

## 2017-06-12 DIAGNOSIS — C50911 Malignant neoplasm of unspecified site of right female breast: Secondary | ICD-10-CM

## 2017-06-12 DIAGNOSIS — I10 Essential (primary) hypertension: Secondary | ICD-10-CM | POA: Diagnosis not present

## 2017-06-12 DIAGNOSIS — E785 Hyperlipidemia, unspecified: Secondary | ICD-10-CM | POA: Diagnosis not present

## 2017-06-12 DIAGNOSIS — M85859 Other specified disorders of bone density and structure, unspecified thigh: Secondary | ICD-10-CM

## 2017-06-12 DIAGNOSIS — M8580 Other specified disorders of bone density and structure, unspecified site: Secondary | ICD-10-CM

## 2017-06-12 DIAGNOSIS — M85852 Other specified disorders of bone density and structure, left thigh: Secondary | ICD-10-CM | POA: Insufficient documentation

## 2017-06-12 DIAGNOSIS — Z853 Personal history of malignant neoplasm of breast: Secondary | ICD-10-CM

## 2017-06-12 LAB — CBC WITH DIFFERENTIAL/PLATELET
Basophils Absolute: 0 10*3/uL (ref 0–0.1)
Basophils Relative: 1 %
Eosinophils Absolute: 0.3 10*3/uL (ref 0–0.7)
Eosinophils Relative: 4 %
HCT: 36.3 % (ref 35.0–47.0)
Hemoglobin: 12.3 g/dL (ref 12.0–16.0)
Lymphocytes Relative: 31 %
Lymphs Abs: 2.1 10*3/uL (ref 1.0–3.6)
MCH: 27.6 pg (ref 26.0–34.0)
MCHC: 33.9 g/dL (ref 32.0–36.0)
MCV: 81.5 fL (ref 80.0–100.0)
Monocytes Absolute: 0.5 10*3/uL (ref 0.2–0.9)
Monocytes Relative: 8 %
Neutro Abs: 3.9 10*3/uL (ref 1.4–6.5)
Neutrophils Relative %: 56 %
Platelets: 412 10*3/uL (ref 150–440)
RBC: 4.45 MIL/uL (ref 3.80–5.20)
RDW: 15.6 % — ABNORMAL HIGH (ref 11.5–14.5)
WBC: 6.8 10*3/uL (ref 3.6–11.0)

## 2017-06-12 LAB — COMPREHENSIVE METABOLIC PANEL
ALT: 15 U/L (ref 14–54)
AST: 21 U/L (ref 15–41)
Albumin: 4.3 g/dL (ref 3.5–5.0)
Alkaline Phosphatase: 58 U/L (ref 38–126)
Anion gap: 6 (ref 5–15)
BUN: 16 mg/dL (ref 6–20)
CO2: 29 mmol/L (ref 22–32)
Calcium: 9.7 mg/dL (ref 8.9–10.3)
Chloride: 104 mmol/L (ref 101–111)
Creatinine, Ser: 0.91 mg/dL (ref 0.44–1.00)
GFR calc Af Amer: >60 mL/min (ref >60)
GFR calc non Af Amer: 59 mL/min — ABNORMAL LOW (ref >60)
Glucose, Bld: 99 mg/dL (ref 65–99)
Potassium: 3.5 mmol/L (ref 3.5–5.1)
Sodium: 139 mmol/L (ref 135–145)
Total Bilirubin: 0.7 mg/dL (ref 0.3–1.2)
Total Protein: 7.8 g/dL (ref 6.5–8.1)

## 2017-06-12 NOTE — Progress Notes (Signed)
Patient here today for follow up regarding breast cancer.  Patient does states she has a cramping feeling in her ribs under her arms.  She has discussed this with Dr. Donella Stade and he told her it is scar tissue. Patient states she has a rash on her legs that looks like poison ivy.  States she walks and crosses a little bridge that has a lot of growth beside it.

## 2017-06-12 NOTE — Progress Notes (Signed)
Patient provided with written information on Breast cancer index.

## 2017-06-12 NOTE — Progress Notes (Signed)
[facility] day:  06/12/17  Chief Complaint: Tina Johnston is a 76 y.o. female with a history of bilateral breast cancer who is seen for 6 month assessment.  HPI: The patient was last seen in the medical oncology clinic on 11/28/2016.  At that time, she noted discomfort in her breasts.  Exam was stable.  She continued Armidex.  Bone density on 01/14/2017 revealed osteopenia with a T-score of -1.7 in the left femoral neck.  Bilateral mammogram on 05/27/2017 revealed no evidence of malignancy.  During the interim, she has felt some tightness under her arms which improves with movement.  She was told that it was scar tissue by Dr. Baruch Gouty.   Past Medical History:  Diagnosis Date  . Arthritis   . Breast cancer ([place]) 2014   Bilateral- radiation  . Breast cyst   . Hyperlipidemia   . Hypertension     Past Surgical History:  Procedure Laterality Date  . ABDOMINAL HYSTERECTOMY  1968  . BREAST BIOPSY Bilateral 2014   Bilateral Bx Both Positive c Bilateral Lumpectomy  . BREAST CYST ASPIRATION Right 2014   Positive  . BREAST LUMPECTOMY Bilateral 2014  . BREAST SURGERY Bilateral July 15 2013   wide excision w sentinel node biopsy  . SENTINEL LYMPH NODE BIOPSY Bilateral     Family History  Problem Relation Age of Onset  . Cancer Mother 44       breast  . Breast cancer Mother 70  . Ovarian cancer Paternal Grandmother     Social History:  reports that she has never smoked. She has never used smokeless tobacco. She reports that she does not drink alcohol or use drugs.  She lives in [place].  The patient is alone today.  Allergies:  Allergies  Allergen Reactions  . Ultram [Tramadol] Itching    Current Medications: Current Outpatient Prescriptions  Medication Sig Dispense Refill  . acetaminophen (TYLENOL) 500 MG tablet Take by mouth.    Marland Kitchen amLODipine (NORVASC) 5 MG tablet Take 1 tablet by mouth daily.    Marland Kitchen anastrozole  (ARIMIDEX) 1 MG tablet TAKE 1 TABLET EVERY DAY 90 tablet 0  . Calcium Carb-Cholecalciferol 600-800 MG-UNIT TABS Take 1 tablet by mouth 2 (two) times daily.    . fluticasone (FLONASE) 50 MCG/ACT nasal spray Place 1 spray into both nostrils daily.    . hydrochlorothiazide (MICROZIDE) 12.5 MG capsule Take 12.5 mg by mouth daily.    . meclizine (ANTIVERT) 25 MG tablet     . Multiple Vitamins-Minerals (MULTIVITAMIN WITH MINERALS) tablet Take by mouth.    . potassium chloride (K-DUR) 10 MEQ tablet Take 1 tablet (10 mEq total) by mouth daily. 7 tablet 0  . simvastatin (ZOCOR) 20 MG tablet Take 1 tablet by mouth daily.    . furosemide (LASIX) 20 MG tablet Reported on 05/28/2016     No current facility-administered medications for this visit.     Review of Systems:  GENERAL:  Feels good.  No fevers or sweats.  Weight gain of 2 pounds. PERFORMANCE STATUS (ECOG): 0 HEENT:  No visual changes, runny nose, sore throat, mouth sores or tenderness. Lungs: No shortness of breath or cough.  No hemoptysis. Cardiac:  No chest pain, palpitations, orthopnea, or PND. Breasts:  Feels like a cramp in RIGHT breast; improves with movement. Tenderness and slight swelling to breasts (L>R) s/p radiation. GI:  No nausea, vomiting, diarrhea, constipation, melena or hematochezia. GU:  No urgency, frequency, dysuria,  or hematuria. Musculoskeletal:  Arthritis.  No back pain.  No muscle tenderness. Extremities:  No pain or swelling. Skin:  No rashes or skin changes. Neuro:  No headache, numbness or weakness, balance or coordination issues. Endocrine:  No diabetes, thyroid issues, hot flashes or night sweats. Psych:  No mood changes, depression or anxiety.  Poor sleep. Pain:  No focal pain. Review of systems:  All other systems reviewed and found to be negative.  Physical Exam: Blood pressure (!) 144/75, pulse 68, temperature 97.7 F (36.5 C), temperature source Tympanic, resp. rate 18, weight 147 lb 1 oz (66.7  kg). GENERAL:  Well developed, well nourished, woman sitting comfortably in the exam room in no acute distress. MENTAL STATUS:  Alert and oriented to person, place and time. HEAD:  Curly gray hair.  Normocephalic, atraumatic, face symmetric, no Cushingoid features. EYES:  Glasses.  Brown eyes.  Pupils equal round and reactive to light and accomodation.  No conjunctivitis or scleral icterus. ENT:  Oropharynx clear without lesion.  Dentures.  Tongue normal. Mucous membranes moist.  RESPIRATORY:  Clear to auscultation without rales, wheezes or rhonchi. CARDIOVASCULAR:  Regular rate and rhythm without murmur, rub or gallop. BREAST:  Right breast with well healed incision at 9 o'clock position.  Mild post-operative and post-radiation changes laterally.  No masses, skin changes or nipple discharge.  Left breast with well healed superior areolar incision.  Moderate post-operative and post-radiation changes.  Fibrocystic changes.  No masses, skin changes or nipple discharge. Left > right breast tenderness. ABDOMEN:  Soft, non-tender, with active bowel sounds, and no hepatosplenomegaly.  No masses. SKIN:  No rashes, ulcers or lesions. EXTREMITIES: No edema, no skin discoloration or tenderness.  No palpable cords. LYMPH NODES: No palpable cervical, supraclavicular, axillary or inguinal adenopathy  NEUROLOGICAL: Unremarkable. PSYCH:  Appropriate.   Appointment on 06/12/2017  Component Date Value Ref Range Status  . WBC 06/12/2017 6.8  3.6 - 11.0 K/uL Final  . RBC 06/12/2017 4.45  3.80 - 5.20 MIL/uL Final  . Hemoglobin 06/12/2017 12.3  12.0 - 16.0 g/dL Final  . HCT 06/12/2017 36.3  35.0 - 47.0 % Final  . MCV 06/12/2017 81.5  80.0 - 100.0 fL Final  . MCH 06/12/2017 27.6  26.0 - 34.0 pg Final  . MCHC 06/12/2017 33.9  32.0 - 36.0 g/dL Final  . RDW 06/12/2017 15.6* 11.5 - 14.5 % Final  . Platelets 06/12/2017 412  150 - 440 K/uL Final  . Neutrophils Relative % 06/12/2017 56  % Final  . Neutro Abs  06/12/2017 3.9  1.4 - 6.5 K/uL Final  . Lymphocytes Relative 06/12/2017 31  % Final  . Lymphs Abs 06/12/2017 2.1  1.0 - 3.6 K/uL Final  . Monocytes Relative 06/12/2017 8  % Final  . Monocytes Absolute 06/12/2017 0.5  0.2 - 0.9 K/uL Final  . Eosinophils Relative 06/12/2017 4  % Final  . Eosinophils Absolute 06/12/2017 0.3  0 - 0.7 K/uL Final  . Basophils Relative 06/12/2017 1  % Final  . Basophils Absolute 06/12/2017 0.0  0 - 0.1 K/uL Final  . Sodium 06/12/2017 139  135 - 145 mmol/L Final  . Potassium 06/12/2017 3.5  3.5 - 5.1 mmol/L Final  . Chloride 06/12/2017 104  101 - 111 mmol/L Final  . CO2 06/12/2017 29  22 - 32 mmol/L Final  . Glucose, Bld 06/12/2017 99  65 - 99 mg/dL Final  . BUN 06/12/2017 16  6 - 20 mg/dL Final  . Creatinine, Ser 06/12/2017  0.91  0.44 - 1.00 mg/dL Final  . Calcium 06/12/2017 9.7  8.9 - 10.3 mg/dL Final  . Total Protein 06/12/2017 7.8  6.5 - 8.1 g/dL Final  . Albumin 06/12/2017 4.3  3.5 - 5.0 g/dL Final  . AST 06/12/2017 21  15 - 41 U/L Final  . ALT 06/12/2017 15  14 - 54 U/L Final  . Alkaline Phosphatase 06/12/2017 58  38 - 126 U/L Final  . Total Bilirubin 06/12/2017 0.7  0.3 - 1.2 mg/dL Final  . GFR calc non Af Amer 06/12/2017 59* >60 mL/min Final  . GFR calc Af Amer 06/12/2017 >60  >60 mL/min Final   Comment: (NOTE) The eGFR has been calculated using the CKD EPI equation. This calculation has not been validated in all clinical situations. eGFR's persistently <60 mL/min signify possible Chronic Kidney Disease.   . Anion gap 06/12/2017 6  5 - 15 Final    Assessment:  Tina Johnston is a 76 y.o. female with bilateral breast cancer s/p bilateral lumpectomy and sentinel lymph node biopsy on 07/15/2013.  She received a bilateral breast radiation of 5040 cGy from 09/07/2013 until 10/14/2013. She then went underwent left and right scar boost of 1600 cGy from 10/15/2013 until 10/26/2013.  She has been on Arimidex since 10/25/2013.   Right breast pathology  revealed a 1.1 cm, grade I, invasive mammary carcinoma. Two sentinel lymph nodes were negative. Tumor was ER positive (greater than 90%), PR positive (1%), and HER-2/neu negative.  Oncotype DX testing on 08/24/2013 revealed a recurrence score of 15 (low risk) which corresponded to a 10 year risk of distant recurrence with tamoxifen alone of 9%.  Left breast pathology revealed 2 foci of 0.6 cm each. Three sentinel lymph nodes were negative.  Tumor was ER positive (90%), PR positive (80%), and HER-2/neu negative.   Bilateral mammogram on 05/27/2017 revealed no evidence of malignancy.    CA27.29 has been followed: 15.0 on 05/25/2014, 15.3 on 11/23/2014, 12.2 on 11/24/2015, 12.9 on 05/28/2016, and 15.2 on 11/28/2016.  Bone density on 01/14/2017 revealed osteopenia with a T-score of -1.7 in the left femoral neck.   Symptomatically, she has a cramping feeling under her arms secondary to scarring.  Exam is stable.   Plan: 1. Labs today:  CBC with diff, CMP, CA27.29. 2. Review interval mammogram.  No evidence of malignancy. 3. Discuss bone density.  Patient has osteopenia.  Discuss calcium 1200 mg/day, vitamin D 800 IU, and Prolia. 4. Preauth Prolia. 5. RTC for labs (BMP) + Prolia after preauth. 6. Continue Arimidex. 7. Provided information on BCI testing.  Discuss adjuvant hormonal therapy for 5 versus 10 years.  Testing will be ordered in 04/2018. 8.   RTC in 6 months for MD assessment and labs (CBC with diff, CMP, CA27.29).   Lequita Asal, MD  06/12/2017, 11:23 AM

## 2017-06-13 LAB — CANCER ANTIGEN 27.29: CA 27.29: 15 U/mL (ref 0.0–38.6)

## 2017-06-26 DIAGNOSIS — M85852 Other specified disorders of bone density and structure, left thigh: Secondary | ICD-10-CM | POA: Diagnosis not present

## 2017-06-26 DIAGNOSIS — I10 Essential (primary) hypertension: Secondary | ICD-10-CM | POA: Diagnosis not present

## 2017-06-26 DIAGNOSIS — Z79899 Other long term (current) drug therapy: Secondary | ICD-10-CM | POA: Diagnosis not present

## 2017-06-26 DIAGNOSIS — E782 Mixed hyperlipidemia: Secondary | ICD-10-CM | POA: Diagnosis not present

## 2017-06-26 DIAGNOSIS — R7303 Prediabetes: Secondary | ICD-10-CM | POA: Diagnosis not present

## 2017-07-03 DIAGNOSIS — M85852 Other specified disorders of bone density and structure, left thigh: Secondary | ICD-10-CM | POA: Diagnosis not present

## 2017-07-03 DIAGNOSIS — E782 Mixed hyperlipidemia: Secondary | ICD-10-CM | POA: Diagnosis not present

## 2017-07-03 DIAGNOSIS — Z0000 Encounter for general adult medical examination without abnormal findings: Secondary | ICD-10-CM | POA: Diagnosis not present

## 2017-07-03 DIAGNOSIS — R7303 Prediabetes: Secondary | ICD-10-CM | POA: Diagnosis not present

## 2017-07-03 DIAGNOSIS — M150 Primary generalized (osteo)arthritis: Secondary | ICD-10-CM | POA: Diagnosis not present

## 2017-07-03 DIAGNOSIS — I10 Essential (primary) hypertension: Secondary | ICD-10-CM | POA: Diagnosis not present

## 2017-08-06 ENCOUNTER — Other Ambulatory Visit: Payer: Self-pay | Admitting: Hematology and Oncology

## 2017-10-08 ENCOUNTER — Other Ambulatory Visit: Payer: Self-pay | Admitting: Hematology and Oncology

## 2017-11-26 DIAGNOSIS — H5203 Hypermetropia, bilateral: Secondary | ICD-10-CM | POA: Diagnosis not present

## 2017-11-26 DIAGNOSIS — H2513 Age-related nuclear cataract, bilateral: Secondary | ICD-10-CM | POA: Diagnosis not present

## 2017-11-26 DIAGNOSIS — H524 Presbyopia: Secondary | ICD-10-CM | POA: Diagnosis not present

## 2017-11-26 DIAGNOSIS — I10 Essential (primary) hypertension: Secondary | ICD-10-CM | POA: Diagnosis not present

## 2017-11-26 DIAGNOSIS — H52223 Regular astigmatism, bilateral: Secondary | ICD-10-CM | POA: Diagnosis not present

## 2017-11-26 DIAGNOSIS — H35412 Lattice degeneration of retina, left eye: Secondary | ICD-10-CM | POA: Diagnosis not present

## 2017-12-04 ENCOUNTER — Inpatient Hospital Stay (HOSPITAL_BASED_OUTPATIENT_CLINIC_OR_DEPARTMENT_OTHER): Payer: Medicare HMO | Admitting: Hematology and Oncology

## 2017-12-04 ENCOUNTER — Inpatient Hospital Stay: Payer: Medicare HMO

## 2017-12-04 ENCOUNTER — Inpatient Hospital Stay: Payer: Medicare HMO | Attending: Hematology and Oncology

## 2017-12-04 ENCOUNTER — Other Ambulatory Visit: Payer: Self-pay

## 2017-12-04 ENCOUNTER — Other Ambulatory Visit: Payer: Self-pay | Admitting: Hematology and Oncology

## 2017-12-04 VITALS — BP 170/76 | HR 74 | Temp 96.5°F | Resp 18 | Wt 147.9 lb

## 2017-12-04 DIAGNOSIS — C50912 Malignant neoplasm of unspecified site of left female breast: Secondary | ICD-10-CM | POA: Insufficient documentation

## 2017-12-04 DIAGNOSIS — Z170 Estrogen receptor positive status [ER+]: Secondary | ICD-10-CM | POA: Insufficient documentation

## 2017-12-04 DIAGNOSIS — Z79899 Other long term (current) drug therapy: Secondary | ICD-10-CM | POA: Diagnosis not present

## 2017-12-04 DIAGNOSIS — E785 Hyperlipidemia, unspecified: Secondary | ICD-10-CM | POA: Diagnosis not present

## 2017-12-04 DIAGNOSIS — I10 Essential (primary) hypertension: Secondary | ICD-10-CM | POA: Diagnosis not present

## 2017-12-04 DIAGNOSIS — M8580 Other specified disorders of bone density and structure, unspecified site: Secondary | ICD-10-CM | POA: Insufficient documentation

## 2017-12-04 DIAGNOSIS — Z79811 Long term (current) use of aromatase inhibitors: Secondary | ICD-10-CM | POA: Insufficient documentation

## 2017-12-04 DIAGNOSIS — M1990 Unspecified osteoarthritis, unspecified site: Secondary | ICD-10-CM | POA: Insufficient documentation

## 2017-12-04 DIAGNOSIS — Z8041 Family history of malignant neoplasm of ovary: Secondary | ICD-10-CM

## 2017-12-04 DIAGNOSIS — C50419 Malignant neoplasm of upper-outer quadrant of unspecified female breast: Secondary | ICD-10-CM

## 2017-12-04 DIAGNOSIS — Z803 Family history of malignant neoplasm of breast: Secondary | ICD-10-CM | POA: Diagnosis not present

## 2017-12-04 DIAGNOSIS — Z90710 Acquired absence of both cervix and uterus: Secondary | ICD-10-CM

## 2017-12-04 DIAGNOSIS — Z923 Personal history of irradiation: Secondary | ICD-10-CM

## 2017-12-04 DIAGNOSIS — C50911 Malignant neoplasm of unspecified site of right female breast: Secondary | ICD-10-CM | POA: Insufficient documentation

## 2017-12-04 DIAGNOSIS — M85852 Other specified disorders of bone density and structure, left thigh: Secondary | ICD-10-CM

## 2017-12-04 DIAGNOSIS — Z853 Personal history of malignant neoplasm of breast: Secondary | ICD-10-CM

## 2017-12-04 LAB — CBC WITH DIFFERENTIAL/PLATELET
Basophils Absolute: 0.1 10*3/uL (ref 0–0.1)
Basophils Relative: 1 %
Eosinophils Absolute: 0.2 10*3/uL (ref 0–0.7)
Eosinophils Relative: 3 %
HCT: 37.2 % (ref 35.0–47.0)
Hemoglobin: 12.1 g/dL (ref 12.0–16.0)
Lymphocytes Relative: 30 %
Lymphs Abs: 2.3 10*3/uL (ref 1.0–3.6)
MCH: 27.1 pg (ref 26.0–34.0)
MCHC: 32.5 g/dL (ref 32.0–36.0)
MCV: 83.3 fL (ref 80.0–100.0)
Monocytes Absolute: 0.6 10*3/uL (ref 0.2–0.9)
Monocytes Relative: 8 %
Neutro Abs: 4.6 10*3/uL (ref 1.4–6.5)
Neutrophils Relative %: 58 %
Platelets: 448 10*3/uL — ABNORMAL HIGH (ref 150–440)
RBC: 4.47 MIL/uL (ref 3.80–5.20)
RDW: 15.4 % — ABNORMAL HIGH (ref 11.5–14.5)
WBC: 7.7 10*3/uL (ref 3.6–11.0)

## 2017-12-04 LAB — COMPREHENSIVE METABOLIC PANEL
ALT: 17 U/L (ref 14–54)
AST: 19 U/L (ref 15–41)
Albumin: 4.3 g/dL (ref 3.5–5.0)
Alkaline Phosphatase: 64 U/L (ref 38–126)
Anion gap: 9 (ref 5–15)
BUN: 15 mg/dL (ref 6–20)
CO2: 26 mmol/L (ref 22–32)
Calcium: 9.7 mg/dL (ref 8.9–10.3)
Chloride: 101 mmol/L (ref 101–111)
Creatinine, Ser: 0.88 mg/dL (ref 0.44–1.00)
GFR calc Af Amer: >60 mL/min (ref >60)
GFR calc non Af Amer: >60 mL/min (ref >60)
Glucose, Bld: 110 mg/dL — ABNORMAL HIGH (ref 65–99)
Potassium: 3.4 mmol/L — ABNORMAL LOW (ref 3.5–5.1)
Sodium: 136 mmol/L (ref 135–145)
Total Bilirubin: 0.5 mg/dL (ref 0.3–1.2)
Total Protein: 7.8 g/dL (ref 6.5–8.1)

## 2017-12-04 MED ORDER — DENOSUMAB 60 MG/ML ~~LOC~~ SOLN
60.0000 mg | Freq: Once | SUBCUTANEOUS | Status: AC
  Administered 2017-12-04: 60 mg via SUBCUTANEOUS
  Filled 2017-12-04: qty 1

## 2017-12-04 NOTE — Progress Notes (Signed)
Here for follow up. Stated doing well  

## 2017-12-04 NOTE — Progress Notes (Signed)
[facility] day:  12/04/17  Chief Complaint: Tina Johnston is a 76 y.o. female with a history of bilateral breast cancer who is seen for 6 month assessment.  HPI: The patient was last seen in the medical oncology clinic on 06/12/2017.  At that time, she had a cramping feeling under her arms secondary to scarring.  Exam was stable.  CBC, CMP, and CA27.29 were normal.  She continued on Arimidex.  We discussed BCI testing.  At last visit, we discussed her osteopenia.  She was on calcium and vitamin D.  Prolia was preauthorized.  During the interim, patient is doing well overall. She denies physical complaints. Patient expresses no breast concerns. She has not experienced any B symptoms or interval infections. Weight remains stable.   She continues on Arimidex as prescribed with no perceived side effects.    Past Medical History:  Diagnosis Date  . Arthritis   . Breast cancer ([place]) 2014   Bilateral- radiation  . Breast cyst   . Hyperlipidemia   . Hypertension     Past Surgical History:  Procedure Laterality Date  . ABDOMINAL HYSTERECTOMY  1968  . BREAST BIOPSY Bilateral 2014   Bilateral Bx Both Positive c Bilateral Lumpectomy  . BREAST CYST ASPIRATION Right 2014   Positive  . BREAST LUMPECTOMY Bilateral 2014  . BREAST SURGERY Bilateral July 15 2013   wide excision w sentinel node biopsy  . SENTINEL LYMPH NODE BIOPSY Bilateral     Family History  Problem Relation Age of Onset  . Cancer Mother 38       breast  . Breast cancer Mother 6  . Ovarian cancer Paternal Grandmother     Social History:  reports that  has never smoked. she has never used smokeless tobacco. She reports that she does not drink alcohol or use drugs.  She lives in [place].  The patient is alone today.  Allergies:  Allergies  Allergen Reactions  . Ultram [Tramadol] Itching    Current Medications: Current Outpatient Medications  Medication Sig  Dispense Refill  . acetaminophen (TYLENOL) 500 MG tablet Take by mouth.    Marland Kitchen amLODipine (NORVASC) 5 MG tablet Take 1 tablet by mouth daily.    Marland Kitchen anastrozole (ARIMIDEX) 1 MG tablet TAKE 1 TABLET EVERY DAY 90 tablet 0  . Calcium Carb-Cholecalciferol 600-800 MG-UNIT TABS Take 1 tablet by mouth 2 (two) times daily.    . hydrochlorothiazide (MICROZIDE) 12.5 MG capsule Take 12.5 mg by mouth daily.    . Multiple Vitamins-Minerals (MULTIVITAMIN WITH MINERALS) tablet Take by mouth.    . potassium chloride (K-DUR) 10 MEQ tablet Take 1 tablet (10 mEq total) by mouth daily. 7 tablet 0  . simvastatin (ZOCOR) 20 MG tablet Take 1 tablet by mouth daily.    . fluticasone (FLONASE) 50 MCG/ACT nasal spray Place 1 spray into both nostrils daily.    . furosemide (LASIX) 20 MG tablet Reported on 05/28/2016    . meclizine (ANTIVERT) 25 MG tablet      No current facility-administered medications for this visit.     Review of Systems:  GENERAL:  Feels good.  No fevers or sweats.  Weight stable. PERFORMANCE STATUS (ECOG): 0 HEENT:  Edentulous. No visual changes, runny nose, sore throat, mouth sores or tenderness. Lungs: No shortness of breath or cough.  No hemoptysis. Cardiac:  No chest pain, palpitations, orthopnea, or PND. Breasts:  Feels like a cramp in RIGHT breast; improves with  movement. Tenderness and slight swelling to breasts (L>R) s/p radiation. GI:  No nausea, vomiting, diarrhea, constipation, melena or hematochezia. GU:  No urgency, frequency, dysuria, or hematuria. Musculoskeletal:  Arthritis.  No back pain.  No muscle tenderness. Extremities:  No pain or swelling. Skin:  No rashes or skin changes. Neuro:  No headache, numbness or weakness, balance or coordination issues. Endocrine:  No diabetes, thyroid issues, hot flashes or night sweats. Psych:  No mood changes, depression or anxiety.  Poor sleep. Pain:  No focal pain. Review of systems:  All other systems reviewed and found to be  negative.  Physical Exam: Blood pressure (!) 170/76, pulse 74, temperature (!) 96.5 F (35.8 C), temperature source Tympanic, resp. rate 18, weight 147 lb 14.4 oz (67.1 kg). GENERAL:  Well developed, well nourished, woman sitting comfortably in the exam room in no acute distress. MENTAL STATUS:  Alert and oriented to person, place and time. HEAD:  Curly gray hair.  Normocephalic, atraumatic, face symmetric, no Cushingoid features. EYES:  Glasses.  Brown eyes.  Pupils equal round and reactive to light and accomodation.  No conjunctivitis or scleral icterus. ENT:  Oropharynx clear without lesion.  Dentures.  Tongue normal. Mucous membranes moist.  RESPIRATORY:  Clear to auscultation without rales, wheezes or rhonchi. CARDIOVASCULAR:  Regular rate and rhythm without murmur, rub or gallop. BREAST:  Right breast with well healed incision at 9 o'clock position.  Mild post-operative and post-radiation changes laterally.  No masses, skin changes or nipple discharge.  Left breast with well healed superior areolar incision.  Marked post-operative and post-radiation changes.  Fibrocystic changes.  No masses, skin changes or nipple discharge. Left > right breast tenderness. ABDOMEN:  Soft, non-tender, with active bowel sounds, and no hepatosplenomegaly.  No masses. SKIN:  No rashes, ulcers or lesions. EXTREMITIES: No edema, no skin discoloration or tenderness.  No palpable cords. LYMPH NODES: No palpable cervical, supraclavicular, axillary or inguinal adenopathy  NEUROLOGICAL: Unremarkable. PSYCH:  Appropriate.   Appointment on 12/04/2017  Component Date Value Ref Range Status  . WBC 12/04/2017 7.7  3.6 - 11.0 K/uL Final  . RBC 12/04/2017 4.47  3.80 - 5.20 MIL/uL Final  . Hemoglobin 12/04/2017 12.1  12.0 - 16.0 g/dL Final  . HCT 12/04/2017 37.2  35.0 - 47.0 % Final  . MCV 12/04/2017 83.3  80.0 - 100.0 fL Final  . MCH 12/04/2017 27.1  26.0 - 34.0 pg Final  . MCHC 12/04/2017 32.5  32.0 - 36.0 g/dL  Final  . RDW 12/04/2017 15.4* 11.5 - 14.5 % Final  . Platelets 12/04/2017 448* 150 - 440 K/uL Final  . Neutrophils Relative % 12/04/2017 58  % Final  . Neutro Abs 12/04/2017 4.6  1.4 - 6.5 K/uL Final  . Lymphocytes Relative 12/04/2017 30  % Final  . Lymphs Abs 12/04/2017 2.3  1.0 - 3.6 K/uL Final  . Monocytes Relative 12/04/2017 8  % Final  . Monocytes Absolute 12/04/2017 0.6  0.2 - 0.9 K/uL Final  . Eosinophils Relative 12/04/2017 3  % Final  . Eosinophils Absolute 12/04/2017 0.2  0 - 0.7 K/uL Final  . Basophils Relative 12/04/2017 1  % Final  . Basophils Absolute 12/04/2017 0.1  0 - 0.1 K/uL Final  . Sodium 12/04/2017 136  135 - 145 mmol/L Final  . Potassium 12/04/2017 3.4* 3.5 - 5.1 mmol/L Final  . Chloride 12/04/2017 101  101 - 111 mmol/L Final  . CO2 12/04/2017 26  22 - 32 mmol/L Final  . Glucose,  Bld 12/04/2017 110* 65 - 99 mg/dL Final  . BUN 12/04/2017 15  6 - 20 mg/dL Final  . Creatinine, Ser 12/04/2017 0.88  0.44 - 1.00 mg/dL Final  . Calcium 12/04/2017 9.7  8.9 - 10.3 mg/dL Final  . Total Protein 12/04/2017 7.8  6.5 - 8.1 g/dL Final  . Albumin 12/04/2017 4.3  3.5 - 5.0 g/dL Final  . AST 12/04/2017 19  15 - 41 U/L Final  . ALT 12/04/2017 17  14 - 54 U/L Final  . Alkaline Phosphatase 12/04/2017 64  38 - 126 U/L Final  . Total Bilirubin 12/04/2017 0.5  0.3 - 1.2 mg/dL Final  . GFR calc non Af Amer 12/04/2017 >60  >60 mL/min Final  . GFR calc Af Amer 12/04/2017 >60  >60 mL/min Final   Comment: (NOTE) The eGFR has been calculated using the CKD EPI equation. This calculation has not been validated in all clinical situations. eGFR's persistently <60 mL/min signify possible Chronic Kidney Disease.   . Anion gap 12/04/2017 9  5 - 15 Final    Assessment:  Tina Johnston is a 76 y.o. female with bilateral breast cancer s/p bilateral lumpectomy and sentinel lymph node biopsy on 07/15/2013.  She received a bilateral breast radiation of 5040 cGy from 09/07/2013 until 10/14/2013.  She then went underwent left and right scar boost of 1600 cGy from 10/15/2013 until 10/26/2013.  She has been on Arimidex since 10/25/2013.   Right breast pathology revealed a 1.1 cm, grade I, invasive mammary carcinoma. Two sentinel lymph nodes were negative. Tumor was ER positive (greater than 90%), PR positive (1%), and HER-2/neu negative.  Oncotype DX testing on 08/24/2013 revealed a recurrence score of 15 (low risk) which corresponded to a 10 year risk of distant recurrence with tamoxifen alone of 9%.  Left breast pathology revealed 2 foci of 0.6 cm each. Three sentinel lymph nodes were negative.  Tumor was ER positive (90%), PR positive (80%), and HER-2/neu negative.   Bilateral mammogram on 05/27/2017 revealed no evidence of malignancy.    CA27.29 has been followed: 15.0 on 05/25/2014, 15.3 on 11/23/2014, 12.2 on 11/24/2015, 12.9 on 05/28/2016, 15.2 on 11/28/2016, 15 on 06/12/2017, and 16.9 on 12/04/2017.  Bone density on 01/14/2017 revealed osteopenia with a T-score of -1.7 in the left femoral neck.   Symptomatically, patient is doing well. She denies any concerns.  Exam is stable. Labs are stable.   Plan: 1.  Labs today:  CBC with diff, CMP, CA27.29. 2.  Schedule bilateral mammogram on 05/27/2018. 3.  Discuss osteopenia.  Continue Calcium 1200 mg and vitamin D 800 IU daily. 4.  Prolia injection today.  Potential side effects reviewed.  Patient consented to treatment. 5.  Continue Arimidex as previously prescribed.  6.  Discuss BCI testing to assess benefit of extended adjuvant hormonal therapy for 5 versus 10 years. Testing will be ordered in 04/2018. 7.  RTC in 6 months for MD assessment and labs (CBC with diff, CMP, CA27.29).   Honor Loh, NP  12/04/2017, 2:41 PM   I saw and evaluated the patient, participating in the key portions of the service and reviewing pertinent diagnostic studies and records.  I reviewed the nurse practitioner's note and agree with the findings and the  plan.  The assessment and plan were discussed with the patient.  Several questions were asked by the patient and answered.   Nolon Stalls, MD 12/04/2017,2:41 PM

## 2017-12-05 ENCOUNTER — Other Ambulatory Visit: Payer: Self-pay | Admitting: *Deleted

## 2017-12-05 DIAGNOSIS — C50419 Malignant neoplasm of upper-outer quadrant of unspecified female breast: Secondary | ICD-10-CM

## 2017-12-05 DIAGNOSIS — Z170 Estrogen receptor positive status [ER+]: Principal | ICD-10-CM

## 2017-12-05 LAB — CA 27.29 (SERIAL MONITOR): CA 27.29: 16.9 U/mL (ref 0.0–38.6)

## 2017-12-07 ENCOUNTER — Encounter: Payer: Self-pay | Admitting: Hematology and Oncology

## 2017-12-15 ENCOUNTER — Other Ambulatory Visit: Payer: Self-pay | Admitting: Hematology and Oncology

## 2017-12-25 DIAGNOSIS — Z0100 Encounter for examination of eyes and vision without abnormal findings: Secondary | ICD-10-CM | POA: Diagnosis not present

## 2017-12-29 DIAGNOSIS — E782 Mixed hyperlipidemia: Secondary | ICD-10-CM | POA: Diagnosis not present

## 2017-12-29 DIAGNOSIS — R7303 Prediabetes: Secondary | ICD-10-CM | POA: Diagnosis not present

## 2017-12-29 DIAGNOSIS — I10 Essential (primary) hypertension: Secondary | ICD-10-CM | POA: Diagnosis not present

## 2018-01-05 DIAGNOSIS — E782 Mixed hyperlipidemia: Secondary | ICD-10-CM | POA: Diagnosis not present

## 2018-01-05 DIAGNOSIS — R7303 Prediabetes: Secondary | ICD-10-CM | POA: Diagnosis not present

## 2018-01-05 DIAGNOSIS — C50411 Malignant neoplasm of upper-outer quadrant of right female breast: Secondary | ICD-10-CM | POA: Diagnosis not present

## 2018-01-05 DIAGNOSIS — I10 Essential (primary) hypertension: Secondary | ICD-10-CM | POA: Diagnosis not present

## 2018-01-05 DIAGNOSIS — M85852 Other specified disorders of bone density and structure, left thigh: Secondary | ICD-10-CM | POA: Diagnosis not present

## 2018-01-05 DIAGNOSIS — M150 Primary generalized (osteo)arthritis: Secondary | ICD-10-CM | POA: Diagnosis not present

## 2018-05-19 DIAGNOSIS — I10 Essential (primary) hypertension: Secondary | ICD-10-CM | POA: Diagnosis not present

## 2018-05-19 DIAGNOSIS — J069 Acute upper respiratory infection, unspecified: Secondary | ICD-10-CM | POA: Diagnosis not present

## 2018-05-28 ENCOUNTER — Encounter: Payer: Self-pay | Admitting: Radiation Oncology

## 2018-05-28 ENCOUNTER — Ambulatory Visit
Admission: RE | Admit: 2018-05-28 | Discharge: 2018-05-28 | Disposition: A | Discharge status: Home / Self Care | Payer: Medicare HMO | Source: Ambulatory Visit | Attending: Radiation Oncology | Admitting: Radiation Oncology

## 2018-05-28 ENCOUNTER — Other Ambulatory Visit: Payer: Self-pay

## 2018-05-28 VITALS — BP 173/78 | HR 77 | Temp 96.9°F | Resp 20 | Wt 148.4 lb

## 2018-05-28 DIAGNOSIS — D0511 Intraductal carcinoma in situ of right breast: Secondary | ICD-10-CM | POA: Diagnosis not present

## 2018-05-28 DIAGNOSIS — Z170 Estrogen receptor positive status [ER+]: Secondary | ICD-10-CM | POA: Diagnosis not present

## 2018-05-28 DIAGNOSIS — Z923 Personal history of irradiation: Secondary | ICD-10-CM | POA: Diagnosis not present

## 2018-05-28 DIAGNOSIS — D0512 Intraductal carcinoma in situ of left breast: Secondary | ICD-10-CM | POA: Insufficient documentation

## 2018-05-28 DIAGNOSIS — R1012 Left upper quadrant pain: Secondary | ICD-10-CM | POA: Diagnosis not present

## 2018-05-28 DIAGNOSIS — Z79811 Long term (current) use of aromatase inhibitors: Secondary | ICD-10-CM | POA: Diagnosis not present

## 2018-05-28 DIAGNOSIS — C50911 Malignant neoplasm of unspecified site of right female breast: Secondary | ICD-10-CM

## 2018-05-28 DIAGNOSIS — C50912 Malignant neoplasm of unspecified site of left female breast: Secondary | ICD-10-CM

## 2018-05-28 NOTE — Progress Notes (Signed)
Radiation Oncology Follow up Note  Name: Tina Johnston   Date:   05/28/2018 MRN:  [id number] DOB: 1941-10-04    This 77 y.o. female presents to the clinic today for close to 5 year follow-up status post bilateral breast radiation for DCIS .  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 She specifically denies breast tenderness cough or bone pain. She's been having some left upper quadrant pain associated with eating it may be reflux..her last mammogram which I have reviewed was back in June was BI-RADS 2 benign.she is currently on arimadex tolerating that well without side effect.  COMPLICATIONS OF TREATMENT: none  FOLLOW UP COMPLIANCE: keeps appointments   PHYSICAL EXAM:  BP (!) 173/78   Pulse 77   Temp (!) 96.9 F (36.1 C)   Resp 20   Wt 148 lb 5.9 oz (67.3 kg)   BMI 26.28 kg/m  Lungs are clear to A&P cardiac examination essentially unremarkable with regular rate and rhythm. No dominant mass or nodularity is noted in either breast in 2 positions examined. Incision is well-healed. No axillary or supraclavicular adenopathy is appreciated. Cosmetic result is excellent. Well-developed well-nourished patient in NAD. HEENT reveals PERLA, EOMI, discs not visualized.  Oral cavity is clear. No oral mucosal lesions are identified. Neck is clear without evidence of cervical or supraclavicular adenopathy. Lungs are clear to A&P. Cardiac examination is essentially unremarkable with regular rate and rhythm without murmur rub or thrill. Abdomen is benign with no organomegaly or masses noted. Motor sensory and DTR levels are equal and symmetric in the upper and lower extremities. Cranial nerves II through XII are grossly intact. Proprioception is intact. No peripheral adenopathy or  edema is identified. No motor or sensory levels are noted. Crude visual fields are within normal range.  RADIOLOGY RESULTS: mammograms reviewed compatible above-stated findings  PLAN: present time patient continues to do well now close to 5 years out with no evidence of disease. She continues on arimadex. She is a rescheduled follow-up mammograms. I am going to discontinue follow-up care since were close to 5 years out. I have suggested some Protonix should she have another flare of this what appears to be reflux. Her PMD is following this. Patient knows to call at anytime with any concerns.  I would like to take this opportunity to thank you for allowing me to participate in the care of your patient.Noreene Filbert, MD

## 2018-06-01 ENCOUNTER — Ambulatory Visit
Admission: RE | Admit: 2018-06-01 | Discharge: 2018-06-01 | Disposition: A | Discharge status: Home / Self Care | Payer: Medicare HMO | Source: Ambulatory Visit | Attending: Urgent Care | Admitting: Urgent Care

## 2018-06-01 DIAGNOSIS — R928 Other abnormal and inconclusive findings on diagnostic imaging of breast: Secondary | ICD-10-CM | POA: Diagnosis not present

## 2018-06-01 DIAGNOSIS — Z79811 Long term (current) use of aromatase inhibitors: Secondary | ICD-10-CM | POA: Diagnosis not present

## 2018-06-01 DIAGNOSIS — C50419 Malignant neoplasm of upper-outer quadrant of unspecified female breast: Secondary | ICD-10-CM | POA: Diagnosis present

## 2018-06-01 DIAGNOSIS — C50411 Malignant neoplasm of upper-outer quadrant of right female breast: Secondary | ICD-10-CM | POA: Diagnosis not present

## 2018-06-01 DIAGNOSIS — Z1231 Encounter for screening mammogram for malignant neoplasm of breast: Secondary | ICD-10-CM | POA: Insufficient documentation

## 2018-06-01 DIAGNOSIS — Z170 Estrogen receptor positive status [ER+]: Secondary | ICD-10-CM | POA: Diagnosis not present

## 2018-06-01 DIAGNOSIS — Z853 Personal history of malignant neoplasm of breast: Secondary | ICD-10-CM | POA: Insufficient documentation

## 2018-06-01 HISTORY — DX: Personal history of irradiation: Z92.3

## 2018-06-04 ENCOUNTER — Inpatient Hospital Stay: Payer: Medicare HMO

## 2018-06-04 ENCOUNTER — Inpatient Hospital Stay (HOSPITAL_BASED_OUTPATIENT_CLINIC_OR_DEPARTMENT_OTHER): Payer: Medicare HMO | Admitting: Hematology and Oncology

## 2018-06-04 ENCOUNTER — Inpatient Hospital Stay: Payer: Medicare HMO | Attending: Hematology and Oncology

## 2018-06-04 ENCOUNTER — Encounter: Payer: Self-pay | Admitting: Hematology and Oncology

## 2018-06-04 VITALS — BP 161/79 | HR 76 | Temp 98.5°F | Resp 18 | Wt 148.2 lb

## 2018-06-04 DIAGNOSIS — C50912 Malignant neoplasm of unspecified site of left female breast: Secondary | ICD-10-CM | POA: Insufficient documentation

## 2018-06-04 DIAGNOSIS — M8580 Other specified disorders of bone density and structure, unspecified site: Secondary | ICD-10-CM | POA: Diagnosis not present

## 2018-06-04 DIAGNOSIS — Z923 Personal history of irradiation: Secondary | ICD-10-CM | POA: Diagnosis not present

## 2018-06-04 DIAGNOSIS — C50419 Malignant neoplasm of upper-outer quadrant of unspecified female breast: Secondary | ICD-10-CM

## 2018-06-04 DIAGNOSIS — Z803 Family history of malignant neoplasm of breast: Secondary | ICD-10-CM | POA: Diagnosis not present

## 2018-06-04 DIAGNOSIS — C50911 Malignant neoplasm of unspecified site of right female breast: Secondary | ICD-10-CM

## 2018-06-04 DIAGNOSIS — Z8041 Family history of malignant neoplasm of ovary: Secondary | ICD-10-CM | POA: Diagnosis not present

## 2018-06-04 DIAGNOSIS — M85852 Other specified disorders of bone density and structure, left thigh: Secondary | ICD-10-CM

## 2018-06-04 DIAGNOSIS — Z170 Estrogen receptor positive status [ER+]: Secondary | ICD-10-CM

## 2018-06-04 DIAGNOSIS — I10 Essential (primary) hypertension: Secondary | ICD-10-CM | POA: Diagnosis not present

## 2018-06-04 DIAGNOSIS — Z79811 Long term (current) use of aromatase inhibitors: Secondary | ICD-10-CM

## 2018-06-04 DIAGNOSIS — D649 Anemia, unspecified: Secondary | ICD-10-CM

## 2018-06-04 LAB — COMPREHENSIVE METABOLIC PANEL
ALT: 18 U/L (ref 14–54)
AST: 22 U/L (ref 15–41)
Albumin: 4.2 g/dL (ref 3.5–5.0)
Alkaline Phosphatase: 62 U/L (ref 38–126)
Anion gap: 9 (ref 5–15)
BUN: 14 mg/dL (ref 6–20)
CO2: 27 mmol/L (ref 22–32)
Calcium: 9.7 mg/dL (ref 8.9–10.3)
Chloride: 103 mmol/L (ref 101–111)
Creatinine, Ser: 0.85 mg/dL (ref 0.44–1.00)
GFR calc Af Amer: >60 mL/min (ref >60)
GFR calc non Af Amer: >60 mL/min (ref >60)
Glucose, Bld: 98 mg/dL (ref 65–99)
Potassium: 3.4 mmol/L — ABNORMAL LOW (ref 3.5–5.1)
Sodium: 139 mmol/L (ref 135–145)
Total Bilirubin: 0.6 mg/dL (ref 0.3–1.2)
Total Protein: 7.9 g/dL (ref 6.5–8.1)

## 2018-06-04 LAB — CBC WITH DIFFERENTIAL/PLATELET
Basophils Absolute: 0.1 10*3/uL (ref 0–0.1)
Basophils Relative: 1 %
Eosinophils Absolute: 0.3 10*3/uL (ref 0–0.7)
Eosinophils Relative: 4 %
HCT: 37.9 % (ref 35.0–47.0)
Hemoglobin: 12.6 g/dL (ref 12.0–16.0)
Lymphocytes Relative: 27 %
Lymphs Abs: 2 10*3/uL (ref 1.0–3.6)
MCH: 27.6 pg (ref 26.0–34.0)
MCHC: 33.1 g/dL (ref 32.0–36.0)
MCV: 83.3 fL (ref 80.0–100.0)
Monocytes Absolute: 0.6 10*3/uL (ref 0.2–0.9)
Monocytes Relative: 8 %
Neutro Abs: 4.4 10*3/uL (ref 1.4–6.5)
Neutrophils Relative %: 60 %
Platelets: 453 10*3/uL — ABNORMAL HIGH (ref 150–440)
RBC: 4.55 MIL/uL (ref 3.80–5.20)
RDW: 15.3 % — ABNORMAL HIGH (ref 11.5–14.5)
WBC: 7.3 10*3/uL (ref 3.6–11.0)

## 2018-06-04 MED ORDER — DENOSUMAB 60 MG/ML ~~LOC~~ SOSY
60.0000 mg | PREFILLED_SYRINGE | Freq: Once | SUBCUTANEOUS | Status: AC
  Administered 2018-06-04: 60 mg via SUBCUTANEOUS
  Filled 2018-06-04: qty 1

## 2018-06-04 NOTE — Progress Notes (Signed)
[facility] day:  06/04/18  Chief Complaint: Tina Johnston is a 77 y.o. female with a history of bilateral breast cancer who is seen for 6 month assessment and Prolia.  HPI: The patient was last seen in the medical oncology clinic on 12/04/2017.  At that time, she was doing well.  She denied any concerns.  Exam was stable. Labs were stable. She continued Arimidex.  She received Prolia.  We discussed BCI testing.  Bilateral diagnostic mammogram on 06/01/2018 revealed no evidence of new or recurrent breast carcinoma.  There was benign postsurgical changes bilaterally.  During the interim, patient is doing well today, and does not express any acute concerns. Patient denies B symptoms.  Patient does not verbalize any concerns with regards to her breasts today. Patient  does perform monthly self breast examinations as recommended. Patient makes mention of an extended upper respiratory infection that lasted for "about a month". No hospitalization required.  Patient notes that she is eating well. Weight has up 1 pound. Patient denies pain in the clinic today.    Past Medical History:  Diagnosis Date  . Arthritis   . Breast cancer ([place]) 2014   Bilateral- radiation  . Breast cyst   . Hyperlipidemia   . Hypertension   . Personal history of radiation therapy 2014   Bilateral lumpectomy    Past Surgical History:  Procedure Laterality Date  . ABDOMINAL HYSTERECTOMY  1968  . BREAST BIOPSY Bilateral 2014   Bilateral Bx Both Positive c Bilateral Lumpectomy  . BREAST CYST ASPIRATION Right 2014   Positive  . BREAST LUMPECTOMY Bilateral 2014   f/u radiation   . BREAST SURGERY Bilateral July 15 2013   wide excision w sentinel node biopsy  . SENTINEL LYMPH NODE BIOPSY Bilateral     Family History  Problem Relation Age of Onset  . Cancer Mother 43       breast  . Breast cancer Mother 42  . Ovarian cancer Paternal Grandmother     Social  History:  reports that she has never smoked. She has never used smokeless tobacco. She reports that she does not drink alcohol or use drugs.  She lives in [place].  The patient is alone today.  Allergies:  Allergies  Allergen Reactions  . Ultram [Tramadol] Itching    Current Medications: Current Outpatient Medications  Medication Sig Dispense Refill  . acetaminophen (TYLENOL) 500 MG tablet Take by mouth.    Marland Kitchen amLODipine (NORVASC) 5 MG tablet Take 1 tablet by mouth daily.    Marland Kitchen anastrozole (ARIMIDEX) 1 MG tablet TAKE 1 TABLET EVERY DAY 90 tablet 0  . Calcium Carb-Cholecalciferol 600-800 MG-UNIT TABS Take 1 tablet by mouth 2 (two) times daily.    . fluticasone (FLONASE) 50 MCG/ACT nasal spray Place 1 spray into both nostrils daily.    . hydrochlorothiazide (MICROZIDE) 12.5 MG capsule Take 12.5 mg by mouth daily.    . Multiple Vitamins-Minerals (MULTIVITAMIN WITH MINERALS) tablet Take by mouth.    . potassium chloride (K-DUR) 10 MEQ tablet Take 1 tablet (10 mEq total) by mouth daily. (Patient taking differently: Take 10 mEq by mouth 2 (two) times daily. ) 7 tablet 0  . simvastatin (ZOCOR) 20 MG tablet Take 1 tablet by mouth daily.    . meclizine (ANTIVERT) 25 MG tablet      No current facility-administered medications for this visit.    Review of Systems  Constitutional: Negative for diaphoresis, fever, malaise/fatigue  and weight loss (weight up 1 pound).       Feels good.  HENT: Negative for nosebleeds and sore throat.   Eyes: Negative.   Respiratory: Negative for cough, hemoptysis, sputum production and shortness of breath.   Cardiovascular: Negative for chest pain, palpitations, orthopnea, leg swelling and PND.  Gastrointestinal: Negative for abdominal pain, blood in stool, constipation, diarrhea, melena, nausea and vomiting.  Genitourinary: Negative for dysuria, frequency, hematuria and urgency.  Musculoskeletal: Positive for joint pain. Negative for back pain, falls and myalgias.   Skin: Negative for itching and rash.       Breasts tender following mammogram.  Neurological: Negative for dizziness, tremors, weakness and headaches.  Endo/Heme/Allergies: Does not bruise/bleed easily.  Psychiatric/Behavioral: Negative for depression, memory loss and suicidal ideas. The patient is not nervous/anxious and does not have insomnia.   All other systems reviewed and are negative.  Performance status (ECOG): 0 - Asymptomatic  Physical Exam: Blood pressure (!) 161/79, pulse 76, temperature 98.5 F (36.9 C), temperature source Tympanic, resp. rate 18, weight 148 lb 3 oz (67.2 kg). GENERAL:  Well developed, well nourished, woman sitting comfortably in the exam room in no acute distress. MENTAL STATUS:  Alert and oriented to person, place and time. HEAD:  Dominica Severin curly hair.  Normocephalic, atraumatic, face symmetric, no Cushingoid features. EYES:  Glasses.  Brown eyes.  Pupils equal round and reactive to light and accomodation.  No conjunctivitis or scleral icterus. ENT:  Oropharynx clear without lesion.  Tongue normal. Dentures.  Mucous membranes moist.  RESPIRATORY:  Clear to auscultation without rales, wheezes or rhonchi. CARDIOVASCULAR:  Regular rate and rhythm without murmur, rub or gallop. BREAST:  Right breast with mild post-operative and post-radiation changes.  No discrete masses, skin changes or nipple discharge.  Left breast with marked post-operative and post-radiation changes.  No discrete masses, skin changes or nipple discharge. Bilateral breast tenderness. ABDOMEN:  Soft, non-tender, with active bowel sounds, and no hepatosplenomegaly.  No masses. SKIN:  No rashes, ulcers or lesions. EXTREMITIES: No edema, no skin discoloration or tenderness.  No palpable cords. LYMPH NODES: No palpable cervical, supraclavicular, axillary or inguinal adenopathy  NEUROLOGICAL: Unremarkable. PSYCH:  Appropriate.    Appointment on 06/04/2018  Component Date Value Ref Range Status  .  Sodium 06/04/2018 139  135 - 145 mmol/L Final  . Potassium 06/04/2018 3.4* 3.5 - 5.1 mmol/L Final  . Chloride 06/04/2018 103  101 - 111 mmol/L Final  . CO2 06/04/2018 27  22 - 32 mmol/L Final  . Glucose, Bld 06/04/2018 98  65 - 99 mg/dL Final  . BUN 06/04/2018 14  6 - 20 mg/dL Final  . Creatinine, Ser 06/04/2018 0.85  0.44 - 1.00 mg/dL Final  . Calcium 06/04/2018 9.7  8.9 - 10.3 mg/dL Final  . Total Protein 06/04/2018 7.9  6.5 - 8.1 g/dL Final  . Albumin 06/04/2018 4.2  3.5 - 5.0 g/dL Final  . AST 06/04/2018 22  15 - 41 U/L Final  . ALT 06/04/2018 18  14 - 54 U/L Final  . Alkaline Phosphatase 06/04/2018 62  38 - 126 U/L Final  . Total Bilirubin 06/04/2018 0.6  0.3 - 1.2 mg/dL Final  . GFR calc non Af Amer 06/04/2018 >60  >60 mL/min Final  . GFR calc Af Amer 06/04/2018 >60  >60 mL/min Final   Comment: (NOTE) The eGFR has been calculated using the CKD EPI equation. This calculation has not been validated in all clinical situations. eGFR's persistently <60 mL/min signify  possible Chronic Kidney Disease.   Tina Johnston gap 06/04/2018 9  5 - 15 Final   Performed at [facility], [address] [place], [place] [address]  . WBC 06/04/2018 7.3  3.6 - 11.0 K/uL Final  . RBC 06/04/2018 4.55  3.80 - 5.20 MIL/uL Final  . Hemoglobin 06/04/2018 12.6  12.0 - 16.0 g/dL Final  . HCT 06/04/2018 37.9  35.0 - 47.0 % Final  . MCV 06/04/2018 83.3  80.0 - 100.0 fL Final  . MCH 06/04/2018 27.6  26.0 - 34.0 pg Final  . MCHC 06/04/2018 33.1  32.0 - 36.0 g/dL Final  . RDW 06/04/2018 15.3* 11.5 - 14.5 % Final  . Platelets 06/04/2018 453* 150 - 440 K/uL Final  . Neutrophils Relative % 06/04/2018 60  % Final  . Neutro Abs 06/04/2018 4.4  1.4 - 6.5 K/uL Final  . Lymphocytes Relative 06/04/2018 27  % Final  . Lymphs Abs 06/04/2018 2.0  1.0 - 3.6 K/uL Final  . Monocytes Relative 06/04/2018 8  % Final  . Monocytes Absolute 06/04/2018 0.6  0.2 - 0.9 K/uL Final  . Eosinophils Relative 06/04/2018 4  % Final   . Eosinophils Absolute 06/04/2018 0.3  0 - 0.7 K/uL Final  . Basophils Relative 06/04/2018 1  % Final  . Basophils Absolute 06/04/2018 0.1  0 - 0.1 K/uL Final   Performed at [facility], [address] [place], [place] [address]    Assessment:  BELLA BRUMMET is a 77 y.o. female with bilateral breast cancer s/p bilateral lumpectomy and sentinel lymph node biopsy on 07/15/2013.  She received a bilateral breast radiation of 5040 cGy from 09/07/2013 until 10/14/2013. She then went underwent left and right scar boost of 1600 cGy from 10/15/2013 until 10/26/2013.  She has been on Arimidex since 10/25/2013.   Right breast pathology revealed a 1.1 cm, grade I, invasive mammary carcinoma. Two sentinel lymph nodes were negative. Tumor was ER positive (greater than 90%), PR positive (1%), and HER-2/neu negative.  Oncotype DX testing on 08/24/2013 revealed a recurrence score of 15 (low risk) which corresponded to a 10 year risk of distant recurrence with tamoxifen alone of 9%.  Left breast pathology revealed 2 foci of 0.6 cm each. Three sentinel lymph nodes were negative.  Tumor was ER positive (90%), PR positive (80%), and HER-2/neu negative.   Bilateral diagnostic mammogram on 06/01/2018 revealed no evidence of malignancy.    CA27.29 has been followed: 15.0 on 05/25/2014, 15.3 on 11/23/2014, 12.2 on 11/24/2015, 12.9 on 05/28/2016, 15.2 on 11/28/2016, 15 on 06/12/2017, and 16.9 on 12/04/2017.  Bone density on 01/14/2017 revealed osteopenia with a T-score of -1.7 in the left femoral neck.  She is on calcium and vitamin D.  She began Prolia on 12/04/2017.  Symptomatically, patient is doing well. She denies any concerns.  Exam is stable. Hemoglobin is 12.6.  MCV is 83.3.  Plan: 1. Labs today:  CBC with diff, CMP, CA27.29, ferritin, iron studies. 2. Review interval mammogram- no evidence of malignancy. 3. Discuss osteopenia.  Continue Calcium 1200 mg and vitamin D 800 IU daily.  4. Discuss  BCI (breast cancer index) testing on the patient's original tumor that is stored in the pathology lab. Testing will be used to assess the benefit of extended (5 versus 10 years) of adjuvant hormonal therapy. Patient provided with written information on the testing today, and was encouraged to contact their insurance company to inquire about coverage and out of pocket costs. Patient to  call office and advise [place] how she would like for [place] to proceed.  5. Prolia injection today. 6. Continue Arimidex as previously prescribed. 7. RTC in 6 months for MD assessment and labs (CBC with diff, CMP, CA27.29).   Honor Loh, NP  06/04/2018, 2:25 PM   I saw and evaluated the patient, participating in the key portions of the service and reviewing pertinent diagnostic studies and records.  I reviewed the nurse practitioner's note and agree with the findings and the plan.  The assessment and plan were discussed with the patient.  Several questions were asked by the patient and answered.   Nolon Stalls, MD 06/04/2018,2:25 PM

## 2018-06-04 NOTE — Progress Notes (Signed)
Pt in for 6 month follow up, denies any difficulties or concerns.  Verbalized Dr Donella Stade, radiation oncology had released her last week.

## 2018-06-05 ENCOUNTER — Telehealth: Payer: Self-pay | Admitting: *Deleted

## 2018-06-05 ENCOUNTER — Other Ambulatory Visit: Payer: Self-pay

## 2018-06-05 DIAGNOSIS — D649 Anemia, unspecified: Secondary | ICD-10-CM

## 2018-06-05 LAB — IRON AND TIBC
Iron: 45 ug/dL (ref 28–170)
Saturation Ratios: 14 % (ref 10.4–31.8)
TIBC: 319 ug/dL (ref 250–450)
UIBC: 274 ug/dL

## 2018-06-05 LAB — FERRITIN: FERRITIN: 38 ng/mL (ref 11–307)

## 2018-06-05 LAB — CA 27.29 (SERIAL MONITOR): CA 27.29: 18.3 U/mL (ref 0.0–38.6)

## 2018-06-05 NOTE — Telephone Encounter (Signed)
Patient called to report that her insurance company is not understanding the request for BCI testing and wants more information than she can give. [place] with [facility] [phone].

## 2018-06-05 NOTE — Telephone Encounter (Signed)
I contacted patient and notified her of this information. Patient states she appreciats all the time that ws given to find out what was needed, but she in unable to come up with a $3400 OOP expense at this time. Patient states she would like to know if there are any other options or recommendations from [facility] or Dr. Loletha Grayer?

## 2018-06-05 NOTE — Telephone Encounter (Signed)
After a great deal of time on the phone with [facility], I was informed of the following.  Patient has a $3400 annual OOP, of which none has been satisfied as of today. Therefore, to have the BCI testing done, it will cost her $3400. Future testing and medical expenses will be covered at 100% for this year once her OOP has been satisfied.   How would she like to proceed? Does she wish to have the BCI (breast cancer index) testing done on her original tumor that is stored in the pathology lab to assess the benefit of extended (5 versus 10 years) of adjuvant hormonal therapy?  Honor Loh, MSN, APRN, FNP-C, CEN Oncology/Hematology Nurse Practitioner  [facility] 06/05/18 4:24 PM

## 2018-06-05 NOTE — Telephone Encounter (Signed)
In the absence of testing, we are unsure if she would benefit from extended adjuvant hormonal therapy. She has to make the decision whether or not she would like to continue daily hormonal therapy for another 5 years (10 years total), or if she would like to discontinue at the 5 year mark.  Gaspar Bidding

## 2018-06-08 NOTE — Telephone Encounter (Signed)
I contacted patient with Gaspar Bidding, NP's recommendations. Patient then states 'I donate money to your cancer center every month to help people, but I guess it doesn't help people at all because I can't afford this test and no one will help me." I reassured patient I would notify Dr. Mike Gip & Gaspar Bidding of this issue once more, but even without the testing, it was her decision as to if she wanted to continue adjuvant hormonal therapy for another 5 years, or if she would like to discontinue at the 5 year mark. Patient then continued to tell me more how she just could not afford the BCI test. I asked patient if she would like to this about her decision and she could express any questions or concerns at her next visit & patient states "all I have done is think about this, and I don't want to think about this anymore." I advised patient I would be in contact with [facility] & Dr. Mike Gip to see if there is anything further that we can do to assist patient - but otherwise, we could see and discuss this with her at her next appt in December.

## 2018-06-29 DIAGNOSIS — R7303 Prediabetes: Secondary | ICD-10-CM | POA: Diagnosis not present

## 2018-06-29 DIAGNOSIS — M150 Primary generalized (osteo)arthritis: Secondary | ICD-10-CM | POA: Diagnosis not present

## 2018-06-29 DIAGNOSIS — I10 Essential (primary) hypertension: Secondary | ICD-10-CM | POA: Diagnosis not present

## 2018-06-29 DIAGNOSIS — M85852 Other specified disorders of bone density and structure, left thigh: Secondary | ICD-10-CM | POA: Diagnosis not present

## 2018-06-29 DIAGNOSIS — E782 Mixed hyperlipidemia: Secondary | ICD-10-CM | POA: Diagnosis not present

## 2018-07-06 DIAGNOSIS — M150 Primary generalized (osteo)arthritis: Secondary | ICD-10-CM | POA: Diagnosis not present

## 2018-07-06 DIAGNOSIS — C50912 Malignant neoplasm of unspecified site of left female breast: Secondary | ICD-10-CM | POA: Diagnosis not present

## 2018-07-06 DIAGNOSIS — E782 Mixed hyperlipidemia: Secondary | ICD-10-CM | POA: Diagnosis not present

## 2018-07-06 DIAGNOSIS — M85852 Other specified disorders of bone density and structure, left thigh: Secondary | ICD-10-CM | POA: Diagnosis not present

## 2018-07-06 DIAGNOSIS — I10 Essential (primary) hypertension: Secondary | ICD-10-CM | POA: Diagnosis not present

## 2018-07-06 DIAGNOSIS — Z170 Estrogen receptor positive status [ER+]: Secondary | ICD-10-CM | POA: Diagnosis not present

## 2018-07-06 DIAGNOSIS — C50911 Malignant neoplasm of unspecified site of right female breast: Secondary | ICD-10-CM | POA: Diagnosis not present

## 2018-07-06 DIAGNOSIS — R7303 Prediabetes: Secondary | ICD-10-CM | POA: Diagnosis not present

## 2018-07-06 DIAGNOSIS — Z0000 Encounter for general adult medical examination without abnormal findings: Secondary | ICD-10-CM | POA: Diagnosis not present

## 2018-07-08 ENCOUNTER — Inpatient Hospital Stay: Payer: Medicare HMO | Attending: Hematology and Oncology

## 2018-07-08 ENCOUNTER — Telehealth: Payer: Self-pay | Admitting: *Deleted

## 2018-07-08 DIAGNOSIS — D649 Anemia, unspecified: Secondary | ICD-10-CM

## 2018-07-08 DIAGNOSIS — C50911 Malignant neoplasm of unspecified site of right female breast: Secondary | ICD-10-CM | POA: Diagnosis not present

## 2018-07-08 LAB — IRON AND TIBC
IRON: 72 ug/dL (ref 28–170)
Saturation Ratios: 23 % (ref 10.4–31.8)
TIBC: 309 ug/dL (ref 250–450)
UIBC: 237 ug/dL

## 2018-07-08 LAB — SEDIMENTATION RATE: SED RATE: 29 mm/h (ref 0–30)

## 2018-07-08 LAB — FERRITIN: FERRITIN: 28 ng/mL (ref 11–307)

## 2018-07-08 NOTE — Telephone Encounter (Signed)
Patient called the clinic and spoke to me today regarding BCI testing.  She states she cannot afford to have BCI testing done.  She would, however, like to know if she does not have the test is she still okay to continue taking anastrozole? I informed the patient that Dr. Mike Gip is out of the office until next week and upon her return I will ask her and give the patient a call back.  Patient verbalized understanding.

## 2018-07-08 NOTE — Telephone Encounter (Signed)
Called patient to inform her that her ferritin is a little low.  Gaspar Bidding, NP recommends patient start on oral iron to be taken with OJ or oral Vitamin C.  Patient states she can drink OJ okay so she will pick up her iron and take it with juice.

## 2018-07-08 NOTE — Telephone Encounter (Signed)
-----   Message from Karen Kitchens, NP sent at 07/08/2018  1:34 PM EDT ----- Ferritin low. Have her start oral iron daily with a source of vitamin C. Also, make sure she is down for Prolia at her next RTC visit.   Gaspar Bidding  ----- Message ----- From: Buel Ream, Lab In [place] Sent: 07/08/2018  12:14 PM To: Karen Kitchens, NP

## 2018-07-08 NOTE — Telephone Encounter (Signed)
Attempted to call patient.  No answer.  Will try again later. 

## 2018-07-08 NOTE — Telephone Encounter (Signed)
-----   Message from Karen Kitchens, NP sent at 07/08/2018  1:34 PM EDT ----- Ferritin low. Have her start oral iron daily with a source of vitamin C. Also, make sure she is down for Prolia at her next RTC visit.   Gaspar Bidding  ----- Message ----- From: Buel Ream, Lab In [place] Sent: 07/08/2018  12:14 PM To: Karen Kitchens, NP

## 2018-09-14 DIAGNOSIS — M21072 Valgus deformity, not elsewhere classified, left ankle: Secondary | ICD-10-CM | POA: Diagnosis not present

## 2018-09-14 DIAGNOSIS — M216X1 Other acquired deformities of right foot: Secondary | ICD-10-CM | POA: Diagnosis not present

## 2018-09-14 DIAGNOSIS — M216X2 Other acquired deformities of left foot: Secondary | ICD-10-CM | POA: Diagnosis not present

## 2018-09-14 DIAGNOSIS — M21071 Valgus deformity, not elsewhere classified, right ankle: Secondary | ICD-10-CM | POA: Diagnosis not present

## 2018-09-28 DIAGNOSIS — R399 Unspecified symptoms and signs involving the genitourinary system: Secondary | ICD-10-CM | POA: Diagnosis not present

## 2018-10-19 ENCOUNTER — Other Ambulatory Visit: Payer: Self-pay | Admitting: Hematology and Oncology

## 2018-10-19 NOTE — Telephone Encounter (Signed)
Spoke with Loreen from [employer] who advised that patient should have no OOP cost, as her current [employer] (Medicare advantage) has part D coverage embedded. Medicare part D covers this testing. She may have a very small co-pay, but they are unable to determine how much.   She is welcome to contact Loreen at [phone] to discuss. Patient does NOT have to apply for PFA upfront. IF she receives a bill from [employer], it automatically comes with a PFA application. Loreen willing to assist patient with completion due to her age.  Please let her know this, provide her with Loreen's number, and ask her how she wishes to proceed.   Honor Loh, MSN, APRN, FNP-C, CEN Oncology/Hematology Nurse Practitioner  [facility] 10/19/18, 4:51 PM

## 2018-10-19 NOTE — Telephone Encounter (Signed)
I have placed a call to [facility] to see if we could potentially obtain PFA for this patient to have BCI testing performed. I would like to prevent her from having to remain on therapy if there is little to no benefit.   She has been on Arimidex since 10/2013. Within a few days, she will have completed 5 years of adjuvant hormonal therapy for her BILATERAL breast cancer.  Honor Loh, MSN, APRN, FNP-C, CEN Oncology/Hematology Nurse Practitioner  [facility] 10/19/18, 3:32 PM

## 2018-10-20 ENCOUNTER — Telehealth: Payer: Self-pay | Admitting: *Deleted

## 2018-10-20 NOTE — Telephone Encounter (Signed)
Called patient to inform her that Honor Loh, NP has spoken to [facility] with [facility] regarding BCI testing.  Patient called yesterday to ask for refill on Anastrazole.  She is very close (early Nov) to her 5 year mark of taking this medication.  Patient had inquired about BCI testing but did not pursue because of finances.  According to [employer] should pay for the test and the patient have only a very small copay.  I gave patient Loreen's number [phone]) [phone].  She is going to call her and will call us back with her decision whether or not to proceed with testing.  She states she has enough medication for 2 weeks.

## 2018-10-22 ENCOUNTER — Telehealth: Payer: Self-pay | Admitting: *Deleted

## 2018-10-22 NOTE — Telephone Encounter (Signed)
BCI testing requisition completed and faxed to [facility] today.  Honor Loh, MSN, APRN, FNP-C, CEN Oncology/Hematology Nurse Practitioner  [facility] 10/22/18, 5:24 PM

## 2018-10-22 NOTE — Telephone Encounter (Signed)
Patient called to say that she spoke with Loreen @ [facility] and she can get her BCI testing @ no charge to her.  She would like to proceed and asked that we go ahead and send the specimen.

## 2018-11-05 DIAGNOSIS — I7300 Raynaud's syndrome without gangrene: Secondary | ICD-10-CM | POA: Diagnosis not present

## 2018-11-05 DIAGNOSIS — M79672 Pain in left foot: Secondary | ICD-10-CM | POA: Diagnosis not present

## 2018-11-05 DIAGNOSIS — M79671 Pain in right foot: Secondary | ICD-10-CM | POA: Diagnosis not present

## 2018-11-25 DIAGNOSIS — Z170 Estrogen receptor positive status [ER+]: Secondary | ICD-10-CM | POA: Diagnosis not present

## 2018-11-25 DIAGNOSIS — C50412 Malignant neoplasm of upper-outer quadrant of left female breast: Secondary | ICD-10-CM | POA: Diagnosis not present

## 2018-11-26 ENCOUNTER — Encounter: Payer: Self-pay | Admitting: Hematology and Oncology

## 2018-12-04 ENCOUNTER — Inpatient Hospital Stay: Payer: Medicare HMO | Attending: Hematology and Oncology

## 2018-12-04 ENCOUNTER — Inpatient Hospital Stay: Payer: Medicare HMO

## 2018-12-04 ENCOUNTER — Encounter: Payer: Self-pay | Admitting: Hematology and Oncology

## 2018-12-04 ENCOUNTER — Inpatient Hospital Stay (HOSPITAL_BASED_OUTPATIENT_CLINIC_OR_DEPARTMENT_OTHER): Payer: Medicare HMO | Admitting: Hematology and Oncology

## 2018-12-04 VITALS — BP 159/81 | HR 76 | Temp 98.5°F | Resp 18 | Wt 146.4 lb

## 2018-12-04 DIAGNOSIS — Z79811 Long term (current) use of aromatase inhibitors: Secondary | ICD-10-CM | POA: Diagnosis not present

## 2018-12-04 DIAGNOSIS — M85852 Other specified disorders of bone density and structure, left thigh: Secondary | ICD-10-CM

## 2018-12-04 DIAGNOSIS — C50419 Malignant neoplasm of upper-outer quadrant of unspecified female breast: Secondary | ICD-10-CM

## 2018-12-04 DIAGNOSIS — C50911 Malignant neoplasm of unspecified site of right female breast: Secondary | ICD-10-CM

## 2018-12-04 DIAGNOSIS — M8580 Other specified disorders of bone density and structure, unspecified site: Secondary | ICD-10-CM

## 2018-12-04 DIAGNOSIS — C50912 Malignant neoplasm of unspecified site of left female breast: Secondary | ICD-10-CM | POA: Insufficient documentation

## 2018-12-04 DIAGNOSIS — E876 Hypokalemia: Secondary | ICD-10-CM

## 2018-12-04 DIAGNOSIS — Z170 Estrogen receptor positive status [ER+]: Secondary | ICD-10-CM | POA: Diagnosis not present

## 2018-12-04 DIAGNOSIS — Z7189 Other specified counseling: Secondary | ICD-10-CM

## 2018-12-04 LAB — CBC WITH DIFFERENTIAL/PLATELET
Abs Immature Granulocytes: 0.03 10*3/uL (ref 0.00–0.07)
BASOS PCT: 0 %
Basophils Absolute: 0 10*3/uL (ref 0.0–0.1)
Eosinophils Absolute: 0.2 10*3/uL (ref 0.0–0.5)
Eosinophils Relative: 3 %
HCT: 39.9 % (ref 36.0–46.0)
HEMOGLOBIN: 12.6 g/dL (ref 12.0–15.0)
IMMATURE GRANULOCYTES: 0 %
LYMPHS PCT: 32 %
Lymphs Abs: 2.6 10*3/uL (ref 0.7–4.0)
MCH: 27.4 pg (ref 26.0–34.0)
MCHC: 31.6 g/dL (ref 30.0–36.0)
MCV: 86.7 fL (ref 80.0–100.0)
Monocytes Absolute: 0.6 10*3/uL (ref 0.1–1.0)
Monocytes Relative: 7 %
NEUTROS PCT: 58 %
Neutro Abs: 4.9 10*3/uL (ref 1.7–7.7)
PLATELETS: 343 10*3/uL (ref 150–400)
RBC: 4.6 MIL/uL (ref 3.87–5.11)
RDW: 15.3 % (ref 11.5–15.5)
WBC: 8.4 10*3/uL (ref 4.0–10.5)
nRBC: 0 % (ref 0.0–0.2)

## 2018-12-04 LAB — COMPREHENSIVE METABOLIC PANEL
ALBUMIN: 4.1 g/dL (ref 3.5–5.0)
ALT: 19 U/L (ref 0–44)
ANION GAP: 8 (ref 5–15)
AST: 23 U/L (ref 15–41)
Alkaline Phosphatase: 53 U/L (ref 38–126)
BILIRUBIN TOTAL: 0.6 mg/dL (ref 0.3–1.2)
BUN: 12 mg/dL (ref 8–23)
CHLORIDE: 103 mmol/L (ref 98–111)
CO2: 29 mmol/L (ref 22–32)
Calcium: 9.2 mg/dL (ref 8.9–10.3)
Creatinine, Ser: 0.83 mg/dL (ref 0.44–1.00)
GFR calc Af Amer: >60 mL/min (ref >60)
GLUCOSE: 100 mg/dL — AB (ref 70–99)
POTASSIUM: 3.2 mmol/L — AB (ref 3.5–5.1)
Sodium: 140 mmol/L (ref 135–145)
TOTAL PROTEIN: 7.6 g/dL (ref 6.5–8.1)

## 2018-12-04 MED ORDER — DENOSUMAB 60 MG/ML ~~LOC~~ SOSY
60.0000 mg | PREFILLED_SYRINGE | Freq: Once | SUBCUTANEOUS | Status: AC
  Administered 2018-12-04: 60 mg via SUBCUTANEOUS
  Filled 2018-12-04: qty 1

## 2018-12-04 NOTE — Progress Notes (Signed)
Pt denies any difficulties or concerns.  Reports had started taking iron and vitamin c.

## 2018-12-04 NOTE — Progress Notes (Signed)
[facility] day:  12/04/18  Chief Complaint: Tina Johnston is a 77 y.o. female with a history of bilateral breast cancer who is seen for 6 month assessment and Prolia.  HPI: The patient was last seen in the medical oncology clinic on 06/04/2018.  At that time, she was doing well. She denied any concerns.  Exam was stable. Hemoglobin was 12.6.  MCV was 83.3.  She continued Arimidex.  She received Prolia.  BCI testing on 11/13/2018 revealed a 3.7% (95% CI:  1.4% - 5.9%) risk of late recurrence between years 5-10 and a low likelihood of benefit.  During the interim, she has felt good.  She denies any problems.  She is taking 1 iron pill/day.  Diet is good.  She denies any bleeding.  She denies any breast concerns.   Past Medical History:  Diagnosis Date  . Arthritis   . Breast cancer ([place]) 2014   Bilateral- radiation  . Breast cyst   . Hyperlipidemia   . Hypertension   . Personal history of radiation therapy 2014   Bilateral lumpectomy    Past Surgical History:  Procedure Laterality Date  . ABDOMINAL HYSTERECTOMY  1968  . BREAST BIOPSY Bilateral 2014   Bilateral Bx Both Positive c Bilateral Lumpectomy  . BREAST CYST ASPIRATION Right 2014   Positive  . BREAST LUMPECTOMY Bilateral 2014   f/u radiation   . BREAST SURGERY Bilateral July 15 2013   wide excision w sentinel node biopsy  . SENTINEL LYMPH NODE BIOPSY Bilateral     Family History  Problem Relation Age of Onset  . Cancer Mother 57       breast  . Breast cancer Mother 58  . Ovarian cancer Paternal Grandmother     Social History:  reports that she has never smoked. She has never used smokeless tobacco. She reports that she does not drink alcohol or use drugs.  She lives in [place].  The patient is alone today.  Allergies:  Allergies  Allergen Reactions  . Ultram [Tramadol] Itching    Current Medications: Current Outpatient Medications  Medication Sig  Dispense Refill  . acetaminophen (TYLENOL) 500 MG tablet Take by mouth.    Marland Kitchen amLODipine (NORVASC) 5 MG tablet Take 1 tablet by mouth daily.    . Ascorbic Acid (VITAMIN C) 100 MG tablet Take 100 mg by mouth daily.    . Calcium Carb-Cholecalciferol 600-800 MG-UNIT TABS Take 1 tablet by mouth 2 (two) times daily.    . ferrous sulfate 325 (65 FE) MG EC tablet Take 325 mg by mouth 3 (three) times daily with meals.    . fluticasone (FLONASE) 50 MCG/ACT nasal spray Place 1 spray into both nostrils daily.    Marland Kitchen gabapentin (NEURONTIN) 100 MG capsule Take by mouth.    . hydrochlorothiazide (MICROZIDE) 12.5 MG capsule Take 12.5 mg by mouth daily.    . Multiple Vitamins-Minerals (MULTIVITAMIN WITH MINERALS) tablet Take by mouth.    . potassium chloride (K-DUR) 10 MEQ tablet Take 1 tablet (10 mEq total) by mouth daily. (Patient taking differently: Take 10 mEq by mouth 2 (two) times daily. ) 7 tablet 0  . simvastatin (ZOCOR) 20 MG tablet Take 1 tablet by mouth daily.    Marland Kitchen anastrozole (ARIMIDEX) 1 MG tablet TAKE 1 TABLET EVERY DAY (Patient not taking: Reported on 12/04/2018) 90 tablet 0  . meclizine (ANTIVERT) 25 MG tablet Take 1 tablet (25 mg total) by mouth 3 (three)  times daily as needed for dizziness. 20 tablet 0   No current facility-administered medications for this visit.     Review of Systems  Constitutional: Negative for chills, diaphoresis, fever, malaise/fatigue and weight loss (2 pounds).       Feels "good".  HENT: Negative.  Negative for congestion, ear discharge, ear pain, nosebleeds, sinus pain and sore throat.   Eyes: Negative.  Negative for blurred vision, double vision, photophobia and pain.  Respiratory: Negative.  Negative for cough, hemoptysis, sputum production and shortness of breath.   Cardiovascular: Negative.  Negative for chest pain, palpitations, orthopnea, leg swelling and PND.  Gastrointestinal: Negative.  Negative for blood in stool, constipation, diarrhea, melena, nausea and  vomiting.  Genitourinary: Negative.  Negative for dysuria, frequency, hematuria and urgency.  Musculoskeletal: Negative for back pain, falls, joint pain, myalgias and neck pain.  Skin: Negative.  Negative for itching and rash.  Neurological: Negative.  Negative for dizziness, tremors, sensory change, speech change, focal weakness, weakness and headaches.  Endo/Heme/Allergies: Negative.  Does not bruise/bleed easily.  Psychiatric/Behavioral: Negative.  Negative for depression and memory loss. The patient is not nervous/anxious and does not have insomnia.   All other systems reviewed and are negative.  Performance status (ECOG): 0  Physical Exam: Blood pressure (!) 159/81, pulse 76, temperature 98.5 F (36.9 C), temperature source Tympanic, resp. rate 18, weight 146 lb 7 oz (66.4 kg), SpO2 97 %. GENERAL:  Well developed, well nourished, woman sitting comfortably in the exam room in no acute distress.  She appears younger than stated age. MENTAL STATUS:  Alert and oriented to person, place and time. HEAD:  Pearline Cables hair.  Normocephalic, atraumatic, face symmetric, no Cushingoid features. EYES:  Brown eyes.  Pupils equal round and reactive to light and accomodation.  No conjunctivitis or scleral icterus. ENT:  Oropharynx clear without lesion.  Tongue normal.  Dentures.  Mucous membranes moist.  RESPIRATORY:  Clear to auscultation without rales, wheezes or rhonchi. CARDIOVASCULAR:  Regular rate and rhythm without murmur, rub or gallop. BREAST:  Right breast with post-operative and post radiation changes.  No discrete masses, skin changes or nipple discharge.  Left breast with stable post-operative and post radiation changes.  No masses, skin changes or nipple discharge. ABDOMEN:  Soft, non-tender, with active bowel sounds, and no hepatosplenomegaly.  No masses. SKIN:  No rashes, ulcers or lesions. EXTREMITIES: No edema, no skin discoloration or tenderness.  No palpable cords. LYMPH NODES: No palpable  cervical, supraclavicular, axillary or inguinal adenopathy  NEUROLOGICAL: Unremarkable. PSYCH:  Appropriate.    Appointment on 12/04/2018  Component Date Value Ref Range Status  . CA 27.29 12/04/2018 16.2  0.0 - 38.6 U/mL Final   Comment: (NOTE) Siemens Centaur Immunochemiluminometric Methodology [facility]) Values obtained with different assay methods or kits cannot be used interchangeably. Results cannot be interpreted as absolute evidence of the presence or absence of malignant disease. Performed At: [facility] [place], [place] [id number] Rush Farmer MD NG:[id number]   . Sodium 12/04/2018 140  135 - 145 mmol/L Final  . Potassium 12/04/2018 3.2* 3.5 - 5.1 mmol/L Final  . Chloride 12/04/2018 103  98 - 111 mmol/L Final  . CO2 12/04/2018 29  22 - 32 mmol/L Final  . Glucose, Bld 12/04/2018 100* 70 - 99 mg/dL Final  . BUN 12/04/2018 12  8 - 23 mg/dL Final  . Creatinine, Ser 12/04/2018 0.83  0.44 - 1.00 mg/dL Final  . Calcium 12/04/2018 9.2  8.9 - 10.3 mg/dL Final  .  Total Protein 12/04/2018 7.6  6.5 - 8.1 g/dL Final  . Albumin 12/04/2018 4.1  3.5 - 5.0 g/dL Final  . AST 12/04/2018 23  15 - 41 U/L Final  . ALT 12/04/2018 19  0 - 44 U/L Final  . Alkaline Phosphatase 12/04/2018 53  38 - 126 U/L Final  . Total Bilirubin 12/04/2018 0.6  0.3 - 1.2 mg/dL Final  . GFR calc non Af Amer 12/04/2018 >60  >60 mL/min Final  . GFR calc Af Amer 12/04/2018 >60  >60 mL/min Final  . Anion gap 12/04/2018 8  5 - 15 Final   Performed at [facility], [address] [place], [place] [address]  . WBC 12/04/2018 8.4  4.0 - 10.5 K/uL Final  . RBC 12/04/2018 4.60  3.87 - 5.11 MIL/uL Final  . Hemoglobin 12/04/2018 12.6  12.0 - 15.0 g/dL Final  . HCT 12/04/2018 39.9  36.0 - 46.0 % Final  . MCV 12/04/2018 86.7  80.0 - 100.0 fL Final  . MCH 12/04/2018 27.4  26.0 - 34.0 pg Final  . MCHC 12/04/2018 31.6  30.0 - 36.0 g/dL Final  . RDW 12/04/2018 15.3  11.5 - 15.5 % Final  .  Platelets 12/04/2018 343  150 - 400 K/uL Final  . nRBC 12/04/2018 0.0  0.0 - 0.2 % Final  . Neutrophils Relative % 12/04/2018 58  % Final  . Neutro Abs 12/04/2018 4.9  1.7 - 7.7 K/uL Final  . Lymphocytes Relative 12/04/2018 32  % Final  . Lymphs Abs 12/04/2018 2.6  0.7 - 4.0 K/uL Final  . Monocytes Relative 12/04/2018 7  % Final  . Monocytes Absolute 12/04/2018 0.6  0.1 - 1.0 K/uL Final  . Eosinophils Relative 12/04/2018 3  % Final  . Eosinophils Absolute 12/04/2018 0.2  0.0 - 0.5 K/uL Final  . Basophils Relative 12/04/2018 0  % Final  . Basophils Absolute 12/04/2018 0.0  0.0 - 0.1 K/uL Final  . Immature Granulocytes 12/04/2018 0  % Final  . Abs Immature Granulocytes 12/04/2018 0.03  0.00 - 0.07 K/uL Final   Performed at [facility], [address] [place], [place] [id number]    Assessment:  DOSIA YODICE is a 77 y.o. female with bilateral breast cancer s/p bilateral lumpectomy and sentinel lymph node biopsy on 07/15/2013.  She received a bilateral breast radiation of 5040 cGy from 09/07/2013 until 10/14/2013. She then went underwent left and right scar boost of 1600 cGy from 10/15/2013 until 10/26/2013.  She has been on Arimidex since 10/25/2013.   Right breast pathology revealed a 1.1 cm, grade I, invasive mammary carcinoma. Two sentinel lymph nodes were negative. Tumor was ER positive (greater than 90%), PR positive (1%), and HER-2/neu negative.  Oncotype DX testing on 08/24/2013 revealed a recurrence score of 15 (low risk) which corresponded to a 10 year risk of distant recurrence with tamoxifen alone of 9%.  Left breast pathology revealed 2 foci of 0.6 cm each. Three sentinel lymph nodes were negative.  Tumor was ER positive (90%), PR positive (80%), and HER-2/neu negative.   BCI testing on 11/13/2018 revealed a 3.7% (95% CI:  1.4% - 5.9%) risk of late recurrence between years 5-10 and a low likelihood of benefit.  Bilateral diagnostic mammogram on 06/01/2018 revealed no  evidence of malignancy.    CA27.29 has been followed: 15.0 on 05/25/2014, 15.3 on 11/23/2014, 12.2 on 11/24/2015, 12.9 on 05/28/2016, 15.2 on 11/28/2016, 15 on 06/12/2017, 16.9 on 12/04/2017, 18.3 on 06/04/2018, and 16.2 on  12/04/2018.  Bone density on 01/14/2017 revealed osteopenia with a T-score of -1.7 in the left femoral neck.  She is on calcium and vitamin D.  She began Prolia on 12/04/2017 (last 06/04/2018).  Symptomatically, she is doing well.  Exam is stable.  Hemoglobin is 12.6.  MCV is 86.7.  Plan: 1. Labs today:  CBC with diff, CMP, CA27.29. 2. Bilateral breast cancer  Clinically doing well s/p 5 years of endocrine therapy.  Review BCI testing.  Low risk of recurrence between years 5-10 and low likelihood of benefit for extended adjuvant endocrine therapy.  Discuss discontinuation of Armidex.  Schedule bilateral mammogram 06/02/2019. 3. Osteopenia  Continue calcium 1200 mg and vitamin D 800 IU daily.   Next bone density due 01/14/2019.  Prolia today. 4.  Hypokalemia  Potassium 3.2.  Patient taking potassium chloride 10 meq BID at baseline.  Increase potassium chloride to 10 meq TID x 3 days then back to 10 meq BID. 5.  Discontinue oral iron.  Diet is good.  Hemoglobin is normal. 6.  RTC in 6 months for labs (BMP) + Prolia. 7.  RTC in 12 months for MD assessment, labs (CBC with diff, CMP, CA27.29), review of mammogram, and Prolia.   Lequita Asal, MD  12/04/2018, 4:35 PM

## 2018-12-05 LAB — CANCER ANTIGEN 27.29: CA 27.29: 16.2 U/mL (ref 0.0–38.6)

## 2018-12-27 ENCOUNTER — Emergency Department
Admission: EM | Admit: 2018-12-27 | Discharge: 2018-12-27 | Disposition: A | Discharge status: Home / Self Care | Payer: Medicare HMO | Attending: Emergency Medicine | Admitting: Emergency Medicine

## 2018-12-27 ENCOUNTER — Emergency Department: Payer: Medicare HMO

## 2018-12-27 ENCOUNTER — Other Ambulatory Visit: Payer: Self-pay

## 2018-12-27 DIAGNOSIS — Z79899 Other long term (current) drug therapy: Secondary | ICD-10-CM | POA: Diagnosis not present

## 2018-12-27 DIAGNOSIS — R42 Dizziness and giddiness: Secondary | ICD-10-CM | POA: Insufficient documentation

## 2018-12-27 DIAGNOSIS — Z853 Personal history of malignant neoplasm of breast: Secondary | ICD-10-CM | POA: Diagnosis not present

## 2018-12-27 DIAGNOSIS — Z923 Personal history of irradiation: Secondary | ICD-10-CM | POA: Diagnosis not present

## 2018-12-27 DIAGNOSIS — I454 Nonspecific intraventricular block: Secondary | ICD-10-CM | POA: Diagnosis not present

## 2018-12-27 DIAGNOSIS — I10 Essential (primary) hypertension: Secondary | ICD-10-CM | POA: Diagnosis not present

## 2018-12-27 DIAGNOSIS — J3489 Other specified disorders of nose and nasal sinuses: Secondary | ICD-10-CM | POA: Diagnosis not present

## 2018-12-27 LAB — CBC
HEMATOCRIT: 43.5 % (ref 36.0–46.0)
Hemoglobin: 13.9 g/dL (ref 12.0–15.0)
MCH: 27.6 pg (ref 26.0–34.0)
MCHC: 32 g/dL (ref 30.0–36.0)
MCV: 86.5 fL (ref 80.0–100.0)
Platelets: 391 10*3/uL (ref 150–400)
RBC: 5.03 MIL/uL (ref 3.87–5.11)
RDW: 15.1 % (ref 11.5–15.5)
WBC: 9 10*3/uL (ref 4.0–10.5)
nRBC: 0 % (ref 0.0–0.2)

## 2018-12-27 LAB — URINALYSIS, COMPLETE (UACMP) WITH MICROSCOPIC
BACTERIA UA: NONE SEEN
Bilirubin Urine: NEGATIVE
GLUCOSE, UA: NEGATIVE mg/dL
Hgb urine dipstick: NEGATIVE
Ketones, ur: NEGATIVE mg/dL
Leukocytes, UA: NEGATIVE
NITRITE: NEGATIVE
PROTEIN: 30 mg/dL — AB
Specific Gravity, Urine: 1.013 (ref 1.005–1.030)
pH: 7 (ref 5.0–8.0)

## 2018-12-27 LAB — BASIC METABOLIC PANEL
Anion gap: 9 (ref 5–15)
BUN: 12 mg/dL (ref 8–23)
CO2: 26 mmol/L (ref 22–32)
Calcium: 9.3 mg/dL (ref 8.9–10.3)
Chloride: 103 mmol/L (ref 98–111)
Creatinine, Ser: 0.73 mg/dL (ref 0.44–1.00)
GFR calc Af Amer: >60 mL/min (ref >60)
GFR calc non Af Amer: >60 mL/min (ref >60)
Glucose, Bld: 136 mg/dL — ABNORMAL HIGH (ref 70–99)
Potassium: 3 mmol/L — ABNORMAL LOW (ref 3.5–5.1)
Sodium: 138 mmol/L (ref 135–145)

## 2018-12-27 MED ORDER — MECLIZINE HCL 25 MG PO TABS
25.0000 mg | ORAL_TABLET | Freq: Once | ORAL | Status: AC
  Administered 2018-12-27: 25 mg via ORAL
  Filled 2018-12-27: qty 1

## 2018-12-27 MED ORDER — MECLIZINE HCL 25 MG PO TABS: 25.0000 mg | ORAL_TABLET | Freq: Three times a day (TID) | ORAL | 0 refills | Status: DC | PRN

## 2018-12-27 NOTE — ED Notes (Signed)
Pt back from MRI 

## 2018-12-27 NOTE — ED Notes (Signed)
Patient transported to MRI 

## 2018-12-27 NOTE — ED Provider Notes (Signed)
[facility] Emergency Department Provider Note   ____________________________________________    I have reviewed the triage vital signs and the nursing notes.   HISTORY  Chief Complaint Dizziness and Weakness     HPI Tina Johnston is a 78 y.o. female who presents with complaints of dizziness.  Patient reports she started feeling "swimmy headed "this morning.  She became concerned because she has a history of vertigo and was admitted for it 5 years ago.  She denies focal weakness.  Does complain of some sinus pressure for which she took sinus medication.  Feels like she may have fluid in her ears.  No nausea or vomiting.  She reports in the past meclizine worked well for her   Past Medical History:  Diagnosis Date  . Arthritis   . Breast cancer ([place]) 2014   Bilateral- radiation  . Breast cyst   . Hyperlipidemia   . Hypertension   . Personal history of radiation therapy 2014   Bilateral lumpectomy    Patient Active Problem List   Diagnosis Date Noted  . Osteopenia of neck of left femur 06/12/2017  . Malignant neoplasm of upper-outer quadrant of female breast ([place]) 07/13/2013    Past Surgical History:  Procedure Laterality Date  . ABDOMINAL HYSTERECTOMY  1968  . BREAST BIOPSY Bilateral 2014   Bilateral Bx Both Positive c Bilateral Lumpectomy  . BREAST CYST ASPIRATION Right 2014   Positive  . BREAST LUMPECTOMY Bilateral 2014   f/u radiation   . BREAST SURGERY Bilateral July 15 2013   wide excision w sentinel node biopsy  . SENTINEL LYMPH NODE BIOPSY Bilateral     Prior to Admission medications   Medication Sig Start Date End Date Taking? Authorizing Provider  acetaminophen (TYLENOL) 500 MG tablet Take by mouth.    [provider]  amLODipine (NORVASC) 5 MG tablet Take 1 tablet by mouth daily.    [provider]  anastrozole (ARIMIDEX) 1 MG tablet TAKE 1 TABLET EVERY DAY Patient not taking: Reported on 12/04/2018  12/16/17   Lequita Asal, MD  Ascorbic Acid (VITAMIN C) 100 MG tablet Take 100 mg by mouth daily.    [provider]  Calcium Carb-Cholecalciferol 600-800 MG-UNIT TABS Take 1 tablet by mouth 2 (two) times daily.    [provider]  ferrous sulfate 325 (65 FE) MG EC tablet Take 325 mg by mouth 3 (three) times daily with meals.    [provider]  fluticasone (FLONASE) 50 MCG/ACT nasal spray Place 1 spray into both nostrils daily.    [provider]  gabapentin (NEURONTIN) 100 MG capsule Take by mouth. 11/05/18 11/05/19  [provider]  hydrochlorothiazide (MICROZIDE) 12.5 MG capsule Take 12.5 mg by mouth daily.    [provider]  meclizine (ANTIVERT) 25 MG tablet Take 1 tablet (25 mg total) by mouth 3 (three) times daily as needed for dizziness. 12/27/18   Lavonia Drafts, MD  Multiple Vitamins-Minerals (MULTIVITAMIN WITH MINERALS) tablet Take by mouth. 07/06/14   [provider]  potassium chloride (K-DUR) 10 MEQ tablet Take 1 tablet (10 mEq total) by mouth daily. Patient taking differently: Take 10 mEq by mouth 2 (two) times daily.  08/30/16   Triplett, Cari B, FNP  simvastatin (ZOCOR) 20 MG tablet Take 1 tablet by mouth daily.    [provider]     Allergies Ultram [tramadol]  Family History  Problem Relation Age of Onset  . Cancer Mother 40  breast  . Breast cancer Mother 38  . Ovarian cancer Paternal Grandmother     Social History Social History   Tobacco Use  . Smoking status: Never Smoker  . Smokeless tobacco: Never Used  Substance Use Topics  . Alcohol use: No  . Drug use: No    Review of Systems  Constitutional: No fever/chills Eyes: No visual changes.  ENT: No sore throat. Cardiovascular: Denies chest pain. Respiratory: Denies shortness of breath. Gastrointestinal: No abdominal pain.  No nausea, no vomiting.   Genitourinary: Negative for dysuria. Musculoskeletal: Negative for back  pain. Skin: Negative for rash. Neurological: Negative for headaches or weakness   ____________________________________________   PHYSICAL EXAM:  VITAL SIGNS: ED Triage Vitals  Enc Vitals Group     BP 12/27/18 1017 (!) 181/80     Pulse Rate 12/27/18 1017 78     Resp 12/27/18 1017 14     Temp 12/27/18 1017 98 F (36.7 C)     Temp Source 12/27/18 1017 Oral     SpO2 12/27/18 1028 100 %     Weight 12/27/18 1017 65.8 kg (145 lb)     Height 12/27/18 1017 1.6 m (5\' 3" )     Head Circumference --      Peak Flow --      Pain Score 12/27/18 1026 0     Pain Loc --      Pain Edu? --      Excl. in [place]? --     Constitutional: Alert and oriented. No acute distress.  Eyes: Conjunctivae are normal.  PERRLA, EOMI, lateral nystagmus   Mouth/Throat: Mucous membranes are moist.    Cardiovascular: Normal rate, regular rhythm. Grossly normal heart sounds.  Good peripheral circulation. Respiratory: Normal respiratory effort.  No retractions. Lungs CTAB. Gastrointestinal: Soft and nontender. No distention.   Musculoskeletal:  Warm and well perfused Neurologic:   No gross focal neurologic deficits are appreciated.  Skin:  Skin is warm, dry and intact. No rash noted. Psychiatric: Mood and affect are normal. Speech and behavior are normal.  ____________________________________________   LABS (all labs ordered are listed, but only abnormal results are displayed)  Labs Reviewed  BASIC METABOLIC PANEL - Abnormal; Notable for the following components:      Result Value   Potassium 3.0 (*)    Glucose, Bld 136 (*)    All other components within normal limits  URINALYSIS, COMPLETE (UACMP) WITH MICROSCOPIC - Abnormal; Notable for the following components:   Color, Urine YELLOW (*)    APPearance CLOUDY (*)    Protein, ur 30 (*)    All other components within normal limits  CBC   ____________________________________________  EKG  ED ECG REPORT I, Lavonia Drafts, the attending physician,  personally viewed and interpreted this ECG.  Date: 12/27/2018  Rhythm: normal sinus rhythm QRS Axis: Left axis deviation Intervals: Bundle branch block ST/T Wave abnormalities: normal   ____________________________________________  RADIOLOGY  MRI brain, no acute distress ____________________________________________   PROCEDURES  Procedure(s) performed: No  Procedures   Critical Care performed: No ____________________________________________   INITIAL IMPRESSION / ASSESSMENT AND PLAN / ED COURSE  Pertinent labs & imaging results that were available during my care of the patient were reviewed by me and considered in my medical decision making (see chart for details).  Patient presents with vertigo, likely BPV however given her age and prolonged course since last episode will obtain MRI  MRI negative, will treat with meclizine, outpatient follow-up    ____________________________________________  FINAL CLINICAL IMPRESSION(S) / ED DIAGNOSES  Final diagnoses:  Vertigo        Note:  This document was prepared using Dragon voice recognition software and may include unintentional dictation errors.   Lavonia Drafts, MD 12/27/18 1524

## 2018-12-27 NOTE — ED Triage Notes (Signed)
Pt states in the middle of the night she began feeling dizzy, states that the dizziness has persisted, states hx of vertigo, states that she has had a little nasal congestion, states that her head just feels heavy denies weakness, numbness or tingling in her extremities.

## 2018-12-30 DIAGNOSIS — E782 Mixed hyperlipidemia: Secondary | ICD-10-CM | POA: Diagnosis not present

## 2018-12-30 DIAGNOSIS — I10 Essential (primary) hypertension: Secondary | ICD-10-CM | POA: Diagnosis not present

## 2018-12-30 DIAGNOSIS — M79672 Pain in left foot: Secondary | ICD-10-CM | POA: Diagnosis not present

## 2018-12-30 DIAGNOSIS — R7303 Prediabetes: Secondary | ICD-10-CM | POA: Diagnosis not present

## 2018-12-30 DIAGNOSIS — M79671 Pain in right foot: Secondary | ICD-10-CM | POA: Diagnosis not present

## 2019-01-06 DIAGNOSIS — R7303 Prediabetes: Secondary | ICD-10-CM | POA: Diagnosis not present

## 2019-01-06 DIAGNOSIS — M150 Primary generalized (osteo)arthritis: Secondary | ICD-10-CM | POA: Diagnosis not present

## 2019-01-06 DIAGNOSIS — I10 Essential (primary) hypertension: Secondary | ICD-10-CM | POA: Diagnosis not present

## 2019-01-06 DIAGNOSIS — R002 Palpitations: Secondary | ICD-10-CM | POA: Diagnosis not present

## 2019-01-06 DIAGNOSIS — C50911 Malignant neoplasm of unspecified site of right female breast: Secondary | ICD-10-CM | POA: Diagnosis not present

## 2019-01-06 DIAGNOSIS — Z170 Estrogen receptor positive status [ER+]: Secondary | ICD-10-CM | POA: Diagnosis not present

## 2019-01-06 DIAGNOSIS — E782 Mixed hyperlipidemia: Secondary | ICD-10-CM | POA: Diagnosis not present

## 2019-01-06 DIAGNOSIS — R42 Dizziness and giddiness: Secondary | ICD-10-CM | POA: Diagnosis not present

## 2019-01-06 DIAGNOSIS — C50912 Malignant neoplasm of unspecified site of left female breast: Secondary | ICD-10-CM | POA: Diagnosis not present

## 2019-01-19 DIAGNOSIS — Z0100 Encounter for examination of eyes and vision without abnormal findings: Secondary | ICD-10-CM | POA: Diagnosis not present

## 2019-01-19 DIAGNOSIS — R002 Palpitations: Secondary | ICD-10-CM | POA: Diagnosis not present

## 2019-01-19 DIAGNOSIS — H2513 Age-related nuclear cataract, bilateral: Secondary | ICD-10-CM | POA: Diagnosis not present

## 2019-01-19 DIAGNOSIS — H52223 Regular astigmatism, bilateral: Secondary | ICD-10-CM | POA: Diagnosis not present

## 2019-01-19 DIAGNOSIS — H524 Presbyopia: Secondary | ICD-10-CM | POA: Diagnosis not present

## 2019-01-19 DIAGNOSIS — H5203 Hypermetropia, bilateral: Secondary | ICD-10-CM | POA: Diagnosis not present

## 2019-02-17 DIAGNOSIS — I10 Essential (primary) hypertension: Secondary | ICD-10-CM | POA: Diagnosis not present

## 2019-02-26 ENCOUNTER — Other Ambulatory Visit: Payer: Self-pay | Admitting: Urgent Care

## 2019-02-26 DIAGNOSIS — E876 Hypokalemia: Secondary | ICD-10-CM | POA: Insufficient documentation

## 2019-02-26 DIAGNOSIS — Z7189 Other specified counseling: Secondary | ICD-10-CM | POA: Insufficient documentation

## 2019-02-26 DIAGNOSIS — M85852 Other specified disorders of bone density and structure, left thigh: Secondary | ICD-10-CM

## 2019-02-26 DIAGNOSIS — Z79811 Long term (current) use of aromatase inhibitors: Secondary | ICD-10-CM

## 2019-04-12 ENCOUNTER — Other Ambulatory Visit: Payer: Medicare HMO

## 2019-05-06 ENCOUNTER — Ambulatory Visit: Payer: Medicare HMO

## 2019-06-02 ENCOUNTER — Telehealth: Payer: Self-pay | Admitting: Hematology and Oncology

## 2019-06-03 ENCOUNTER — Other Ambulatory Visit: Payer: Self-pay | Admitting: Internal Medicine

## 2019-06-03 ENCOUNTER — Ambulatory Visit
Admission: RE | Admit: 2019-06-03 | Discharge: 2019-06-03 | Disposition: A | Discharge status: Home / Self Care | Payer: Medicare HMO | Source: Ambulatory Visit | Attending: Hematology and Oncology | Admitting: Hematology and Oncology

## 2019-06-03 ENCOUNTER — Other Ambulatory Visit: Payer: Self-pay

## 2019-06-03 ENCOUNTER — Encounter: Payer: Self-pay | Admitting: Hematology and Oncology

## 2019-06-03 DIAGNOSIS — Z1231 Encounter for screening mammogram for malignant neoplasm of breast: Secondary | ICD-10-CM | POA: Insufficient documentation

## 2019-06-03 DIAGNOSIS — Z170 Estrogen receptor positive status [ER+]: Secondary | ICD-10-CM | POA: Insufficient documentation

## 2019-06-03 DIAGNOSIS — C50419 Malignant neoplasm of upper-outer quadrant of unspecified female breast: Secondary | ICD-10-CM | POA: Diagnosis not present

## 2019-06-07 ENCOUNTER — Inpatient Hospital Stay: Payer: Medicare HMO | Attending: Internal Medicine

## 2019-06-07 ENCOUNTER — Inpatient Hospital Stay: Payer: Medicare HMO

## 2019-06-07 ENCOUNTER — Other Ambulatory Visit: Payer: Self-pay

## 2019-06-07 DIAGNOSIS — C50419 Malignant neoplasm of upper-outer quadrant of unspecified female breast: Secondary | ICD-10-CM

## 2019-06-07 DIAGNOSIS — Z170 Estrogen receptor positive status [ER+]: Secondary | ICD-10-CM

## 2019-06-30 ENCOUNTER — Other Ambulatory Visit: Payer: Self-pay

## 2019-06-30 ENCOUNTER — Ambulatory Visit
Admission: RE | Admit: 2019-06-30 | Discharge: 2019-06-30 | Disposition: A | Discharge status: Home / Self Care | Payer: Medicare HMO | Source: Ambulatory Visit | Attending: Urgent Care | Admitting: Urgent Care

## 2019-06-30 DIAGNOSIS — M85852 Other specified disorders of bone density and structure, left thigh: Secondary | ICD-10-CM | POA: Insufficient documentation

## 2019-06-30 DIAGNOSIS — Z79811 Long term (current) use of aromatase inhibitors: Secondary | ICD-10-CM | POA: Diagnosis not present

## 2019-06-30 DIAGNOSIS — M8589 Other specified disorders of bone density and structure, multiple sites: Secondary | ICD-10-CM | POA: Diagnosis not present

## 2019-07-12 DIAGNOSIS — R7303 Prediabetes: Secondary | ICD-10-CM | POA: Diagnosis not present

## 2019-07-12 DIAGNOSIS — E782 Mixed hyperlipidemia: Secondary | ICD-10-CM | POA: Diagnosis not present

## 2019-07-12 DIAGNOSIS — I10 Essential (primary) hypertension: Secondary | ICD-10-CM | POA: Diagnosis not present

## 2019-07-19 DIAGNOSIS — Z170 Estrogen receptor positive status [ER+]: Secondary | ICD-10-CM | POA: Diagnosis not present

## 2019-07-19 DIAGNOSIS — R7303 Prediabetes: Secondary | ICD-10-CM | POA: Diagnosis not present

## 2019-07-19 DIAGNOSIS — M8949 Other hypertrophic osteoarthropathy, multiple sites: Secondary | ICD-10-CM | POA: Diagnosis not present

## 2019-07-19 DIAGNOSIS — C50912 Malignant neoplasm of unspecified site of left female breast: Secondary | ICD-10-CM | POA: Diagnosis not present

## 2019-07-19 DIAGNOSIS — E782 Mixed hyperlipidemia: Secondary | ICD-10-CM | POA: Diagnosis not present

## 2019-07-19 DIAGNOSIS — C50911 Malignant neoplasm of unspecified site of right female breast: Secondary | ICD-10-CM | POA: Diagnosis not present

## 2019-07-19 DIAGNOSIS — I10 Essential (primary) hypertension: Secondary | ICD-10-CM | POA: Diagnosis not present

## 2019-07-19 DIAGNOSIS — Z0000 Encounter for general adult medical examination without abnormal findings: Secondary | ICD-10-CM | POA: Diagnosis not present

## 2019-12-06 ENCOUNTER — Ambulatory Visit: Payer: Medicare HMO

## 2019-12-06 ENCOUNTER — Inpatient Hospital Stay: Payer: Medicare HMO | Attending: Internal Medicine | Admitting: Internal Medicine

## 2019-12-06 ENCOUNTER — Encounter: Payer: Self-pay | Admitting: Internal Medicine

## 2019-12-06 ENCOUNTER — Other Ambulatory Visit: Payer: Self-pay | Admitting: Licensed Clinical Social Worker

## 2019-12-06 ENCOUNTER — Other Ambulatory Visit: Payer: Medicare HMO

## 2019-12-06 ENCOUNTER — Ambulatory Visit: Payer: Medicare HMO | Admitting: Hematology and Oncology

## 2019-12-06 ENCOUNTER — Inpatient Hospital Stay: Payer: Medicare HMO

## 2019-12-06 ENCOUNTER — Other Ambulatory Visit: Payer: Self-pay

## 2019-12-06 DIAGNOSIS — Z803 Family history of malignant neoplasm of breast: Secondary | ICD-10-CM | POA: Insufficient documentation

## 2019-12-06 DIAGNOSIS — C50912 Malignant neoplasm of unspecified site of left female breast: Secondary | ICD-10-CM

## 2019-12-06 DIAGNOSIS — Z90710 Acquired absence of both cervix and uterus: Secondary | ICD-10-CM | POA: Diagnosis not present

## 2019-12-06 DIAGNOSIS — Z170 Estrogen receptor positive status [ER+]: Secondary | ICD-10-CM | POA: Diagnosis not present

## 2019-12-06 DIAGNOSIS — C50911 Malignant neoplasm of unspecified site of right female breast: Secondary | ICD-10-CM

## 2019-12-06 DIAGNOSIS — C50411 Malignant neoplasm of upper-outer quadrant of right female breast: Secondary | ICD-10-CM | POA: Diagnosis not present

## 2019-12-06 DIAGNOSIS — I10 Essential (primary) hypertension: Secondary | ICD-10-CM | POA: Diagnosis not present

## 2019-12-06 DIAGNOSIS — M8580 Other specified disorders of bone density and structure, unspecified site: Secondary | ICD-10-CM | POA: Insufficient documentation

## 2019-12-06 DIAGNOSIS — R05 Cough: Secondary | ICD-10-CM | POA: Insufficient documentation

## 2019-12-06 DIAGNOSIS — Z853 Personal history of malignant neoplasm of breast: Secondary | ICD-10-CM | POA: Insufficient documentation

## 2019-12-06 DIAGNOSIS — Z923 Personal history of irradiation: Secondary | ICD-10-CM | POA: Diagnosis not present

## 2019-12-06 DIAGNOSIS — Z780 Asymptomatic menopausal state: Secondary | ICD-10-CM | POA: Insufficient documentation

## 2019-12-06 DIAGNOSIS — C50419 Malignant neoplasm of upper-outer quadrant of unspecified female breast: Secondary | ICD-10-CM

## 2019-12-06 LAB — CBC WITH DIFFERENTIAL/PLATELET
Abs Immature Granulocytes: 0.01 10*3/uL (ref 0.00–0.07)
Basophils Absolute: 0 10*3/uL (ref 0.0–0.1)
Basophils Relative: 0 %
Eosinophils Absolute: 0.2 10*3/uL (ref 0.0–0.5)
Eosinophils Relative: 3 %
HCT: 40.5 % (ref 36.0–46.0)
Hemoglobin: 12.3 g/dL (ref 12.0–15.0)
Immature Granulocytes: 0 %
Lymphocytes Relative: 44 %
Lymphs Abs: 3.4 10*3/uL (ref 0.7–4.0)
MCH: 26.7 pg (ref 26.0–34.0)
MCHC: 30.4 g/dL (ref 30.0–36.0)
MCV: 87.9 fL (ref 80.0–100.0)
Monocytes Absolute: 0.5 10*3/uL (ref 0.1–1.0)
Monocytes Relative: 6 %
Neutro Abs: 3.6 10*3/uL (ref 1.7–7.7)
Neutrophils Relative %: 47 %
Platelets: 372 10*3/uL (ref 150–400)
RBC: 4.61 MIL/uL (ref 3.87–5.11)
RDW: 14.2 % (ref 11.5–15.5)
WBC: 7.8 10*3/uL (ref 4.0–10.5)
nRBC: 0 % (ref 0.0–0.2)

## 2019-12-06 LAB — COMPREHENSIVE METABOLIC PANEL
ALT: 22 U/L (ref 0–44)
AST: 25 U/L (ref 15–41)
Albumin: 4 g/dL (ref 3.5–5.0)
Alkaline Phosphatase: 62 U/L (ref 38–126)
Anion gap: 10 (ref 5–15)
BUN: 10 mg/dL (ref 8–23)
CO2: 26 mmol/L (ref 22–32)
Calcium: 9.3 mg/dL (ref 8.9–10.3)
Chloride: 103 mmol/L (ref 98–111)
Creatinine, Ser: 0.84 mg/dL (ref 0.44–1.00)
GFR calc Af Amer: >60 mL/min (ref >60)
GFR calc non Af Amer: >60 mL/min (ref >60)
Glucose, Bld: 152 mg/dL — ABNORMAL HIGH (ref 70–99)
Potassium: 3.4 mmol/L — ABNORMAL LOW (ref 3.5–5.1)
Sodium: 139 mmol/L (ref 135–145)
Total Bilirubin: 0.6 mg/dL (ref 0.3–1.2)
Total Protein: 7.6 g/dL (ref 6.5–8.1)

## 2019-12-06 NOTE — Progress Notes (Signed)
[place] CONSULT NOTE  Patient Care Team: Marinda Elk, MD as PCP - General (Physician Assistant) Christene Lye, MD (General Surgery)  CHIEF COMPLAINTS/PURPOSE OF CONSULTATION: Breast cancer  #  Oncology History Overview Note  July 2014 -bilateral breast cancer s/p bilateral lumpectomy; s/p bilateral breast radiation of 5040 cGy from 09/07/2013 until 10/14/2013.   # RIGHT IMC-  STAGE I [1.1cm; G-1; ER >90%; PR-NEG; Her- 2 NEG; Oncotype-Low risk RS- 15 (low risk) which corresponded to a 10 year risk of distant recurrence with tamoxifen alone of 9%]  # Left breast - [facility]  revealed 2 foci of 0.6 cm each. Three sentinel lymph nodes were negative.  Tumor was ER positive (90%), PR positive (80%), and HER-2/neu negative.    BCI testing on 11/13/2018 revealed a 3.7% (95% CI:  1.4% - 5.9%) risk of late recurrence between years 5-10 and a low likelihood of benefit.  DIAGNOSIS: Bil breast cancer  STAGE:  Right-I & Left-I    ;  GOALS: cure  CURRENT/MOST RECENT THERAPY :     Bilateral breast cancer ([place])  08/10/2013 Initial Diagnosis   Bilateral breast cancer (HCC)      HISTORY OF PRESENTING ILLNESS:  Tina Johnston 77 y.o.  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  Patient finished 5 years of Arimidex approximately year ago.  No nausea no vomiting.  No bone pain.  No nausea no vomiting.  Has chronic mild joint pains back pain.  Chronic mild cough.  No new shortness of breath.  No sputum production no blood when coughing.  No new lumps or bumps.  Review of Systems  Constitutional: Negative for chills, diaphoresis, fever, malaise/fatigue and weight loss.  HENT: Negative for nosebleeds and sore throat.   Eyes: Negative for double vision.  Respiratory: Positive for cough. Negative for hemoptysis, sputum production, shortness of breath and wheezing.   Cardiovascular: Negative for  chest pain, palpitations, orthopnea and leg swelling.  Gastrointestinal: Negative for abdominal pain, blood in stool, constipation, diarrhea, heartburn, melena, nausea and vomiting.  Genitourinary: Negative for dysuria, frequency and urgency.  Musculoskeletal: Positive for back pain and joint pain.  Skin: Negative.  Negative for itching and rash.  Neurological: Negative for dizziness, tingling, focal weakness, weakness and headaches.  Endo/Heme/Allergies: Does not bruise/bleed easily.  Psychiatric/Behavioral: Negative for depression. The patient is not nervous/anxious and does not have insomnia.      MEDICAL HISTORY:  Past Medical History:  Diagnosis Date  . Arthritis   . Breast cancer ([place]) 2014   Bilateral- radiation  . Breast cyst   . Hyperlipidemia   . Hypertension   . Personal history of radiation therapy 2014   Bilateral lumpectomy    SURGICAL HISTORY: Past Surgical History:  Procedure Laterality Date  . ABDOMINAL HYSTERECTOMY  1968  . BREAST BIOPSY Bilateral 2014   Bilateral Bx Both Positive c Bilateral Lumpectomy  . BREAST CYST ASPIRATION Right 2014   Positive  . BREAST LUMPECTOMY Bilateral 2014   f/u radiation   . BREAST SURGERY Bilateral July 15 2013   wide excision w sentinel node biopsy  . SENTINEL LYMPH NODE BIOPSY Bilateral     SOCIAL HISTORY: Social History   Socioeconomic History  . Marital status: Divorced    Spouse name: Not on file  . Number of children: Not on file  . Years of education: Not on file  . Highest education level: Not on file  Occupational History  . Not on file  Tobacco Use  . Smoking status: Never Smoker  . Smokeless tobacco: Never Used  Substance and Sexual Activity  . Alcohol use: No  . Drug use: No  . Sexual activity: Not on file  Other Topics Concern  . Not on file  Social History Narrative  . Not on file   Social Determinants of Health   Financial Resource Strain:   . Difficulty of Paying Living Expenses: Not on  file  Food Insecurity:   . Worried About Charity fundraiser in the Last Year: Not on file  . Ran Out of Food in the Last Year: Not on file  Transportation Needs:   . Lack of Transportation (Medical): Not on file  . Lack of Transportation (Non-Medical): Not on file  Physical Activity:   . Days of Exercise per Week: Not on file  . Minutes of Exercise per Session: Not on file  Stress:   . Feeling of Stress : Not on file  Social Connections:   . Frequency of Communication with Friends and Family: Not on file  . Frequency of Social Gatherings with Friends and Family: Not on file  . Attends Religious Services: Not on file  . Active Member of Clubs or Organizations: Not on file  . Attends Archivist Meetings: Not on file  . Marital Status: Not on file  Intimate Partner Violence:   . Fear of Current or Ex-Partner: Not on file  . Emotionally Abused: Not on file  . Physically Abused: Not on file  . Sexually Abused: Not on file    FAMILY HISTORY: Family History  Problem Relation Age of Onset  . Cancer Mother 69       breast  . Breast cancer Mother 62  . Ovarian cancer Paternal Grandmother     ALLERGIES:  is allergic to ultram [tramadol].  MEDICATIONS:  Current Outpatient Medications  Medication Sig Dispense Refill  . acetaminophen (TYLENOL) 500 MG tablet Take by mouth.    Marland Kitchen amLODipine (NORVASC) 5 MG tablet Take 1 tablet by mouth daily.    Marland Kitchen anastrozole (ARIMIDEX) 1 MG tablet TAKE 1 TABLET EVERY DAY 90 tablet 0  . Ascorbic Acid (VITAMIN C) 100 MG tablet Take 100 mg by mouth daily.    . Calcium Carb-Cholecalciferol 600-800 MG-UNIT TABS Take 1 tablet by mouth 2 (two) times daily.    . ferrous sulfate 325 (65 FE) MG EC tablet Take 325 mg by mouth 3 (three) times daily with meals.    . fluticasone (FLONASE) 50 MCG/ACT nasal spray Place 1 spray into both nostrils daily.    . hydrochlorothiazide (MICROZIDE) 12.5 MG capsule Take 12.5 mg by mouth daily.    . meclizine  (ANTIVERT) 25 MG tablet Take 1 tablet (25 mg total) by mouth 3 (three) times daily as needed for dizziness. 20 tablet 0  . Multiple Vitamins-Minerals (MULTIVITAMIN WITH MINERALS) tablet Take by mouth.    . potassium chloride (K-DUR) 10 MEQ tablet Take 1 tablet (10 mEq total) by mouth daily. (Patient taking differently: Take 10 mEq by mouth 2 (two) times daily. ) 7 tablet 0  . simvastatin (ZOCOR) 20 MG tablet Take 1 tablet by mouth daily.    Marland Kitchen gabapentin (NEURONTIN) 100 MG capsule Take by mouth.     No current facility-administered medications for this visit.      Marland Kitchen  PHYSICAL EXAMINATION: ECOG PERFORMANCE STATUS: 0 - Asymptomatic  Vitals:   12/06/19 1005  BP: Marland Kitchen)  180/82  Pulse: 90  Temp: 97.6 F (36.4 C)   Filed Weights   12/06/19 1005  Weight: 150 lb (68 kg)    Physical Exam  Constitutional: She is oriented to person, place, and time and well-developed, well-nourished, and in no distress.  HENT:  Head: Normocephalic and atraumatic.  Mouth/Throat: Oropharynx is clear and moist. No oropharyngeal exudate.  Eyes: Pupils are equal, round, and reactive to light.  Cardiovascular: Normal rate and regular rhythm.  Pulmonary/Chest: No respiratory distress. She has no wheezes.  Abdominal: Soft. Bowel sounds are normal. She exhibits no distension and no mass. There is no abdominal tenderness. There is no rebound and no guarding.  Musculoskeletal:        General: No tenderness or edema. Normal range of motion.     Cervical back: Normal range of motion and neck supple.  Neurological: She is alert and oriented to person, place, and time.  Skin: Skin is warm.  Psychiatric: Affect normal.     LABORATORY DATA:  I have reviewed the data as listed Lab Results  Component Value Date   WBC 7.8 12/06/2019   HGB 12.3 12/06/2019   HCT 40.5 12/06/2019   MCV 87.9 12/06/2019   PLT 372 12/06/2019   Recent Labs    12/27/18 1031 12/06/19 0949  NA 138 139  K 3.0* 3.4*  CL 103 103  CO2 26  26  GLUCOSE 136* 152*  BUN 12 10  CREATININE 0.73 0.84  CALCIUM 9.3 9.3  GFRNONAA >60 >60  GFRAA >60 >60  PROT  --  7.6  ALBUMIN  --  4.0  AST  --  25  ALT  --  22  ALKPHOS  --  62  BILITOT  --  0.6    RADIOGRAPHIC STUDIES: I have personally reviewed the radiological images as listed and agreed with the findings in the report. No results found.  ASSESSMENT & PLAN:   Bilateral breast cancer ([place])      Carcinoma of upper-outer quadrant of right breast in female, estrogen receptor positive ([place]) # Bil breast cancer- Stage I ER/Pos; her 2 NEG- stopped anastrzole x5 years [finished 2019].   #Stable.  Clinically no evidence of recurrence.  Continue surveillance on a yearly basis.  # Genetics- sister diagnosed with breast cancer at 78y; mother- breast cancer.  Patient has 2 children son  [58]; daughter- 16 [brain tumor-doing well] Patient meets the NCCN criteria for genetic testing. Discussed the potential implications of a positive test-for herself; and for the rest of the family.  Patient wants to think about this and let us know if she is interested to move forward with genetics referral.  # Elavetd HTN- 180/80; at home 170s; recommend follow up with [facility]/Ms.Mclaiughjlin..   # OSTEOPENIA- T score=1.6; physically active; ca+vit D BID. Discussed the potential risk factors for osteoporosis- age/gender/postmenopausal status/use of anti-estrogen treatments. Discussed multiple options including exercise/ calcium and vitamin D supplementation/ and also use of bisphosphonates. Discussed oral bisphosphonates versus parenteral bisphosphonate like Reclast/ RANK ligand inhibitors- desnosumab; also discusssed re: Boniva/Foasomax;re: reflux. wil monitor for now.   #Chronic mild cough for the last 5 years-not any worse.  Question sinus versus others.  Continue antiallergy medication at this time.  If worse let us know we will get chest x-ray/imaging.  # DISPOSITION:  # Follow up in 12 months-   MD; labs- cbc/cmp; bil screeening mamogram in June 2021-Dr.B  All questions were answered. The patient knows to call the clinic with any problems, questions  or concerns.    Cammie Sickle, MD 12/06/2019 12:57 PM

## 2019-12-06 NOTE — Assessment & Plan Note (Signed)
#   Bil breast cancer- Stage I ER/Pos; her 2 NEG- stopped anastrzole x5 years [finished 2019].   #Stable.  Clinically no evidence of recurrence.  Continue surveillance on a yearly basis.  # Genetics- sister diagnosed with breast cancer at 35y; mother- breast cancer.  Patient has 2 children son  [58]; daughter- 42 [brain tumor-doing well] Patient meets the NCCN criteria for genetic testing. Discussed the potential implications of a positive test-for herself; and for the rest of the family.  Patient wants to think about this and let us know if she is interested to move forward with genetics referral.  # Elavetd HTN- 180/80; at home 170s; recommend follow up with [facility]/Ms.Mclaiughjlin..   # OSTEOPENIA- T score=1.6; physically active; ca+vit D BID. Discussed the potential risk factors for osteoporosis- age/gender/postmenopausal status/use of anti-estrogen treatments. Discussed multiple options including exercise/ calcium and vitamin D supplementation/ and also use of bisphosphonates. Discussed oral bisphosphonates versus parenteral bisphosphonate like Reclast/ RANK ligand inhibitors- desnosumab; also discusssed re: Boniva/Foasomax;re: reflux. wil monitor for now.   #Chronic mild cough for the last 5 years-not any worse.  Question sinus versus others.  Continue antiallergy medication at this time.  If worse let us know we will get chest x-ray/imaging.  # DISPOSITION:  # Follow up in 12 months-  MD; labs- cbc/cmp; bil screeening mamogram in June 2021-Dr.B

## 2019-12-07 LAB — CANCER ANTIGEN 27.29: CA 27.29: 13.6 U/mL (ref 0.0–38.6)

## 2020-01-12 DIAGNOSIS — R7303 Prediabetes: Secondary | ICD-10-CM | POA: Diagnosis not present

## 2020-01-12 DIAGNOSIS — E782 Mixed hyperlipidemia: Secondary | ICD-10-CM | POA: Diagnosis not present

## 2020-01-12 DIAGNOSIS — I10 Essential (primary) hypertension: Secondary | ICD-10-CM | POA: Diagnosis not present

## 2020-01-19 DIAGNOSIS — M8949 Other hypertrophic osteoarthropathy, multiple sites: Secondary | ICD-10-CM | POA: Diagnosis not present

## 2020-01-19 DIAGNOSIS — M8580 Other specified disorders of bone density and structure, unspecified site: Secondary | ICD-10-CM | POA: Diagnosis not present

## 2020-01-19 DIAGNOSIS — E782 Mixed hyperlipidemia: Secondary | ICD-10-CM | POA: Diagnosis not present

## 2020-01-19 DIAGNOSIS — Z0000 Encounter for general adult medical examination without abnormal findings: Secondary | ICD-10-CM | POA: Diagnosis not present

## 2020-01-19 DIAGNOSIS — Z853 Personal history of malignant neoplasm of breast: Secondary | ICD-10-CM | POA: Diagnosis not present

## 2020-01-19 DIAGNOSIS — I10 Essential (primary) hypertension: Secondary | ICD-10-CM | POA: Diagnosis not present

## 2020-01-19 DIAGNOSIS — I7300 Raynaud's syndrome without gangrene: Secondary | ICD-10-CM | POA: Diagnosis not present

## 2020-01-19 DIAGNOSIS — Z9013 Acquired absence of bilateral breasts and nipples: Secondary | ICD-10-CM | POA: Diagnosis not present

## 2020-01-19 DIAGNOSIS — R7303 Prediabetes: Secondary | ICD-10-CM | POA: Diagnosis not present

## 2020-01-24 DIAGNOSIS — H2513 Age-related nuclear cataract, bilateral: Secondary | ICD-10-CM | POA: Diagnosis not present

## 2020-01-24 DIAGNOSIS — H52223 Regular astigmatism, bilateral: Secondary | ICD-10-CM | POA: Diagnosis not present

## 2020-01-24 DIAGNOSIS — H5203 Hypermetropia, bilateral: Secondary | ICD-10-CM | POA: Diagnosis not present

## 2020-01-24 DIAGNOSIS — H524 Presbyopia: Secondary | ICD-10-CM | POA: Diagnosis not present

## 2020-01-24 DIAGNOSIS — H40013 Open angle with borderline findings, low risk, bilateral: Secondary | ICD-10-CM | POA: Diagnosis not present

## 2020-03-01 DIAGNOSIS — Z79899 Other long term (current) drug therapy: Secondary | ICD-10-CM | POA: Diagnosis not present

## 2020-03-01 DIAGNOSIS — I10 Essential (primary) hypertension: Secondary | ICD-10-CM | POA: Diagnosis not present

## 2020-04-11 DIAGNOSIS — I10 Essential (primary) hypertension: Secondary | ICD-10-CM | POA: Diagnosis not present

## 2020-06-05 ENCOUNTER — Ambulatory Visit
Admission: RE | Admit: 2020-06-05 | Discharge: 2020-06-05 | Disposition: A | Discharge status: Home / Self Care | Payer: Medicare HMO | Source: Ambulatory Visit | Attending: Internal Medicine | Admitting: Internal Medicine

## 2020-06-05 DIAGNOSIS — Z1231 Encounter for screening mammogram for malignant neoplasm of breast: Secondary | ICD-10-CM | POA: Insufficient documentation

## 2020-06-05 DIAGNOSIS — Z170 Estrogen receptor positive status [ER+]: Secondary | ICD-10-CM

## 2020-07-17 DIAGNOSIS — H5203 Hypermetropia, bilateral: Secondary | ICD-10-CM | POA: Diagnosis not present

## 2020-07-17 DIAGNOSIS — R7303 Prediabetes: Secondary | ICD-10-CM | POA: Diagnosis not present

## 2020-07-17 DIAGNOSIS — H52223 Regular astigmatism, bilateral: Secondary | ICD-10-CM | POA: Diagnosis not present

## 2020-07-17 DIAGNOSIS — H40013 Open angle with borderline findings, low risk, bilateral: Secondary | ICD-10-CM | POA: Diagnosis not present

## 2020-07-17 DIAGNOSIS — H524 Presbyopia: Secondary | ICD-10-CM | POA: Diagnosis not present

## 2020-07-17 DIAGNOSIS — H2513 Age-related nuclear cataract, bilateral: Secondary | ICD-10-CM | POA: Diagnosis not present

## 2020-07-17 DIAGNOSIS — E782 Mixed hyperlipidemia: Secondary | ICD-10-CM | POA: Diagnosis not present

## 2020-07-17 DIAGNOSIS — I10 Essential (primary) hypertension: Secondary | ICD-10-CM | POA: Diagnosis not present

## 2020-07-24 DIAGNOSIS — Z780 Asymptomatic menopausal state: Secondary | ICD-10-CM | POA: Diagnosis not present

## 2020-07-24 DIAGNOSIS — Z853 Personal history of malignant neoplasm of breast: Secondary | ICD-10-CM | POA: Diagnosis not present

## 2020-07-24 DIAGNOSIS — Z0000 Encounter for general adult medical examination without abnormal findings: Secondary | ICD-10-CM | POA: Diagnosis not present

## 2020-07-24 DIAGNOSIS — M8949 Other hypertrophic osteoarthropathy, multiple sites: Secondary | ICD-10-CM | POA: Diagnosis not present

## 2020-07-24 DIAGNOSIS — Z9189 Other specified personal risk factors, not elsewhere classified: Secondary | ICD-10-CM | POA: Diagnosis not present

## 2020-07-24 DIAGNOSIS — I10 Essential (primary) hypertension: Secondary | ICD-10-CM | POA: Diagnosis not present

## 2020-07-24 DIAGNOSIS — E782 Mixed hyperlipidemia: Secondary | ICD-10-CM | POA: Diagnosis not present

## 2020-07-24 DIAGNOSIS — R7303 Prediabetes: Secondary | ICD-10-CM | POA: Diagnosis not present

## 2020-12-05 ENCOUNTER — Other Ambulatory Visit: Payer: Self-pay | Admitting: *Deleted

## 2020-12-05 ENCOUNTER — Inpatient Hospital Stay: Payer: Medicare HMO | Admitting: Internal Medicine

## 2020-12-05 ENCOUNTER — Encounter: Payer: Self-pay | Admitting: Internal Medicine

## 2020-12-05 ENCOUNTER — Other Ambulatory Visit: Payer: Self-pay

## 2020-12-05 ENCOUNTER — Inpatient Hospital Stay: Payer: Medicare HMO | Attending: Internal Medicine

## 2020-12-05 DIAGNOSIS — Z853 Personal history of malignant neoplasm of breast: Secondary | ICD-10-CM | POA: Insufficient documentation

## 2020-12-05 DIAGNOSIS — Z90710 Acquired absence of both cervix and uterus: Secondary | ICD-10-CM | POA: Diagnosis not present

## 2020-12-05 DIAGNOSIS — C50411 Malignant neoplasm of upper-outer quadrant of right female breast: Secondary | ICD-10-CM | POA: Diagnosis not present

## 2020-12-05 DIAGNOSIS — Z8041 Family history of malignant neoplasm of ovary: Secondary | ICD-10-CM | POA: Diagnosis not present

## 2020-12-05 DIAGNOSIS — M8580 Other specified disorders of bone density and structure, unspecified site: Secondary | ICD-10-CM | POA: Insufficient documentation

## 2020-12-05 DIAGNOSIS — Z170 Estrogen receptor positive status [ER+]: Secondary | ICD-10-CM

## 2020-12-05 DIAGNOSIS — Z803 Family history of malignant neoplasm of breast: Secondary | ICD-10-CM | POA: Insufficient documentation

## 2020-12-05 DIAGNOSIS — C50419 Malignant neoplasm of upper-outer quadrant of unspecified female breast: Secondary | ICD-10-CM

## 2020-12-05 LAB — COMPREHENSIVE METABOLIC PANEL
ALT: 17 U/L (ref 0–44)
AST: 22 U/L (ref 15–41)
Albumin: 3.9 g/dL (ref 3.5–5.0)
Alkaline Phosphatase: 50 U/L (ref 38–126)
Anion gap: 13 (ref 5–15)
BUN: 12 mg/dL (ref 8–23)
CO2: 25 mmol/L (ref 22–32)
Calcium: 9.2 mg/dL (ref 8.9–10.3)
Chloride: 100 mmol/L (ref 98–111)
Creatinine, Ser: 0.73 mg/dL (ref 0.44–1.00)
GFR, Estimated: >60 mL/min (ref >60)
Glucose, Bld: 132 mg/dL — ABNORMAL HIGH (ref 70–99)
Potassium: 3.5 mmol/L (ref 3.5–5.1)
Sodium: 138 mmol/L (ref 135–145)
Total Bilirubin: 0.6 mg/dL (ref 0.3–1.2)
Total Protein: 7.4 g/dL (ref 6.5–8.1)

## 2020-12-05 LAB — CBC WITH DIFFERENTIAL/PLATELET
Abs Immature Granulocytes: 0.02 10*3/uL (ref 0.00–0.07)
Basophils Absolute: 0 10*3/uL (ref 0.0–0.1)
Basophils Relative: 0 %
Eosinophils Absolute: 0.2 10*3/uL (ref 0.0–0.5)
Eosinophils Relative: 3 %
HCT: 39.4 % (ref 36.0–46.0)
Hemoglobin: 12.6 g/dL (ref 12.0–15.0)
Immature Granulocytes: 0 %
Lymphocytes Relative: 34 %
Lymphs Abs: 2.5 10*3/uL (ref 0.7–4.0)
MCH: 28.1 pg (ref 26.0–34.0)
MCHC: 32 g/dL (ref 30.0–36.0)
MCV: 87.9 fL (ref 80.0–100.0)
Monocytes Absolute: 0.5 10*3/uL (ref 0.1–1.0)
Monocytes Relative: 7 %
Neutro Abs: 4 10*3/uL (ref 1.7–7.7)
Neutrophils Relative %: 56 %
Platelets: 348 10*3/uL (ref 150–400)
RBC: 4.48 MIL/uL (ref 3.87–5.11)
RDW: 14.1 % (ref 11.5–15.5)
WBC: 7.3 10*3/uL (ref 4.0–10.5)
nRBC: 0 % (ref 0.0–0.2)

## 2020-12-05 NOTE — Progress Notes (Signed)
[place] CONSULT NOTE  Patient Care Team: Marinda Elk, MD as PCP - General (Physician Assistant) Christene Lye, MD (General Surgery)  CHIEF COMPLAINTS/PURPOSE OF CONSULTATION: Breast cancer  #  Oncology History Overview Note  July 2014 -bilateral breast cancer s/p bilateral lumpectomy; s/p bilateral breast radiation of 5040 cGy from 09/07/2013 until 10/14/2013.   # RIGHT IMC-  STAGE I [1.1cm; G-1; ER >90%; PR-NEG; Her- 2 NEG; Oncotype-Low risk RS- 15 (low risk) which corresponded to a 10 year risk of distant recurrence with tamoxifen alone of 9%]  # Left breast - [facility]  revealed 2 foci of 0.6 cm each. Three sentinel lymph nodes were negative.  Tumor was ER positive (90%), PR positive (80%), and HER-2/neu negative.  Patient finished anastrozole x5 years-2019.   BCI testing on 11/13/2018 revealed a 3.7% (95% CI:  1.4% - 5.9%) risk of late recurrence between years 5-10 and a low likelihood of benefit.  DIAGNOSIS: Bil breast cancer  STAGE:  Right-I & Left-I    ;  GOALS: cure  CURRENT/MOST RECENT THERAPY :     Carcinoma of upper-outer quadrant of right breast in female, estrogen receptor positive ([place])  07/13/2013 Initial Diagnosis   Carcinoma of upper-outer quadrant of right breast in female, estrogen receptor positive ([place])   Bilateral breast cancer ([place])  08/10/2013 Initial Diagnosis   Bilateral breast cancer (HCC)      HISTORY OF PRESENTING ILLNESS:  ZAMIRAH DENNY 79 y.o.  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  Patient is currently not on any therapy.  Patient denies any new bone pain.  Denies any new shortness of breath or cough.  Appetite is good.  No weight loss.  No nausea vomiting.   Review of Systems  Constitutional: Negative for chills, diaphoresis, fever, malaise/fatigue and weight loss.  HENT: Negative for nosebleeds and sore throat.   Eyes: Negative for  double vision.  Respiratory: Negative for hemoptysis, sputum production, shortness of breath and wheezing.   Cardiovascular: Negative for chest pain, palpitations, orthopnea and leg swelling.  Gastrointestinal: Negative for abdominal pain, blood in stool, constipation, diarrhea, heartburn, melena, nausea and vomiting.  Genitourinary: Negative for dysuria, frequency and urgency.  Musculoskeletal: Positive for back pain and joint pain.  Skin: Negative.  Negative for itching and rash.  Neurological: Negative for dizziness, tingling, focal weakness, weakness and headaches.  Endo/Heme/Allergies: Does not bruise/bleed easily.  Psychiatric/Behavioral: Negative for depression. The patient is not nervous/anxious and does not have insomnia.      MEDICAL HISTORY:  Past Medical History:  Diagnosis Date   Arthritis    Breast cancer ([place]) 2014   Bilateral- radiation   Breast cyst    Hyperlipidemia    Hypertension    Personal history of radiation therapy 2014   Bilateral lumpectomy    SURGICAL HISTORY: Past Surgical History:  Procedure Laterality Date   ABDOMINAL HYSTERECTOMY  1968   BREAST BIOPSY Bilateral 2014   Bilateral Bx Both Positive c Bilateral Lumpectomy   BREAST CYST ASPIRATION Right 2014   Positive   BREAST LUMPECTOMY Bilateral 2014   f/u radiation    BREAST SURGERY Bilateral July 15 2013   wide excision w sentinel node biopsy   SENTINEL LYMPH NODE BIOPSY Bilateral     SOCIAL HISTORY: Social History   Socioeconomic History   Marital status: Divorced    Spouse name: Not on file   Number of children: Not on file  Years of education: Not on file   Highest education level: Not on file  Occupational History   Not on file  Tobacco Use   Smoking status: Never Smoker   Smokeless tobacco: Never Used  Substance and Sexual Activity   Alcohol use: No   Drug use: No   Sexual activity: Not on file  Other Topics Concern   Not on file  Social History  Narrative   Not on file   Social Determinants of Health   Financial Resource Strain: Not on file  Food Insecurity: Not on file  Transportation Needs: Not on file  Physical Activity: Not on file  Stress: Not on file  Social Connections: Not on file  Intimate Partner Violence: Not on file    FAMILY HISTORY: Family History  Problem Relation Age of Onset   Cancer Mother 13       breast   Breast cancer Mother 35   Ovarian cancer Paternal Grandmother     ALLERGIES:  is allergic to ultram [tramadol].  MEDICATIONS:  Current Outpatient Medications  Medication Sig Dispense Refill   acetaminophen (TYLENOL) 500 MG tablet Take by mouth.     amLODipine (NORVASC) 5 MG tablet Take 1 tablet by mouth daily.     Ascorbic Acid (VITAMIN C) 100 MG tablet Take 100 mg by mouth daily.     Calcium Carb-Cholecalciferol 600-800 MG-UNIT TABS Take 1 tablet by mouth 2 (two) times daily.     fluticasone (FLONASE) 50 MCG/ACT nasal spray Place 1 spray into both nostrils daily.     Multiple Vitamins-Minerals (MULTIVITAMIN WITH MINERALS) tablet Take by mouth.     potassium chloride (K-DUR) 10 MEQ tablet Take 1 tablet (10 mEq total) by mouth daily. (Patient taking differently: Take 10 mEq by mouth 2 (two) times daily.) 7 tablet 0   simvastatin (ZOCOR) 20 MG tablet Take 1 tablet by mouth daily.     valsartan-hydrochlorothiazide (DIOVAN-HCT) 160-12.5 MG tablet Take 1 tablet by mouth daily.     meclizine (ANTIVERT) 25 MG tablet Take 1 tablet (25 mg total) by mouth 3 (three) times daily as needed for dizziness. (Patient not taking: Reported on 12/05/2020) 20 tablet 0   No current facility-administered medications for this visit.      Marland Kitchen  PHYSICAL EXAMINATION: ECOG PERFORMANCE STATUS: 0 - Asymptomatic  Vitals:   12/05/20 1115  BP: (!) 147/73  Pulse: 75  Resp: 20  Temp: 97.6 F (36.4 C)   Filed Weights   12/05/20 1131  Weight: 146 lb (66.2 kg)    Physical Exam HENT:     Head:  Normocephalic and atraumatic.     Mouth/Throat:     Pharynx: No oropharyngeal exudate.  Eyes:     Pupils: Pupils are equal, round, and reactive to light.  Cardiovascular:     Rate and Rhythm: Normal rate and regular rhythm.  Pulmonary:     Effort: No respiratory distress.     Breath sounds: No wheezing.  Abdominal:     General: Bowel sounds are normal. There is no distension.     Palpations: Abdomen is soft. There is no mass.     Tenderness: There is no abdominal tenderness. There is no guarding or rebound.  Musculoskeletal:        General: No tenderness. Normal range of motion.     Cervical back: Normal range of motion and neck supple.  Skin:    General: Skin is warm.  Neurological:     Mental Status: She is alert  and oriented to person, place, and time.  Psychiatric:        Mood and Affect: Affect normal.      LABORATORY DATA:  I have reviewed the data as listed Lab Results  Component Value Date   WBC 7.3 12/05/2020   HGB 12.6 12/05/2020   HCT 39.4 12/05/2020   MCV 87.9 12/05/2020   PLT 348 12/05/2020   Recent Labs    12/05/20 1005  NA 138  K 3.5  CL 100  CO2 25  GLUCOSE 132*  BUN 12  CREATININE 0.73  CALCIUM 9.2  GFRNONAA >60  PROT 7.4  ALBUMIN 3.9  AST 22  ALT 17  ALKPHOS 50  BILITOT 0.6    RADIOGRAPHIC STUDIES: I have personally reviewed the radiological images as listed and agreed with the findings in the report. No results found.  ASSESSMENT & PLAN:   Carcinoma of upper-outer quadrant of right breast in female, estrogen receptor positive ([place]) # Bil breast cancer- Stage I ER/Pos; her 2 NEG- stopped anastrzole x5 years [finished 2019].   #Stable.  Clinically no evidence of recurrence.  Continue surveillance on a yearly basis; mammo- June 2021- WNL.   # Genetics- sister diagnosed with breast cancer at 13y; mother- breast cancer.  Patient has 2 children son  [58]; daughter- 31 [brain tumor-doing well] Patient meets the NCCN criteria for genetic  testing. Discussed the potential implications of a positive test-for herself; and for the rest of the family.  Patient interested in genetics will make a referral..   # OSTEOPENIA- T score=1.6; physically active; ca+vit D BID. Discussed the potential risk factors for osteoporosis- age/gender/postmenopausal status/use of anti-estrogen treatments. Discussed multiple options including exercise/ calcium and vitamin D supplementation/ and also use of bisphosphonates.    # DISPOSITION:  # genetics counselling- re: Bil breast cancer in early 2022 # Follow up in 12 months-  MD; labs- cbc/cmp; bil screeening mamogram in June 2022-Dr.B  All questions were answered. The patient knows to call the clinic with any problems, questions or concerns.    Cammie Sickle, MD 12/12/2020 11:12 PM

## 2020-12-05 NOTE — Assessment & Plan Note (Addendum)
#   Bil breast cancer- Stage I ER/Pos; her 2 NEG- stopped anastrzole x5 years [finished 2019].   #Stable.  Clinically no evidence of recurrence.  Continue surveillance on a yearly basis; mammo- June 2021- WNL.   # Genetics- sister diagnosed with breast cancer at 76y; mother- breast cancer.  Patient has 2 children son  [58]; daughter- 54 [brain tumor-doing well] Patient meets the NCCN criteria for genetic testing. Discussed the potential implications of a positive test-for herself; and for the rest of the family.  Patient interested in genetics will make a referral..   # OSTEOPENIA- T score=1.6; physically active; ca+vit D BID. Discussed the potential risk factors for osteoporosis- age/gender/postmenopausal status/use of anti-estrogen treatments. Discussed multiple options including exercise/ calcium and vitamin D supplementation/ and also use of bisphosphonates.    # DISPOSITION:  # genetics counselling- re: Bil breast cancer in early 2022 # Follow up in 12 months-  MD; labs- cbc/cmp; bil screeening mamogram in June 2022-Dr.B

## 2021-01-17 DIAGNOSIS — H2513 Age-related nuclear cataract, bilateral: Secondary | ICD-10-CM | POA: Diagnosis not present

## 2021-01-17 DIAGNOSIS — E782 Mixed hyperlipidemia: Secondary | ICD-10-CM | POA: Diagnosis not present

## 2021-01-17 DIAGNOSIS — H524 Presbyopia: Secondary | ICD-10-CM | POA: Diagnosis not present

## 2021-01-17 DIAGNOSIS — I10 Essential (primary) hypertension: Secondary | ICD-10-CM | POA: Diagnosis not present

## 2021-01-17 DIAGNOSIS — H401231 Low-tension glaucoma, bilateral, mild stage: Secondary | ICD-10-CM | POA: Diagnosis not present

## 2021-01-17 DIAGNOSIS — H52223 Regular astigmatism, bilateral: Secondary | ICD-10-CM | POA: Diagnosis not present

## 2021-01-17 DIAGNOSIS — H5203 Hypermetropia, bilateral: Secondary | ICD-10-CM | POA: Diagnosis not present

## 2021-01-17 DIAGNOSIS — R7303 Prediabetes: Secondary | ICD-10-CM | POA: Diagnosis not present

## 2021-01-24 DIAGNOSIS — Z780 Asymptomatic menopausal state: Secondary | ICD-10-CM | POA: Diagnosis not present

## 2021-01-24 DIAGNOSIS — I7300 Raynaud's syndrome without gangrene: Secondary | ICD-10-CM | POA: Diagnosis not present

## 2021-01-24 DIAGNOSIS — R7303 Prediabetes: Secondary | ICD-10-CM | POA: Diagnosis not present

## 2021-01-24 DIAGNOSIS — Z853 Personal history of malignant neoplasm of breast: Secondary | ICD-10-CM | POA: Diagnosis not present

## 2021-01-24 DIAGNOSIS — Z0000 Encounter for general adult medical examination without abnormal findings: Secondary | ICD-10-CM | POA: Diagnosis not present

## 2021-01-24 DIAGNOSIS — I10 Essential (primary) hypertension: Secondary | ICD-10-CM | POA: Diagnosis not present

## 2021-01-24 DIAGNOSIS — M8949 Other hypertrophic osteoarthropathy, multiple sites: Secondary | ICD-10-CM | POA: Diagnosis not present

## 2021-01-24 DIAGNOSIS — R202 Paresthesia of skin: Secondary | ICD-10-CM | POA: Diagnosis not present

## 2021-01-24 DIAGNOSIS — E782 Mixed hyperlipidemia: Secondary | ICD-10-CM | POA: Diagnosis not present

## 2021-01-29 DIAGNOSIS — H5203 Hypermetropia, bilateral: Secondary | ICD-10-CM | POA: Diagnosis not present

## 2021-01-29 DIAGNOSIS — H52223 Regular astigmatism, bilateral: Secondary | ICD-10-CM | POA: Diagnosis not present

## 2021-01-29 DIAGNOSIS — H2513 Age-related nuclear cataract, bilateral: Secondary | ICD-10-CM | POA: Diagnosis not present

## 2021-01-29 DIAGNOSIS — H401231 Low-tension glaucoma, bilateral, mild stage: Secondary | ICD-10-CM | POA: Diagnosis not present

## 2021-01-29 DIAGNOSIS — H524 Presbyopia: Secondary | ICD-10-CM | POA: Diagnosis not present

## 2021-02-07 DIAGNOSIS — H5203 Hypermetropia, bilateral: Secondary | ICD-10-CM | POA: Diagnosis not present

## 2021-02-07 DIAGNOSIS — H2513 Age-related nuclear cataract, bilateral: Secondary | ICD-10-CM | POA: Diagnosis not present

## 2021-02-07 DIAGNOSIS — H52223 Regular astigmatism, bilateral: Secondary | ICD-10-CM | POA: Diagnosis not present

## 2021-02-07 DIAGNOSIS — H401231 Low-tension glaucoma, bilateral, mild stage: Secondary | ICD-10-CM | POA: Diagnosis not present

## 2021-02-07 DIAGNOSIS — H524 Presbyopia: Secondary | ICD-10-CM | POA: Diagnosis not present

## 2021-05-24 DIAGNOSIS — H2513 Age-related nuclear cataract, bilateral: Secondary | ICD-10-CM | POA: Diagnosis not present

## 2021-05-24 DIAGNOSIS — H52223 Regular astigmatism, bilateral: Secondary | ICD-10-CM | POA: Diagnosis not present

## 2021-05-24 DIAGNOSIS — H5203 Hypermetropia, bilateral: Secondary | ICD-10-CM | POA: Diagnosis not present

## 2021-05-24 DIAGNOSIS — H524 Presbyopia: Secondary | ICD-10-CM | POA: Diagnosis not present

## 2021-05-24 DIAGNOSIS — H401231 Low-tension glaucoma, bilateral, mild stage: Secondary | ICD-10-CM | POA: Diagnosis not present

## 2021-06-06 ENCOUNTER — Other Ambulatory Visit: Payer: Self-pay

## 2021-06-06 ENCOUNTER — Ambulatory Visit
Admission: RE | Admit: 2021-06-06 | Discharge: 2021-06-06 | Disposition: A | Discharge status: Home / Self Care | Payer: Medicare HMO | Source: Ambulatory Visit | Attending: Internal Medicine | Admitting: Internal Medicine

## 2021-06-06 DIAGNOSIS — C50411 Malignant neoplasm of upper-outer quadrant of right female breast: Secondary | ICD-10-CM | POA: Diagnosis not present

## 2021-06-06 DIAGNOSIS — Z170 Estrogen receptor positive status [ER+]: Secondary | ICD-10-CM | POA: Insufficient documentation

## 2021-06-06 DIAGNOSIS — Z1231 Encounter for screening mammogram for malignant neoplasm of breast: Secondary | ICD-10-CM | POA: Diagnosis not present

## 2021-06-11 DIAGNOSIS — R300 Dysuria: Secondary | ICD-10-CM | POA: Diagnosis not present

## 2021-06-11 DIAGNOSIS — R1032 Left lower quadrant pain: Secondary | ICD-10-CM | POA: Diagnosis not present

## 2021-06-11 DIAGNOSIS — R1031 Right lower quadrant pain: Secondary | ICD-10-CM | POA: Diagnosis not present

## 2021-06-14 ENCOUNTER — Other Ambulatory Visit: Payer: Self-pay | Admitting: Physician Assistant

## 2021-06-14 ENCOUNTER — Ambulatory Visit
Admission: RE | Admit: 2021-06-14 | Discharge: 2021-06-14 | Disposition: A | Discharge status: Home / Self Care | Payer: Medicare HMO | Source: Ambulatory Visit | Attending: Physician Assistant | Admitting: Physician Assistant

## 2021-06-14 ENCOUNTER — Other Ambulatory Visit: Payer: Self-pay

## 2021-06-14 DIAGNOSIS — R1031 Right lower quadrant pain: Secondary | ICD-10-CM | POA: Diagnosis not present

## 2021-06-14 DIAGNOSIS — R1032 Left lower quadrant pain: Secondary | ICD-10-CM | POA: Diagnosis not present

## 2021-06-14 DIAGNOSIS — R109 Unspecified abdominal pain: Secondary | ICD-10-CM | POA: Diagnosis not present

## 2021-06-14 MED ORDER — IOHEXOL 300 MG/ML  SOLN
100.0000 mL | Freq: Once | INTRAMUSCULAR | Status: AC | PRN
  Administered 2021-06-14: 100 mL via INTRAVENOUS

## 2021-06-15 ENCOUNTER — Other Ambulatory Visit: Payer: Self-pay | Admitting: Family Medicine

## 2021-06-15 ENCOUNTER — Other Ambulatory Visit (HOSPITAL_COMMUNITY): Payer: Self-pay | Admitting: Family Medicine

## 2021-06-15 DIAGNOSIS — K769 Liver disease, unspecified: Secondary | ICD-10-CM

## 2021-06-15 DIAGNOSIS — R1031 Right lower quadrant pain: Secondary | ICD-10-CM

## 2021-06-27 ENCOUNTER — Other Ambulatory Visit: Payer: Self-pay

## 2021-06-27 ENCOUNTER — Ambulatory Visit
Admission: RE | Admit: 2021-06-27 | Discharge: 2021-06-27 | Disposition: A | Discharge status: Home / Self Care | Payer: Medicare HMO | Source: Ambulatory Visit | Attending: Family Medicine | Admitting: Family Medicine

## 2021-06-27 DIAGNOSIS — R1031 Right lower quadrant pain: Secondary | ICD-10-CM | POA: Insufficient documentation

## 2021-06-27 DIAGNOSIS — K7689 Other specified diseases of liver: Secondary | ICD-10-CM | POA: Diagnosis not present

## 2021-06-27 DIAGNOSIS — D1803 Hemangioma of intra-abdominal structures: Secondary | ICD-10-CM | POA: Diagnosis not present

## 2021-06-27 DIAGNOSIS — N281 Cyst of kidney, acquired: Secondary | ICD-10-CM | POA: Diagnosis not present

## 2021-06-27 DIAGNOSIS — K769 Liver disease, unspecified: Secondary | ICD-10-CM | POA: Diagnosis not present

## 2021-06-27 DIAGNOSIS — R102 Pelvic and perineal pain: Secondary | ICD-10-CM | POA: Diagnosis not present

## 2021-06-27 MED ORDER — GADOBUTROL 1 MMOL/ML IV SOLN
7.0000 mL | Freq: Once | INTRAVENOUS | Status: AC | PRN
  Administered 2021-06-27: 7 mL via INTRAVENOUS

## 2021-07-18 DIAGNOSIS — E782 Mixed hyperlipidemia: Secondary | ICD-10-CM | POA: Diagnosis not present

## 2021-07-18 DIAGNOSIS — R7303 Prediabetes: Secondary | ICD-10-CM | POA: Diagnosis not present

## 2021-07-18 DIAGNOSIS — Z0000 Encounter for general adult medical examination without abnormal findings: Secondary | ICD-10-CM | POA: Diagnosis not present

## 2021-07-25 DIAGNOSIS — Z170 Estrogen receptor positive status [ER+]: Secondary | ICD-10-CM | POA: Diagnosis not present

## 2021-07-25 DIAGNOSIS — R7303 Prediabetes: Secondary | ICD-10-CM | POA: Diagnosis not present

## 2021-07-25 DIAGNOSIS — E782 Mixed hyperlipidemia: Secondary | ICD-10-CM | POA: Diagnosis not present

## 2021-07-25 DIAGNOSIS — C50911 Malignant neoplasm of unspecified site of right female breast: Secondary | ICD-10-CM | POA: Diagnosis not present

## 2021-07-25 DIAGNOSIS — M159 Polyosteoarthritis, unspecified: Secondary | ICD-10-CM | POA: Diagnosis not present

## 2021-07-25 DIAGNOSIS — Z780 Asymptomatic menopausal state: Secondary | ICD-10-CM | POA: Diagnosis not present

## 2021-07-25 DIAGNOSIS — Z1231 Encounter for screening mammogram for malignant neoplasm of breast: Secondary | ICD-10-CM | POA: Diagnosis not present

## 2021-07-25 DIAGNOSIS — I10 Essential (primary) hypertension: Secondary | ICD-10-CM | POA: Diagnosis not present

## 2021-07-25 DIAGNOSIS — Z0000 Encounter for general adult medical examination without abnormal findings: Secondary | ICD-10-CM | POA: Diagnosis not present

## 2021-08-21 DIAGNOSIS — H401231 Low-tension glaucoma, bilateral, mild stage: Secondary | ICD-10-CM | POA: Diagnosis not present

## 2021-08-21 DIAGNOSIS — H52223 Regular astigmatism, bilateral: Secondary | ICD-10-CM | POA: Diagnosis not present

## 2021-08-21 DIAGNOSIS — H524 Presbyopia: Secondary | ICD-10-CM | POA: Diagnosis not present

## 2021-08-21 DIAGNOSIS — H2513 Age-related nuclear cataract, bilateral: Secondary | ICD-10-CM | POA: Diagnosis not present

## 2021-08-21 DIAGNOSIS — H5203 Hypermetropia, bilateral: Secondary | ICD-10-CM | POA: Diagnosis not present

## 2021-11-27 ENCOUNTER — Other Ambulatory Visit: Payer: Self-pay

## 2021-11-27 ENCOUNTER — Inpatient Hospital Stay: Payer: Medicare HMO | Attending: Internal Medicine

## 2021-11-27 ENCOUNTER — Inpatient Hospital Stay (HOSPITAL_BASED_OUTPATIENT_CLINIC_OR_DEPARTMENT_OTHER): Payer: Medicare HMO | Admitting: Internal Medicine

## 2021-11-27 DIAGNOSIS — Z170 Estrogen receptor positive status [ER+]: Secondary | ICD-10-CM

## 2021-11-27 DIAGNOSIS — Z8041 Family history of malignant neoplasm of ovary: Secondary | ICD-10-CM | POA: Diagnosis not present

## 2021-11-27 DIAGNOSIS — Z923 Personal history of irradiation: Secondary | ICD-10-CM | POA: Insufficient documentation

## 2021-11-27 DIAGNOSIS — M8580 Other specified disorders of bone density and structure, unspecified site: Secondary | ICD-10-CM | POA: Diagnosis not present

## 2021-11-27 DIAGNOSIS — C50411 Malignant neoplasm of upper-outer quadrant of right female breast: Secondary | ICD-10-CM

## 2021-11-27 DIAGNOSIS — K769 Liver disease, unspecified: Secondary | ICD-10-CM | POA: Insufficient documentation

## 2021-11-27 DIAGNOSIS — Z90710 Acquired absence of both cervix and uterus: Secondary | ICD-10-CM | POA: Insufficient documentation

## 2021-11-27 DIAGNOSIS — Z853 Personal history of malignant neoplasm of breast: Secondary | ICD-10-CM | POA: Insufficient documentation

## 2021-11-27 DIAGNOSIS — Z803 Family history of malignant neoplasm of breast: Secondary | ICD-10-CM | POA: Insufficient documentation

## 2021-11-27 LAB — CBC WITH DIFFERENTIAL/PLATELET
Abs Immature Granulocytes: 0.01 10*3/uL (ref 0.00–0.07)
Basophils Absolute: 0 10*3/uL (ref 0.0–0.1)
Basophils Relative: 0 %
Eosinophils Absolute: 0.2 10*3/uL (ref 0.0–0.5)
Eosinophils Relative: 3 %
HCT: 41.3 % (ref 36.0–46.0)
Hemoglobin: 13.3 g/dL (ref 12.0–15.0)
Immature Granulocytes: 0 %
Lymphocytes Relative: 27 %
Lymphs Abs: 1.9 10*3/uL (ref 0.7–4.0)
MCH: 28.4 pg (ref 26.0–34.0)
MCHC: 32.2 g/dL (ref 30.0–36.0)
MCV: 88.1 fL (ref 80.0–100.0)
Monocytes Absolute: 0.6 10*3/uL (ref 0.1–1.0)
Monocytes Relative: 8 %
Neutro Abs: 4.4 10*3/uL (ref 1.7–7.7)
Neutrophils Relative %: 62 %
Platelets: 380 10*3/uL (ref 150–400)
RBC: 4.69 MIL/uL (ref 3.87–5.11)
RDW: 14.2 % (ref 11.5–15.5)
WBC: 7.1 10*3/uL (ref 4.0–10.5)
nRBC: 0 % (ref 0.0–0.2)

## 2021-11-27 LAB — COMPREHENSIVE METABOLIC PANEL
ALT: 16 U/L (ref 0–44)
AST: 19 U/L (ref 15–41)
Albumin: 4 g/dL (ref 3.5–5.0)
Alkaline Phosphatase: 63 U/L (ref 38–126)
Anion gap: 9 (ref 5–15)
BUN: 14 mg/dL (ref 8–23)
CO2: 29 mmol/L (ref 22–32)
Calcium: 9.3 mg/dL (ref 8.9–10.3)
Chloride: 99 mmol/L (ref 98–111)
Creatinine, Ser: 0.86 mg/dL (ref 0.44–1.00)
GFR, Estimated: >60 mL/min (ref >60)
Glucose, Bld: 99 mg/dL (ref 70–99)
Potassium: 3.6 mmol/L (ref 3.5–5.1)
Sodium: 137 mmol/L (ref 135–145)
Total Bilirubin: 0.5 mg/dL (ref 0.3–1.2)
Total Protein: 7.7 g/dL (ref 6.5–8.1)

## 2021-11-27 NOTE — Progress Notes (Signed)
[facility] CONSULT NOTE  Patient Care Team: Marinda Elk, MD as PCP - General (Physician Assistant) Christene Lye, MD (General Surgery)  CHIEF COMPLAINTS/PURPOSE OF CONSULTATION: Breast cancer  #  Oncology History Overview Note  July 2014 -bilateral breast cancer s/p bilateral lumpectomy; s/p bilateral breast radiation of 5040 cGy from 09/07/2013 until 10/14/2013.   # RIGHT IMC-  STAGE I [1.1cm; G-1; ER >90%; PR-NEG; Her- 2 NEG; Oncotype-Low risk RS- 15 (low risk) which corresponded to a 10 year risk of distant recurrence with tamoxifen alone of 9%]  # Left breast - [facility]  revealed 2 foci of 0.6 cm each. Three sentinel lymph nodes were negative.  Tumor was ER positive (90%), PR positive (80%), and HER-2/neu negative.  Patient finished anastrozole x5 years-2019.   BCI testing on 11/13/2018 revealed a 3.7% (95% CI:  1.4% - 5.9%) risk of late recurrence between years 5-10 and a low likelihood of benefit.  DIAGNOSIS: Bil breast cancer  STAGE:  Right-I & Left-I    ;  GOALS: cure  CURRENT/MOST RECENT THERAPY :     Carcinoma of upper-outer quadrant of right breast in female, estrogen receptor positive ([place])  07/13/2013 Initial Diagnosis   Carcinoma of upper-outer quadrant of right breast in female, estrogen receptor positive ([place])   Bilateral breast cancer ([place])  08/10/2013 Initial Diagnosis   Bilateral breast cancer (HCC)      HISTORY OF PRESENTING ILLNESS:  Tina Johnston 80 y.o.  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  Patient is currently not on any therapy.  Denies any new bone pain.  No nausea vomiting headache.  No shortness of breath or cough.  Review of Systems  Constitutional:  Negative for chills, diaphoresis, fever, malaise/fatigue and weight loss.  HENT:  Negative for nosebleeds and sore throat.   Eyes:  Negative for double vision.  Respiratory:  Negative for  hemoptysis, sputum production, shortness of breath and wheezing.   Cardiovascular:  Negative for chest pain, palpitations, orthopnea and leg swelling.  Gastrointestinal:  Negative for abdominal pain, blood in stool, constipation, diarrhea, heartburn, melena, nausea and vomiting.  Genitourinary:  Negative for dysuria, frequency and urgency.  Musculoskeletal:  Positive for back pain and joint pain.  Skin: Negative.  Negative for itching and rash.  Neurological:  Negative for dizziness, tingling, focal weakness, weakness and headaches.  Endo/Heme/Allergies:  Does not bruise/bleed easily.  Psychiatric/Behavioral:  Negative for depression. The patient is not nervous/anxious and does not have insomnia.     MEDICAL HISTORY:  Past Medical History:  Diagnosis Date   Arthritis    Breast cancer ([place]) 2014   Bilateral- radiation   Breast cyst    Hyperlipidemia    Hypertension    Personal history of radiation therapy 2014   Bilateral lumpectomy    SURGICAL HISTORY: Past Surgical History:  Procedure Laterality Date   ABDOMINAL HYSTERECTOMY  1968   BREAST BIOPSY Bilateral 2014   Bilateral Bx Both Positive c Bilateral Lumpectomy   BREAST CYST ASPIRATION Right 2014   Positive   BREAST LUMPECTOMY Bilateral 2014   f/u radiation    BREAST SURGERY Bilateral July 15 2013   wide excision w sentinel node biopsy   SENTINEL LYMPH NODE BIOPSY Bilateral     SOCIAL HISTORY: Social History   Socioeconomic History   Marital status: Divorced    Spouse name: Not on file   Number of children: Not on file  Years of education: Not on file   Highest education level: Not on file  Occupational History   Not on file  Tobacco Use   Smoking status: Never   Smokeless tobacco: Never  Substance and Sexual Activity   Alcohol use: No   Drug use: No   Sexual activity: Not on file  Other Topics Concern   Not on file  Social History Narrative   Not on file   Social Determinants of Health   Financial  Resource Strain: Not on file  Food Insecurity: Not on file  Transportation Needs: Not on file  Physical Activity: Not on file  Stress: Not on file  Social Connections: Not on file  Intimate Partner Violence: Not on file    FAMILY HISTORY: Family History  Problem Relation Age of Onset   Cancer Mother 28       breast   Breast cancer Mother 32   Ovarian cancer Paternal Grandmother     ALLERGIES:  is allergic to ultram [tramadol].  MEDICATIONS:  Current Outpatient Medications  Medication Sig Dispense Refill   acetaminophen (TYLENOL) 500 MG tablet Take by mouth.     amLODipine (NORVASC) 5 MG tablet Take 1 tablet by mouth daily.     Ascorbic Acid (VITAMIN C) 100 MG tablet Take 100 mg by mouth daily.     Calcium Carb-Cholecalciferol 600-800 MG-UNIT TABS Take 1 tablet by mouth 2 (two) times daily.     fluticasone (FLONASE) 50 MCG/ACT nasal spray Place 1 spray into both nostrils daily.     gabapentin (NEURONTIN) 100 MG capsule Take by mouth.     Multiple Vitamins-Minerals (MULTIVITAMIN WITH MINERALS) tablet Take by mouth.     potassium chloride (K-DUR) 10 MEQ tablet Take 1 tablet (10 mEq total) by mouth daily. (Patient taking differently: Take 10 mEq by mouth 2 (two) times daily.) 7 tablet 0   simvastatin (ZOCOR) 20 MG tablet Take 1 tablet by mouth daily.     valsartan-hydrochlorothiazide (DIOVAN-HCT) 160-12.5 MG tablet Take 1 tablet by mouth daily.     meclizine (ANTIVERT) 25 MG tablet Take 1 tablet (25 mg total) by mouth 3 (three) times daily as needed for dizziness. (Patient not taking: Reported on 12/05/2020) 20 tablet 0   No current facility-administered medications for this visit.      Marland Kitchen  PHYSICAL EXAMINATION: ECOG PERFORMANCE STATUS: 0 - Asymptomatic  Vitals:   11/27/21 1029  BP: (!) 156/84  Pulse: 73  Resp: 16  Temp: (!) 97.3 F (36.3 C)   Filed Weights   11/27/21 1029  Weight: 145 lb (65.8 kg)    Physical Exam HENT:     Head: Normocephalic and atraumatic.      Mouth/Throat:     Pharynx: No oropharyngeal exudate.  Eyes:     Pupils: Pupils are equal, round, and reactive to light.  Cardiovascular:     Rate and Rhythm: Normal rate and regular rhythm.  Pulmonary:     Effort: No respiratory distress.     Breath sounds: No wheezing.  Abdominal:     General: Bowel sounds are normal. There is no distension.     Palpations: Abdomen is soft. There is no mass.     Tenderness: There is no abdominal tenderness. There is no guarding or rebound.  Musculoskeletal:        General: No tenderness. Normal range of motion.     Cervical back: Normal range of motion and neck supple.  Skin:    General: Skin is warm.  Neurological:     Mental Status: She is alert and oriented to person, place, and time.  Psychiatric:        Mood and Affect: Affect normal.     LABORATORY DATA:  I have reviewed the data as listed Lab Results  Component Value Date   WBC 7.1 11/27/2021   HGB 13.3 11/27/2021   HCT 41.3 11/27/2021   MCV 88.1 11/27/2021   PLT 380 11/27/2021   Recent Labs    12/05/20 1005 11/27/21 0957  NA 138 137  K 3.5 3.6  CL 100 99  CO2 25 29  GLUCOSE 132* 99  BUN 12 14  CREATININE 0.73 0.86  CALCIUM 9.2 9.3  GFRNONAA >60 >60  PROT 7.4 7.7  ALBUMIN 3.9 4.0  AST 22 19  ALT 17 16  ALKPHOS 50 63  BILITOT 0.6 0.5    RADIOGRAPHIC STUDIES: I have personally reviewed the radiological images as listed and agreed with the findings in the report. No results found.  ASSESSMENT & PLAN:   Carcinoma of upper-outer quadrant of right breast in female, estrogen receptor positive ([place]) # Bil breast cancer- Stage I ER/Pos; her 2 NEG- stopped anastrzole x5 years [finished 2019].   # STABLE;  Clinically no evidence of recurrence.  Continue surveillance on a yearly basis; mammo- June 2022- WNL.   # Liver lesions- incidental [CT/MRi July 2022]- awaiting repeat MRI in Jan 2023/PCP.   # Genetics- sister diagnosed with breast cancer at 72y; mother- breast  cancer.  Patient has 2 children son  [58]; daughter- 57 [brain tumor-doing well] Patient meets the NCCN criteria for genetic testing. Discussed the potential implications of a positive test-for herself; and for the rest of the family.  We will make a referral to genetics.  # OSTEOPENIA-July 2020- T score=1.6; physically active; ca+vit D BID. Discussed the potential risk factors for osteoporosis- age/gender/postmenopausal status/use of anti-estrogen treatments. Discussed multiple options including exercise/ calcium and vitamin D supplementation/ and also use of bisphosphonates.  We will repeat mammogram next visit.  # DISPOSITION:  # genetics counselling- re: Bil breast cancer- Second referral  # Follow up in 12 months-  MD; labs- cbc/cmp; bil screeening mamogram/Dexa scan in June 2023-Dr.B  All questions were answered. The patient knows to call the clinic with any problems, questions or concerns.    Cammie Sickle, MD 11/27/2021 1:07 PM

## 2021-11-27 NOTE — Assessment & Plan Note (Signed)
#   Bil breast cancer- Stage I ER/Pos; her 2 NEG- stopped anastrzole x5 years [finished 2019].   # STABLE;  Clinically no evidence of recurrence.  Continue surveillance on a yearly basis; mammo- June 2022- WNL.   # Liver lesions- incidental [CT/MRi July 2022]- awaiting repeat MRI in Jan 2023/PCP.   # Genetics- sister diagnosed with breast cancer at 62y; mother- breast cancer.  Patient has 2 children son  [58]; daughter- 63 [brain tumor-doing well] Patient meets the NCCN criteria for genetic testing. Discussed the potential implications of a positive test-for herself; and for the rest of the family.  We will make a referral to genetics.  # OSTEOPENIA-July 2020- T score=1.6; physically active; ca+vit D BID. Discussed the potential risk factors for osteoporosis- age/gender/postmenopausal status/use of anti-estrogen treatments. Discussed multiple options including exercise/ calcium and vitamin D supplementation/ and also use of bisphosphonates.  We will repeat mammogram next visit.  # DISPOSITION:  # genetics counselling- re: Bil breast cancer- Second referral  # Follow up in 12 months-  MD; labs- cbc/cmp; bil screeening mamogram/Dexa scan in June 2023-Dr.B

## 2021-11-27 NOTE — Progress Notes (Signed)
Patient has noticed episodes of feeling that she can't think or feels "blank" that started this summer.  Numbness feeling in left hand for the past year and now occasional in right hand.  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

## 2021-11-30 DIAGNOSIS — H524 Presbyopia: Secondary | ICD-10-CM | POA: Diagnosis not present

## 2021-11-30 DIAGNOSIS — H2513 Age-related nuclear cataract, bilateral: Secondary | ICD-10-CM | POA: Diagnosis not present

## 2021-11-30 DIAGNOSIS — H52223 Regular astigmatism, bilateral: Secondary | ICD-10-CM | POA: Diagnosis not present

## 2021-11-30 DIAGNOSIS — H5203 Hypermetropia, bilateral: Secondary | ICD-10-CM | POA: Diagnosis not present

## 2021-11-30 DIAGNOSIS — H401231 Low-tension glaucoma, bilateral, mild stage: Secondary | ICD-10-CM | POA: Diagnosis not present

## 2021-12-06 ENCOUNTER — Encounter: Payer: Self-pay | Admitting: Licensed Clinical Social Worker

## 2021-12-06 ENCOUNTER — Other Ambulatory Visit: Payer: Self-pay

## 2021-12-06 ENCOUNTER — Inpatient Hospital Stay (HOSPITAL_BASED_OUTPATIENT_CLINIC_OR_DEPARTMENT_OTHER): Payer: Medicare HMO | Admitting: Licensed Clinical Social Worker

## 2021-12-06 ENCOUNTER — Inpatient Hospital Stay: Payer: Medicare HMO

## 2021-12-06 DIAGNOSIS — Z803 Family history of malignant neoplasm of breast: Secondary | ICD-10-CM | POA: Diagnosis not present

## 2021-12-06 DIAGNOSIS — Z801 Family history of malignant neoplasm of trachea, bronchus and lung: Secondary | ICD-10-CM | POA: Insufficient documentation

## 2021-12-06 DIAGNOSIS — Z8042 Family history of malignant neoplasm of prostate: Secondary | ICD-10-CM | POA: Diagnosis not present

## 2021-12-06 DIAGNOSIS — Z853 Personal history of malignant neoplasm of breast: Secondary | ICD-10-CM | POA: Diagnosis not present

## 2021-12-06 DIAGNOSIS — Z8041 Family history of malignant neoplasm of ovary: Secondary | ICD-10-CM

## 2021-12-06 NOTE — Progress Notes (Signed)
REFERRING PROVIDER: Cammie Sickle, MD [place],  [place] [address]  PRIMARY PROVIDER:  Marinda Elk, MD  PRIMARY REASON FOR VISIT:  1. Personal history of breast cancer   2. Family history of breast cancer   3. Family history of ovarian cancer   4. Family history of prostate cancer   5. Family history of lung cancer      HISTORY OF PRESENT ILLNESS:   Ms. Delgrande, a 80 y.o. female, was seen for a [place] cancer genetics consultation at the request of Dr. Rogue Bussing due to a personal and family history of cancer.  Ms. Asmus presents to clinic today to discuss the possibility of a hereditary predisposition to cancer, genetic testing, and to further clarify her future cancer risks, as well as potential cancer risks for family members.   In 2014, at the age of 15, Ms. Fernandez was diagnosed with bilateral breast cancer. This was treated with bilateral lumpectomy, radiation and anastrozole.   CANCER HISTORY:  Oncology History Overview Note  July 2014 -bilateral breast cancer s/p bilateral lumpectomy; s/p bilateral breast radiation of 5040 cGy from 09/07/2013 until 10/14/2013.   # RIGHT IMC-  STAGE I [1.1cm; G-1; ER >90%; PR-NEG; Her- 2 NEG; Oncotype-Low risk RS- 15 (low risk) which corresponded to a 10 year risk of distant recurrence with tamoxifen alone of 9%]  # Left breast - [facility]  revealed 2 foci of 0.6 cm each. Three sentinel lymph nodes were negative.  Tumor was ER positive (90%), PR positive (80%), and HER-2/neu negative.  Patient finished anastrozole x5 years-2019.   BCI testing on 11/13/2018 revealed a 3.7% (95% CI:  1.4% - 5.9%) risk of late recurrence between years 5-10 and a low likelihood of benefit.  DIAGNOSIS: Bil breast cancer  STAGE:  Right-I & Left-I    ;  GOALS: cure  CURRENT/MOST RECENT THERAPY :     Carcinoma of upper-outer quadrant of right breast in female, estrogen receptor positive ([place])  07/13/2013 Initial Diagnosis   Carcinoma  of upper-outer quadrant of right breast in female, estrogen receptor positive ([place])   Bilateral breast cancer ([place])  08/10/2013 Initial Diagnosis   Bilateral breast cancer ([place])      RISK FACTORS:  Menarche was at age 8-14.  First live birth at age 1.  OCP use for approximately 5 years.  Ovaries intact: yes.  Hysterectomy: yes.  Menopausal status: postmenopausal.  HRT use: 0 years. Colonoscopy: yes; normal.  Past Medical History:  Diagnosis Date   Arthritis    Breast cancer ([place]) 2014   Bilateral- radiation   Breast cyst    Family history of breast cancer    Family history of lung cancer    Family history of ovarian cancer    Family history of prostate cancer    Hyperlipidemia    Hypertension    Personal history of breast cancer    Personal history of radiation therapy 2014   Bilateral lumpectomy    Past Surgical History:  Procedure Laterality Date   ABDOMINAL HYSTERECTOMY  1968   BREAST BIOPSY Bilateral 2014   Bilateral Bx Both Positive c Bilateral Lumpectomy   BREAST CYST ASPIRATION Right 2014   Positive   BREAST LUMPECTOMY Bilateral 2014   f/u radiation    BREAST SURGERY Bilateral July 15 2013   wide excision w sentinel node biopsy   SENTINEL LYMPH NODE BIOPSY Bilateral     Social History   Socioeconomic History   Marital status: Divorced  Spouse name: Not on file   Number of children: Not on file   Years of education: Not on file   Highest education level: Not on file  Occupational History   Not on file  Tobacco Use   Smoking status: Never   Smokeless tobacco: Never  Substance and Sexual Activity   Alcohol use: No   Drug use: No   Sexual activity: Not on file  Other Topics Concern   Not on file  Social History Narrative   Not on file   Social Determinants of Health   Financial Resource Strain: Not on file  Food Insecurity: Not on file  Transportation Needs: Not on file  Physical Activity: Not on file  Stress: Not on file  Social  Connections: Not on file     FAMILY HISTORY:  We obtained a detailed, 4-generation family history.  Significant diagnoses are listed below: Family History  Problem Relation Age of Onset   Breast cancer Mother        dx late 2s, d. 36   Lung cancer Father        d. 70   Breast cancer Sister        dx 37   Breast cancer Sister        dx 73   Prostate cancer Brother        d. 66   Ovarian cancer Paternal Grandmother        d. 96   Lung cancer Paternal Grandfather        d. 90   Other Daughter        brain tumor, unk type   Ms. Halliwell has 1 daughter (74) who has a brain tumor, unknown type, and 1 son (76), no history of cancer. Ms. Harbeck also has 4 sisters and 5 brothers. One sister had breast cancer at 6 and is living at 62, another sister had breast cancer at 47 and is living at 72. The younger sister may have had genetic testing, unknown results. A brother recently died of prostate cancer at 60.   Ms. Dreese mother had breast cancer in her late 29s and died at 19. Patient had 5 maternal uncles, 1 maternal aunt, and a half uncle and half aunt through her grandfather. None had cancer that she is aware of, no cancers in cousins. Maternal grandmother died in childbirth, unknown age/cause of death for grandfather.  Ms. Brocker father died at 55 and he had lung cancer and history of smoking. Patient had 1 paternal aunt and uncle, no known cancers, and several half aunts/uncles through her grandfather, limited information, no known cancers for them or for cousins. Paternal grandmother had ovarian cancer at 80 and died at 77, grandfather had lung cancer and died at 109, history of smoking.   Ms. Buechele is unaware of previous family history of genetic testing for hereditary cancer risks. Patient's maternal ancestors are of Black descent, and paternal ancestors are of Black descent. There is no reported Ashkenazi Jewish ancestry. There is no known consanguinity.     GENETIC COUNSELING  ASSESSMENT: Ms. Smither is a 80 y.o. female with a personal and family history of cancer which is somewhat suggestive of a hereditary cancer syndrome and predisposition to cancer. We, therefore, discussed and recommended the following at today's visit.   DISCUSSION: We discussed that approximately 10% of breast cancer is hereditary. Most cases of hereditary breast cancer are associated with BRCA1/BRCA2 genes, although there are other genes associated with hereditary breast/ovarian/prostate cancer  as well. Cancers and risks are gene specific.  We discussed that testing is beneficial for several reasons including  knowing about other cancer risks, identifying potential screening and risk-reduction options that may be appropriate, and to understand if other family members could be at risk for cancer and allow them to undergo genetic testing.   We reviewed the characteristics, features and inheritance patterns of hereditary cancer syndromes. We also discussed genetic testing, including the appropriate family members to test, the process of testing, insurance coverage and turn-around-time for results. We discussed the implications of a negative, positive and/or variant of uncertain significant result. We recommended Ms. Eskew pursue genetic testing for the [facility] Multi-Cancer+RNA gene panel.   Based on Ms. Dobkins's personal and family history of cancer, she meets medical criteria for genetic testing. Despite that she meets criteria, she may still have an out of pocket cost. We discussed that if her out of pocket cost for testing is over $100, the laboratory will call and confirm whether she wants to proceed with testing.  If the out of pocket cost of testing is less than $100 she will be billed by the genetic testing laboratory.   PLAN: After considering the risks, benefits, and limitations, Ms. Branscum did not wish to pursue genetic testing at today's visit. She would like a billing investigation to be done to  confirm the cost of testing. We will reach out once we hear back from the lab.  Ms. Crothers questions were answered to her satisfaction today. Our contact information was provided should additional questions or concerns arise. Thank you for the referral and allowing [place] to share in the care of your patient.   Faith Rogue, MS, [facility] Genetic Counselor [email or web address] Phone: [phone]  The patient was seen for a total of 40 minutes in face-to-face genetic counseling.  Patient was seen alone.  Dr. Grayland Ormond was available for discussion regarding this case.   _______________________________________________________________________ For Office Staff:  Number of people involved in session: 1 Was an Intern/ student involved with case: no

## 2021-12-11 ENCOUNTER — Telehealth: Payer: Self-pay | Admitting: Licensed Clinical Social Worker

## 2021-12-11 NOTE — Telephone Encounter (Signed)
Called Tina Johnston to let her know we could do testing through [facility] at no cost. She would like to do this and we have her scheduled to come in 12/27/21 and 9 am to have blood drawn.

## 2021-12-27 ENCOUNTER — Other Ambulatory Visit: Payer: Self-pay

## 2021-12-27 ENCOUNTER — Inpatient Hospital Stay: Payer: Medicare HMO | Attending: Internal Medicine

## 2022-01-15 ENCOUNTER — Telehealth: Payer: Self-pay | Admitting: Licensed Clinical Social Worker

## 2022-01-15 ENCOUNTER — Ambulatory Visit: Payer: Self-pay | Admitting: Licensed Clinical Social Worker

## 2022-01-15 ENCOUNTER — Encounter: Payer: Self-pay | Admitting: Licensed Clinical Social Worker

## 2022-01-15 DIAGNOSIS — Z1379 Encounter for other screening for genetic and chromosomal anomalies: Secondary | ICD-10-CM | POA: Insufficient documentation

## 2022-01-15 NOTE — Progress Notes (Signed)
HPI:  Tina Johnston was previously seen in the [place] clinic due to a personal and family history of cancer and concerns regarding a hereditary predisposition to cancer. Please refer to our prior cancer genetics clinic note for more information regarding our discussion, assessment and recommendations, at the time. Tina Johnston recent genetic test results were disclosed to her, as were recommendations warranted by these results. These results and recommendations are discussed in more detail below.  CANCER HISTORY:  Oncology History Overview Note  July 2014 -bilateral breast cancer s/p bilateral lumpectomy; s/p bilateral breast radiation of 5040 cGy from 09/07/2013 until 10/14/2013.   # RIGHT IMC-  STAGE I [1.1cm; G-1; ER >90%; PR-NEG; Her- 2 NEG; Oncotype-Low risk RS- 15 (low risk) which corresponded to a 10 year risk of distant recurrence with tamoxifen alone of 9%]  # Left breast - [facility]  revealed 2 foci of 0.6 cm each. Three sentinel lymph nodes were negative.  Tumor was ER positive (90%), PR positive (80%), and HER-2/neu negative.  Patient finished anastrozole x5 years-2019.   BCI testing on 11/13/2018 revealed a 3.7% (95% CI:  1.4% - 5.9%) risk of late recurrence between years 5-10 and a low likelihood of benefit.  DIAGNOSIS: Bil breast cancer  STAGE:  Right-I & Left-I    ;  GOALS: cure  CURRENT/MOST RECENT THERAPY :     Carcinoma of upper-outer quadrant of right breast in female, estrogen receptor positive ([place])  07/13/2013 Initial Diagnosis   Carcinoma of upper-outer quadrant of right breast in female, estrogen receptor positive ([place])    Genetic Testing   Negative genetic testing. No pathogenic variants identified on the CancerNext-Expanded+RNA panel. VUS in AIP called c.695C>T and VUS in POT1 called c.1562A>G identified. The report date is 01/10/2022.  The CancerNext-Expanded + RNAinsight gene panel offered by [employer] and includes sequencing and rearrangement  analysis for the following 77 genes: IP, ALK, APC*, ATM*, AXIN2, BAP1, BARD1, BLM, BMPR1A, BRCA1*, BRCA2*, BRIP1*, CDC73, CDH1*,CDK4, CDKN1B, CDKN2A, CHEK2*, CTNNA1, DICER1, FANCC, FH, FLCN, GALNT12, KIF1B, LZTR1, MAX, MEN1, MET, MLH1*, MSH2*, MSH3, MSH6*, MUTYH*, NBN, NF1*, NF2, NTHL1, PALB2*, PHOX2B, PMS2*, POT1, PRKAR1A, PTCH1, PTEN*, RAD51C*, RAD51D*,RB1, RECQL, RET, SDHA, SDHAF2, SDHB, SDHC, SDHD, SMAD4, SMARCA4, SMARCB1, SMARCE1, STK11, SUFU, TMEM127, TP53*,TSC1, TSC2, VHL and XRCC2 (sequencing and deletion/duplication); EGFR, EGLN1, HOXB13, KIT, MITF, PDGFRA, POLD1 and POLE (sequencing only); EPCAM and GREM1 (deletion/duplication only).   Bilateral breast cancer ([place])  08/10/2013 Initial Diagnosis   Bilateral breast cancer ([place])     FAMILY HISTORY:  We obtained a detailed, 4-generation family history.  Significant diagnoses are listed below: Family History  Problem Relation Age of Onset   Breast cancer Mother        dx late 2s, d. 53   Lung cancer Father        d. 71   Breast cancer Sister        dx 37   Breast cancer Sister        dx 35   Prostate cancer Brother        d. 67   Ovarian cancer Paternal Grandmother        d. 94   Lung cancer Paternal Grandfather        d. 39   Other Daughter        brain tumor, unk type   Tina Johnston has 1 daughter (50) who has a brain tumor, unknown type, and 1 son (60), no history of cancer. Tina Johnston also has  4 sisters and 5 brothers. One sister had breast cancer at 75 and is living at 58, another sister had breast cancer at 70 and is living at 38. The younger sister may have had genetic testing, unknown results. A brother recently died of prostate cancer at 25.    Tina Johnston mother had breast cancer in her late 56s and died at 74. Patient had 5 maternal uncles, 1 maternal aunt, and a half uncle and half aunt through her grandfather. None had cancer that she is aware of, no cancers in cousins. Maternal grandmother died in childbirth, unknown  age/cause of death for grandfather.   Tina Johnston father died at 66 and he had lung cancer and history of smoking. Patient had 1 paternal aunt and uncle, no known cancers, and several half aunts/uncles through her grandfather, limited information, no known cancers for them or for cousins. Paternal grandmother had ovarian cancer at 42 and died at 110, grandfather had lung cancer and died at 54, history of smoking.    Tina Johnston is unaware of previous family history of genetic testing for hereditary cancer risks. Patient's maternal ancestors are of Black descent, and paternal ancestors are of Black descent. There is no reported Ashkenazi Jewish ancestry. There is no known consanguinity.      GENETIC TEST RESULTS: Genetic testing reported out on 01/10/2022 through the [employer] CancerNext-Expanded+RNA cancer panel found no pathogenic mutations.   The CancerNext-Expanded + RNAinsight gene panel offered by [employer] and includes sequencing and rearrangement analysis for the following 77 genes: IP, ALK, APC*, ATM*, AXIN2, BAP1, BARD1, BLM, BMPR1A, BRCA1*, BRCA2*, BRIP1*, CDC73, CDH1*,CDK4, CDKN1B, CDKN2A, CHEK2*, CTNNA1, DICER1, FANCC, FH, FLCN, GALNT12, KIF1B, LZTR1, MAX, MEN1, MET, MLH1*, MSH2*, MSH3, MSH6*, MUTYH*, NBN, NF1*, NF2, NTHL1, PALB2*, PHOX2B, PMS2*, POT1, PRKAR1A, PTCH1, PTEN*, RAD51C*, RAD51D*,RB1, RECQL, RET, SDHA, SDHAF2, SDHB, SDHC, SDHD, SMAD4, SMARCA4, SMARCB1, SMARCE1, STK11, SUFU, TMEM127, TP53*,TSC1, TSC2, VHL and XRCC2 (sequencing and deletion/duplication); EGFR, EGLN1, HOXB13, KIT, MITF, PDGFRA, POLD1 and POLE (sequencing only); EPCAM and GREM1 (deletion/duplication only).   The test report has been scanned into EPIC and is located under the Molecular Pathology section of the Results Review tab.  A portion of the result report is included below for reference.     We discussed that because current genetic testing is not perfect, it is possible there may be a gene mutation in one of  these genes that current testing cannot detect, but that chance is small.  There could be another gene that has not yet been discovered, or that we have not yet tested, that is responsible for the cancer diagnoses in the family. It is also possible there is a hereditary cause for the cancer in the family that Tina Johnston did not inherit and therefore was not identified in her testing.  Therefore, it is important to remain in touch with cancer genetics in the future so that we can continue to offer Tina Johnston the most up to date genetic testing.   Genetic testing did identify 2 Variants of uncertain significance (VUS) - one in the AIP gene called c.695C>T, a second in the POT1 gene called c.1562A>G. At this time, it is unknown if these variants are associated with increased cancer risk or if they are normal findings, but most variants such as these get reclassified to being inconsequential. They should not be used to make medical management decisions. With time, we suspect the lab will determine the significance of these variants, if any. If we do learn  more about them, we will try to contact Tina Johnston to discuss it further. However, it is important to stay in touch with [place] periodically and keep the address and phone number up to date.  ADDITIONAL GENETIC TESTING: We discussed with Tina Johnston that her genetic testing was fairly extensive.  If there are genes identified to increase cancer risk that can be analyzed in the future, we would be happy to discuss and coordinate this testing at that time.    CANCER SCREENING RECOMMENDATIONS: Tina Johnston test result is considered negative (normal).  This means that we have not identified a hereditary cause for her  personal and family history of cancer at this time. Most cancers happen by chance and this negative test suggests that her cancer may fall into this category.    While reassuring, this does not definitively rule out a hereditary predisposition to cancer.  It is still possible that there could be genetic mutations that are undetectable by current technology. There could be genetic mutations in genes that have not been tested or identified to increase cancer risk.  Therefore, it is recommended she continue to follow the cancer management and screening guidelines provided by her oncology and primary healthcare provider.   An individual's cancer risk and medical management are not determined by genetic test results alone. Overall cancer risk assessment incorporates additional factors, including personal medical history, family history, and any available genetic information that may result in a personalized plan for cancer prevention and surveillance.  RECOMMENDATIONS FOR FAMILY MEMBERS:  Relatives in this family might be at some increased risk of developing cancer, over the general population risk, simply due to the family history of cancer.  We recommended female relatives in this family have a yearly mammogram beginning at age 84, or 51 years younger than the earliest onset of cancer, an annual clinical breast exam, and perform monthly breast self-exams. Female relatives in this family should also have a gynecological exam as recommended by their primary provider.  All family members should be referred for colonoscopy starting at age 50.    It is also possible there is a hereditary cause for the cancer in Tina Johnston's family that she did not inherit and therefore was not identified in her.  Based on Tina Johnston's family history, we recommended paternal relatives/those who have cancer (such as her sisters)  have genetic counseling and testing. Tina Johnston will let us know if we can be of any assistance in coordinating genetic counseling and/or testing for these family members.  FOLLOW-UP: Lastly, we discussed with Tina Johnston that cancer genetics is a rapidly advancing field and it is possible that new genetic tests will be appropriate for her and/or her family  members in the future. We encouraged her to remain in contact with cancer genetics on an annual basis so we can update her personal and family histories and let her know of advances in cancer genetics that may benefit this family.   Our contact number was provided. Tina Johnston questions were answered to her satisfaction, and she knows she is welcome to call us at anytime with additional questions or concerns.   Faith Rogue, MS, [facility] Genetic Counselor [email or web address] Phone: [phone]

## 2022-01-15 NOTE — Telephone Encounter (Signed)
Revealed negative genetic testing.  Revealed that a VUS in AIP and POT1 was identified. This normal result is reassuring and indicates that it is unlikely Tina Johnston's cancer is due to a hereditary cause.  It is unlikely that there is an increased risk of another cancer due to a mutation in one of these genes.  However, genetic testing is not perfect, and cannot definitively rule out a hereditary cause.  It will be important for her to keep in contact with genetics to learn if any additional testing may be needed in the future.

## 2022-01-21 DIAGNOSIS — I10 Essential (primary) hypertension: Secondary | ICD-10-CM | POA: Diagnosis not present

## 2022-01-21 DIAGNOSIS — E782 Mixed hyperlipidemia: Secondary | ICD-10-CM | POA: Diagnosis not present

## 2022-01-21 DIAGNOSIS — R7303 Prediabetes: Secondary | ICD-10-CM | POA: Diagnosis not present

## 2022-01-28 DIAGNOSIS — R202 Paresthesia of skin: Secondary | ICD-10-CM | POA: Diagnosis not present

## 2022-01-28 DIAGNOSIS — R7303 Prediabetes: Secondary | ICD-10-CM | POA: Diagnosis not present

## 2022-01-28 DIAGNOSIS — Z0000 Encounter for general adult medical examination without abnormal findings: Secondary | ICD-10-CM | POA: Diagnosis not present

## 2022-01-28 DIAGNOSIS — I10 Essential (primary) hypertension: Secondary | ICD-10-CM | POA: Diagnosis not present

## 2022-01-28 DIAGNOSIS — R413 Other amnesia: Secondary | ICD-10-CM | POA: Diagnosis not present

## 2022-01-28 DIAGNOSIS — M159 Polyosteoarthritis, unspecified: Secondary | ICD-10-CM | POA: Diagnosis not present

## 2022-01-28 DIAGNOSIS — R42 Dizziness and giddiness: Secondary | ICD-10-CM | POA: Diagnosis not present

## 2022-01-28 DIAGNOSIS — E782 Mixed hyperlipidemia: Secondary | ICD-10-CM | POA: Diagnosis not present

## 2022-01-28 DIAGNOSIS — C50911 Malignant neoplasm of unspecified site of right female breast: Secondary | ICD-10-CM | POA: Diagnosis not present

## 2022-01-29 ENCOUNTER — Other Ambulatory Visit: Payer: Self-pay | Admitting: Physician Assistant

## 2022-01-29 DIAGNOSIS — R42 Dizziness and giddiness: Secondary | ICD-10-CM

## 2022-01-29 DIAGNOSIS — R413 Other amnesia: Secondary | ICD-10-CM

## 2022-01-30 DIAGNOSIS — R399 Unspecified symptoms and signs involving the genitourinary system: Secondary | ICD-10-CM | POA: Diagnosis not present

## 2022-02-07 ENCOUNTER — Encounter: Payer: Self-pay | Admitting: Internal Medicine

## 2022-02-08 ENCOUNTER — Ambulatory Visit: Payer: Medicare HMO

## 2022-02-20 DIAGNOSIS — L298 Other pruritus: Secondary | ICD-10-CM | POA: Diagnosis not present

## 2022-02-20 DIAGNOSIS — L821 Other seborrheic keratosis: Secondary | ICD-10-CM | POA: Diagnosis not present

## 2022-02-26 ENCOUNTER — Encounter: Payer: Self-pay | Admitting: Internal Medicine

## 2022-02-28 ENCOUNTER — Ambulatory Visit
Admission: RE | Admit: 2022-02-28 | Discharge: 2022-02-28 | Disposition: A | Discharge status: Home / Self Care | Payer: Medicare HMO | Source: Ambulatory Visit | Attending: Physician Assistant | Admitting: Physician Assistant

## 2022-02-28 DIAGNOSIS — R42 Dizziness and giddiness: Secondary | ICD-10-CM | POA: Diagnosis not present

## 2022-02-28 DIAGNOSIS — R413 Other amnesia: Secondary | ICD-10-CM | POA: Insufficient documentation

## 2022-02-28 DIAGNOSIS — R519 Headache, unspecified: Secondary | ICD-10-CM | POA: Diagnosis not present

## 2022-03-07 DIAGNOSIS — H52223 Regular astigmatism, bilateral: Secondary | ICD-10-CM | POA: Diagnosis not present

## 2022-03-07 DIAGNOSIS — H5203 Hypermetropia, bilateral: Secondary | ICD-10-CM | POA: Diagnosis not present

## 2022-03-07 DIAGNOSIS — H401231 Low-tension glaucoma, bilateral, mild stage: Secondary | ICD-10-CM | POA: Diagnosis not present

## 2022-03-07 DIAGNOSIS — H2513 Age-related nuclear cataract, bilateral: Secondary | ICD-10-CM | POA: Diagnosis not present

## 2022-03-07 DIAGNOSIS — H524 Presbyopia: Secondary | ICD-10-CM | POA: Diagnosis not present

## 2022-04-09 DIAGNOSIS — E559 Vitamin D deficiency, unspecified: Secondary | ICD-10-CM | POA: Diagnosis not present

## 2022-04-09 DIAGNOSIS — R42 Dizziness and giddiness: Secondary | ICD-10-CM | POA: Diagnosis not present

## 2022-04-09 DIAGNOSIS — Z113 Encounter for screening for infections with a predominantly sexual mode of transmission: Secondary | ICD-10-CM | POA: Diagnosis not present

## 2022-04-09 DIAGNOSIS — G3184 Mild cognitive impairment, so stated: Secondary | ICD-10-CM | POA: Diagnosis not present

## 2022-04-09 DIAGNOSIS — R9082 White matter disease, unspecified: Secondary | ICD-10-CM | POA: Diagnosis not present

## 2022-04-09 DIAGNOSIS — Z87898 Personal history of other specified conditions: Secondary | ICD-10-CM | POA: Diagnosis not present

## 2022-04-09 DIAGNOSIS — R413 Other amnesia: Secondary | ICD-10-CM | POA: Diagnosis not present

## 2022-04-25 DIAGNOSIS — R413 Other amnesia: Secondary | ICD-10-CM | POA: Diagnosis not present

## 2022-04-25 DIAGNOSIS — R42 Dizziness and giddiness: Secondary | ICD-10-CM | POA: Diagnosis not present

## 2022-04-25 DIAGNOSIS — G3184 Mild cognitive impairment, so stated: Secondary | ICD-10-CM | POA: Diagnosis not present

## 2022-05-09 DIAGNOSIS — D3132 Benign neoplasm of left choroid: Secondary | ICD-10-CM | POA: Diagnosis not present

## 2022-05-09 DIAGNOSIS — H40003 Preglaucoma, unspecified, bilateral: Secondary | ICD-10-CM | POA: Diagnosis not present

## 2022-05-09 DIAGNOSIS — H2513 Age-related nuclear cataract, bilateral: Secondary | ICD-10-CM | POA: Diagnosis not present

## 2022-05-09 DIAGNOSIS — Z0100 Encounter for examination of eyes and vision without abnormal findings: Secondary | ICD-10-CM | POA: Diagnosis not present

## 2022-05-12 ENCOUNTER — Encounter: Payer: Self-pay | Admitting: Emergency Medicine

## 2022-05-12 ENCOUNTER — Encounter: Payer: Self-pay | Admitting: Internal Medicine

## 2022-05-12 ENCOUNTER — Emergency Department
Admission: EM | Admit: 2022-05-12 | Discharge: 2022-05-12 | Disposition: A | Discharge status: Home / Self Care | Payer: Medicare HMO | Attending: Emergency Medicine | Admitting: Emergency Medicine

## 2022-05-12 ENCOUNTER — Other Ambulatory Visit: Payer: Self-pay

## 2022-05-12 ENCOUNTER — Emergency Department: Payer: Medicare HMO

## 2022-05-12 DIAGNOSIS — R1031 Right lower quadrant pain: Secondary | ICD-10-CM | POA: Diagnosis present

## 2022-05-12 DIAGNOSIS — K5792 Diverticulitis of intestine, part unspecified, without perforation or abscess without bleeding: Secondary | ICD-10-CM

## 2022-05-12 DIAGNOSIS — I10 Essential (primary) hypertension: Secondary | ICD-10-CM | POA: Insufficient documentation

## 2022-05-12 DIAGNOSIS — K5732 Diverticulitis of large intestine without perforation or abscess without bleeding: Secondary | ICD-10-CM | POA: Insufficient documentation

## 2022-05-12 DIAGNOSIS — K6389 Other specified diseases of intestine: Secondary | ICD-10-CM | POA: Diagnosis not present

## 2022-05-12 DIAGNOSIS — K5730 Diverticulosis of large intestine without perforation or abscess without bleeding: Secondary | ICD-10-CM | POA: Diagnosis not present

## 2022-05-12 DIAGNOSIS — Z853 Personal history of malignant neoplasm of breast: Secondary | ICD-10-CM | POA: Diagnosis not present

## 2022-05-12 LAB — CBC
HCT: 42.3 % (ref 36.0–46.0)
Hemoglobin: 13.2 g/dL (ref 12.0–15.0)
MCH: 27.5 pg (ref 26.0–34.0)
MCHC: 31.2 g/dL (ref 30.0–36.0)
MCV: 88.1 fL (ref 80.0–100.0)
Platelets: 426 10*3/uL — ABNORMAL HIGH (ref 150–400)
RBC: 4.8 MIL/uL (ref 3.87–5.11)
RDW: 14 % (ref 11.5–15.5)
WBC: 10.2 10*3/uL (ref 4.0–10.5)
nRBC: 0 % (ref 0.0–0.2)

## 2022-05-12 LAB — URINALYSIS, ROUTINE W REFLEX MICROSCOPIC
Bilirubin Urine: NEGATIVE
Glucose, UA: NEGATIVE mg/dL
Hgb urine dipstick: NEGATIVE
Ketones, ur: NEGATIVE mg/dL
Leukocytes,Ua: NEGATIVE
Nitrite: NEGATIVE
Protein, ur: NEGATIVE mg/dL
Specific Gravity, Urine: 1.012 (ref 1.005–1.030)
pH: 7 (ref 5.0–8.0)

## 2022-05-12 LAB — COMPREHENSIVE METABOLIC PANEL
ALT: 18 U/L (ref 0–44)
AST: 21 U/L (ref 15–41)
Albumin: 4.1 g/dL (ref 3.5–5.0)
Alkaline Phosphatase: 73 U/L (ref 38–126)
Anion gap: 5 (ref 5–15)
BUN: 10 mg/dL (ref 8–23)
CO2: 29 mmol/L (ref 22–32)
Calcium: 9.3 mg/dL (ref 8.9–10.3)
Chloride: 103 mmol/L (ref 98–111)
Creatinine, Ser: 0.8 mg/dL (ref 0.44–1.00)
GFR, Estimated: >60 mL/min (ref >60)
Glucose, Bld: 120 mg/dL — ABNORMAL HIGH (ref 70–99)
Potassium: 3.5 mmol/L (ref 3.5–5.1)
Sodium: 137 mmol/L (ref 135–145)
Total Bilirubin: 0.5 mg/dL (ref 0.3–1.2)
Total Protein: 8.1 g/dL (ref 6.5–8.1)

## 2022-05-12 LAB — LIPASE, BLOOD: Lipase: 27 U/L (ref 11–51)

## 2022-05-12 MED ORDER — AMOXICILLIN-POT CLAVULANATE 875-125 MG PO TABS: 1.0000 | ORAL_TABLET | Freq: Two times a day (BID) | ORAL | 0 refills | Status: AC

## 2022-05-12 MED ORDER — IOHEXOL 300 MG/ML  SOLN
100.0000 mL | Freq: Once | INTRAMUSCULAR | Status: AC | PRN
  Administered 2022-05-12: 100 mL via INTRAVENOUS

## 2022-05-12 NOTE — ED Provider Notes (Signed)
[facility] Provider Note    Event Date/Time   First MD Initiated Contact with Patient 05/12/22 [phone]     (approximate)   History   Abdominal Pain   HPI  Tina Johnston is a 81 y.o. female with history of hypertension, hyperlipidemia and breast cancer who presents with suprapubic and right lower quadrant abdominal pain since yesterday, persistent course, nonradiating, and associated with urinary frequency but no dysuria or hematuria.  She denies nausea or vomiting.  She has no diarrhea or change in her bowel movements.  She denies fever or chills.  She states that she had a bout of pain like this last year.  She had a CT scan at that time which did not show anything and the pain eventually resolved on its own.      Physical Exam   Triage Vital Signs: ED Triage Vitals  Enc Vitals Group     BP --      Pulse Rate 05/12/22 0844 69     Resp 05/12/22 0844 18     Temp 05/12/22 0844 98.3 F (36.8 C)     Temp Source 05/12/22 0844 Oral     SpO2 05/12/22 0844 100 %     Weight 05/12/22 0839 143 lb 4.8 oz (65 kg)     Height 05/12/22 0839 '5\' 3"'$  (1.6 m)     Head Circumference --      Peak Flow --      Pain Score 05/12/22 0839 9     Pain Loc --      Pain Edu? --      Excl. in [place]? --     Most recent vital signs: Vitals:   05/12/22 1000 05/12/22 1030  BP: (!) 170/78 (!) 161/87  Pulse: 90 63  Resp: 14 14  Temp:    SpO2: 97% 98%    General: Alert, well-appearing. CV:  Good peripheral perfusion.  Resp:  Normal effort.  Abd:  Soft with mild right lower quadrant tenderness.  No distention.  Other:  No scleral icterus.   ED Results / Procedures / Treatments   Labs (all labs ordered are listed, but only abnormal results are displayed) Labs Reviewed  COMPREHENSIVE METABOLIC PANEL - Abnormal; Notable for the following components:      Result Value   Glucose, Bld 120 (*)    All other components within normal limits  CBC - Abnormal; Notable for the  following components:   Platelets 426 (*)    All other components within normal limits  URINALYSIS, ROUTINE W REFLEX MICROSCOPIC - Abnormal; Notable for the following components:   Color, Urine YELLOW (*)    APPearance CLEAR (*)    All other components within normal limits  LIPASE, BLOOD     EKG     RADIOLOGY  CT Abd/pelvis: I independently viewed and interpreted the images; there is wall thickening of the sigmoid consistent with diverticulitis  PROCEDURES:  Critical Care performed: No  Procedures   MEDICATIONS ORDERED IN ED: Medications  iohexol (OMNIPAQUE) 300 MG/ML solution 100 mL (100 mLs Intravenous Contrast Given 05/12/22 0944)     IMPRESSION / MDM / ASSESSMENT AND PLAN / ED COURSE  I reviewed the triage vital signs and the nursing notes.  81 year old female with PMH as noted above presents with suprapubic and right lower quadrant pain since yesterday.    On exam the patient is well-appearing.  Her vital signs are normal.  She has some right lower quadrant tenderness  but an otherwise unremarkable exam and is currently declining any pain medication.  I reviewed the past medical records.  The patient states that she had similar pain last year and I reviewed and interpreted the CT abdomen and reviewed the report for the MR abdomen from June and July of last year.  There was mild ascites with no other acute abnormality.  MRI showed some nonspecific liver lesions consistent with likely simple fluid cysts or hemangiomas  Differential diagnosis includes, but is not limited to, UTI, pyelonephritis, appendicitis, diverticulitis, colitis, musculoskeletal pain, hepatobiliary etiology.  We will obtain lab work-up, urinalysis, CT abdomen/pelvis, and reassess  ----------------------------------------- 11:09 AM on 05/12/2022 -----------------------------------------  CT shows uncomplicated sigmoid diverticulitis with no other acute findings.  Given her well appearance,  relatively mild pain, and reassuring lab work-up, and p.o. tolerance, the patient is stable for discharge home with outpatient treatment.  I will prescribe a course of Augmentin.  Return precautions given, and she expresses understanding.   FINAL CLINICAL IMPRESSION(S) / ED DIAGNOSES   Final diagnoses:  Diverticulitis     Rx / DC Orders   ED Discharge Orders          Ordered    amoxicillin-clavulanate (AUGMENTIN) 875-125 MG tablet  2 times daily        05/12/22 1108             Note:  This document was prepared using Dragon voice recognition software and may include unintentional dictation errors.    Arta Silence, MD 05/12/22 1110

## 2022-05-12 NOTE — ED Triage Notes (Signed)
Pt reports lower abd pain that is sharp and intermittent since yesterday. Pt reports also some pain that radiates into her right flank.

## 2022-05-12 NOTE — ED Notes (Signed)
Patient transported to CT 

## 2022-05-12 NOTE — Discharge Instructions (Addendum)
Take the antibiotic as prescribed and finish the full course.  You should eat a softer diet over the next few days to help it pass through your intestines.  Return to the ER for new, worsening, or persistent severe pain, nausea or vomiting, fever, blood in the stool, or any other new or worsening symptoms that concern you.

## 2022-05-15 ENCOUNTER — Other Ambulatory Visit: Payer: Self-pay | Admitting: Neurology

## 2022-05-15 DIAGNOSIS — G9520 Unspecified cord compression: Secondary | ICD-10-CM

## 2022-05-15 DIAGNOSIS — K5792 Diverticulitis of intestine, part unspecified, without perforation or abscess without bleeding: Secondary | ICD-10-CM | POA: Diagnosis not present

## 2022-05-18 DIAGNOSIS — R55 Syncope and collapse: Secondary | ICD-10-CM | POA: Diagnosis not present

## 2022-05-23 ENCOUNTER — Encounter: Payer: Self-pay | Admitting: Internal Medicine

## 2022-05-25 ENCOUNTER — Ambulatory Visit
Admission: RE | Admit: 2022-05-25 | Discharge: 2022-05-25 | Disposition: A | Discharge status: Home / Self Care | Payer: Medicare HMO | Source: Ambulatory Visit | Attending: Neurology | Admitting: Neurology

## 2022-05-25 DIAGNOSIS — M4802 Spinal stenosis, cervical region: Secondary | ICD-10-CM | POA: Diagnosis not present

## 2022-05-25 DIAGNOSIS — M4312 Spondylolisthesis, cervical region: Secondary | ICD-10-CM | POA: Diagnosis not present

## 2022-05-25 DIAGNOSIS — G9520 Unspecified cord compression: Secondary | ICD-10-CM | POA: Diagnosis not present

## 2022-05-26 ENCOUNTER — Emergency Department: Payer: Medicare HMO

## 2022-05-26 ENCOUNTER — Other Ambulatory Visit: Payer: Self-pay

## 2022-05-26 ENCOUNTER — Emergency Department
Admission: EM | Admit: 2022-05-26 | Discharge: 2022-05-27 | Disposition: A | Discharge status: Home / Self Care | Payer: Medicare HMO | Attending: Emergency Medicine | Admitting: Emergency Medicine

## 2022-05-26 DIAGNOSIS — N281 Cyst of kidney, acquired: Secondary | ICD-10-CM | POA: Diagnosis not present

## 2022-05-26 DIAGNOSIS — D72829 Elevated white blood cell count, unspecified: Secondary | ICD-10-CM | POA: Insufficient documentation

## 2022-05-26 DIAGNOSIS — K5792 Diverticulitis of intestine, part unspecified, without perforation or abscess without bleeding: Secondary | ICD-10-CM | POA: Diagnosis not present

## 2022-05-26 DIAGNOSIS — K5732 Diverticulitis of large intestine without perforation or abscess without bleeding: Secondary | ICD-10-CM | POA: Diagnosis not present

## 2022-05-26 DIAGNOSIS — I700 Atherosclerosis of aorta: Secondary | ICD-10-CM | POA: Diagnosis not present

## 2022-05-26 DIAGNOSIS — R1030 Lower abdominal pain, unspecified: Secondary | ICD-10-CM | POA: Diagnosis not present

## 2022-05-26 LAB — CBC
HCT: 41.9 % (ref 36.0–46.0)
Hemoglobin: 12.9 g/dL (ref 12.0–15.0)
MCH: 27.3 pg (ref 26.0–34.0)
MCHC: 30.8 g/dL (ref 30.0–36.0)
MCV: 88.6 fL (ref 80.0–100.0)
Platelets: 467 10*3/uL — ABNORMAL HIGH (ref 150–400)
RBC: 4.73 MIL/uL (ref 3.87–5.11)
RDW: 13.9 % (ref 11.5–15.5)
WBC: 15.5 10*3/uL — ABNORMAL HIGH (ref 4.0–10.5)
nRBC: 0 % (ref 0.0–0.2)

## 2022-05-26 LAB — URINALYSIS, ROUTINE W REFLEX MICROSCOPIC
Bilirubin Urine: NEGATIVE
Glucose, UA: NEGATIVE mg/dL
Hgb urine dipstick: NEGATIVE
Ketones, ur: NEGATIVE mg/dL
Leukocytes,Ua: NEGATIVE
Nitrite: NEGATIVE
Protein, ur: NEGATIVE mg/dL
Specific Gravity, Urine: 1.003 — ABNORMAL LOW (ref 1.005–1.030)
pH: 6 (ref 5.0–8.0)

## 2022-05-26 LAB — COMPREHENSIVE METABOLIC PANEL
ALT: 26 U/L (ref 0–44)
AST: 24 U/L (ref 15–41)
Albumin: 4 g/dL (ref 3.5–5.0)
Alkaline Phosphatase: 79 U/L (ref 38–126)
Anion gap: 8 (ref 5–15)
BUN: 11 mg/dL (ref 8–23)
CO2: 25 mmol/L (ref 22–32)
Calcium: 9.6 mg/dL (ref 8.9–10.3)
Chloride: 104 mmol/L (ref 98–111)
Creatinine, Ser: 0.74 mg/dL (ref 0.44–1.00)
GFR, Estimated: >60 mL/min (ref >60)
Glucose, Bld: 124 mg/dL — ABNORMAL HIGH (ref 70–99)
Potassium: 3.2 mmol/L — ABNORMAL LOW (ref 3.5–5.1)
Sodium: 137 mmol/L (ref 135–145)
Total Bilirubin: 0.5 mg/dL (ref 0.3–1.2)
Total Protein: 8.1 g/dL (ref 6.5–8.1)

## 2022-05-26 MED ORDER — IOHEXOL 300 MG/ML  SOLN
100.0000 mL | Freq: Once | INTRAMUSCULAR | Status: AC | PRN
Start: 2022-05-26 — End: 2022-05-26
  Administered 2022-05-26: 100 mL via INTRAVENOUS

## 2022-05-26 MED ORDER — FENTANYL CITRATE PF 50 MCG/ML IJ SOSY
50.0000 ug | PREFILLED_SYRINGE | Freq: Once | INTRAMUSCULAR | Status: AC
  Administered 2022-05-26: 50 ug via INTRAVENOUS
  Filled 2022-05-26: qty 1

## 2022-05-26 NOTE — ED Provider Notes (Signed)
[facility] Provider Note    Event Date/Time   First MD Initiated Contact with Patient 05/26/22 2151     (approximate)   History   Abdominal Pain   HPI  Tina Johnston is a 81 y.o. female  who presents to the emergency department today because of concerns for abdominal pain.  She states that it is located across her lower abdomen.  It started today.  It is severe especially when the patient is about to have a bowel movement.  It does remind her of when she was recently diagnosed with diverticulitis.  She says she finished the course of antibiotics that were prescribed and she was feeling better.  She denies any associated nausea or vomiting.  Denies any bloody stool.  Denies any measured fevers.  Physical Exam   Triage Vital Signs: ED Triage Vitals [05/26/22 2021]  Enc Vitals Group     BP (!) 169/72     Pulse Rate 74     Resp 16     Temp 98.8 F (37.1 C)     Temp Source Oral     SpO2 100 %     Weight 144 lb (65.3 kg)     Height '5\' 3"'$  (1.6 m)     Head Circumference      Peak Flow      Pain Score 5   Most recent vital signs: Vitals:   05/26/22 2021  BP: (!) 169/72  Pulse: 74  Resp: 16  Temp: 98.8 F (37.1 C)  SpO2: 100%   General: Awake, alert, oriented. CV:  Good peripheral perfusion. Regular rate and rhythm. Resp:  Normal effort. Lungs clear. Abd:  Tender to palpation in the lower abdomen.   ED Results / Procedures / Treatments   Labs (all labs ordered are listed, but only abnormal results are displayed) Labs Reviewed  COMPREHENSIVE METABOLIC PANEL - Abnormal; Notable for the following components:      Result Value   Potassium 3.2 (*)    Glucose, Bld 124 (*)    All other components within normal limits  CBC - Abnormal; Notable for the following components:   WBC 15.5 (*)    Platelets 467 (*)    All other components within normal limits  URINALYSIS, ROUTINE W REFLEX MICROSCOPIC - Abnormal; Notable for the following  components:   Color, Urine STRAW (*)    APPearance CLEAR (*)    Specific Gravity, Urine 1.003 (*)    All other components within normal limits     EKG  None   RADIOLOGY CT ab/pel pending at time of sign out.   PROCEDURES:  Critical Care performed: No  Procedures   MEDICATIONS ORDERED IN ED: Medications - No data to display   IMPRESSION / MDM / [place] / ED COURSE  I reviewed the triage vital signs and the nursing notes.                              Differential diagnosis includes, but is not limited to, diverticulitis, perforation, mesenteric ischemia.  Patient's presentation is most consistent with acute presentation with potential threat to life or bodily function.  Patient presented to the emergency department today because of concerns for lower abdominal pain.  Recent history and diagnosis of diverticulitis.  On exam patient is tender in the lower abdomen.  She does have a leukocytosis in the blood work.  Given concern for possible  recurrent diverticulitis or possible complication such as abscess or perforation CT scan was ordered.  FINAL CLINICAL IMPRESSION(S) / ED DIAGNOSES   Abdominal pain    Note:  This document was prepared using Dragon voice recognition software and may include unintentional dictation errors.    Nance Pear, MD 05/26/22 2351

## 2022-05-26 NOTE — ED Notes (Signed)
Pt denies N/V states dizziness when the pain comes.  States that painful when she is trying to have a bowel movement but not with urination.

## 2022-05-26 NOTE — ED Triage Notes (Signed)
Pt states lower abd pain, bilateral for several days. 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 Pt denies known fever, but states has had lower back pain and diarrhea, denies dysuria.

## 2022-05-27 DIAGNOSIS — K5732 Diverticulitis of large intestine without perforation or abscess without bleeding: Secondary | ICD-10-CM | POA: Diagnosis not present

## 2022-05-27 MED ORDER — PIPERACILLIN-TAZOBACTAM 3.375 G IVPB 30 MIN
3.3750 g | Freq: Once | INTRAVENOUS | Status: AC
  Administered 2022-05-27: 3.375 g via INTRAVENOUS
  Filled 2022-05-27: qty 50

## 2022-05-27 MED ORDER — SULFAMETHOXAZOLE-TRIMETHOPRIM 800-160 MG PO TABS: 1.0000 | ORAL_TABLET | Freq: Two times a day (BID) | ORAL | 0 refills | Status: AC

## 2022-05-27 MED ORDER — METRONIDAZOLE 500 MG PO TABS: 500.0000 mg | ORAL_TABLET | Freq: Three times a day (TID) | ORAL | 0 refills | Status: AC

## 2022-05-27 NOTE — ED Provider Notes (Signed)
I accepted care of this patient from Dr. Archie Balboa pending CT.  The CT shows improved but persistent diverticulitis.  Patient was diagnosed diverticulitis 2 weeks ago and finished a week course of antibiotics.  Her white count is elevated, pain is worse, and since the CT still showing persistent diverticulitis we will treat with Flagyl and Bactrim for 1 week. Patient given a dose of zosyn here.  Abdominal exam is benign.  No signs of sepsis.  No signs of perforation or abscess.  Discussed close follow-up with primary care doctor and discussed my standard return precautions.    I, Rudene Re, attending MD, have personally viewed and interpreted the images obtained during this visit as below:  CT showing noncomplicated diverticuli   ___________________________________________________ Interpretation by Radiologist:  CT ABDOMEN PELVIS W CONTRAST  Result Date: 05/27/2022 CLINICAL DATA:  Abdominal pain. EXAM: CT ABDOMEN AND PELVIS WITH CONTRAST TECHNIQUE: Multidetector CT imaging of the abdomen and pelvis was performed using the standard protocol following bolus administration of intravenous contrast. RADIATION DOSE REDUCTION: This exam was performed according to the departmental dose-optimization program which includes automated exposure control, adjustment of the mA and/or kV according to patient size and/or use of iterative reconstruction technique. CONTRAST:  14m OMNIPAQUE IOHEXOL 300 MG/ML  SOLN COMPARISON:  CT 2 weeks ago 05/12/2022 FINDINGS: Lower chest: Again seen mild cardiomegaly. No acute airspace disease or pleural effusion. Hepatobiliary: Scattered small cysts as well as hypodensities too small to characterize in the liver, without significant interval change. Calcified granuloma in the right hepatic lobe. Gallbladder physiologically distended, no calcified stone. No biliary dilatation. Pancreas: Parenchymal atrophy. No ductal dilatation or inflammation. Spleen: Normal in size without focal  abnormality. Adrenals/Urinary Tract: Normal adrenal glands. No hydronephrosis. There are small renal cysts as well as low-density lesions too small to accurately characterize, without significant interval change. No dedicated follow-up recommended. No renal calculi. Unremarkable urinary bladder. Stomach/Bowel: The stomach is decompressed. There is no small bowel obstruction or inflammation. Appendix not visualized, no appendicitis. Improved diverticulitis of the proximal sigmoid colon. There is mild residual fat stranding with a small amount of non organized fluid in the region of prior inflammation. No discrete abscess or perforation. No new sites of diverticulitis. Vascular/Lymphatic: Aortic atherosclerosis without aneurysm. Patent portal vein. No abdominopelvic adenopathy. Reproductive: Hysterectomy.  No adnexal mass. Other: Minimal non organized free fluid in the pelvis. No abscess or perforation. Tiny fat containing umbilical hernia. Minimal fat in the inguinal canals. Musculoskeletal: Diffuse degenerative change in the spine with multilevel vacuum phenomenon and facet hypertrophy. There are no acute or suspicious osseous abnormalities. IMPRESSION: 1. Improved diverticulitis of the proximal sigmoid colon with mild residual fat stranding and non organized fluid in the region of prior inflammation. No abscess or perforation. 2. No other interval change. Aortic Atherosclerosis (ICD10-I70.0). Electronically Signed   By: MKeith RakeM.D.   On: 05/27/2022 00:22       VRudene Re MD 05/27/22 [phone]

## 2022-05-30 DIAGNOSIS — I10 Essential (primary) hypertension: Secondary | ICD-10-CM | POA: Diagnosis not present

## 2022-05-30 DIAGNOSIS — Z09 Encounter for follow-up examination after completed treatment for conditions other than malignant neoplasm: Secondary | ICD-10-CM | POA: Diagnosis not present

## 2022-05-30 DIAGNOSIS — M4802 Spinal stenosis, cervical region: Secondary | ICD-10-CM | POA: Diagnosis not present

## 2022-05-30 DIAGNOSIS — K5732 Diverticulitis of large intestine without perforation or abscess without bleeding: Secondary | ICD-10-CM | POA: Diagnosis not present

## 2022-06-10 ENCOUNTER — Ambulatory Visit
Admission: RE | Admit: 2022-06-10 | Discharge: 2022-06-10 | Disposition: A | Discharge status: Home / Self Care | Payer: Medicare HMO | Source: Ambulatory Visit | Attending: Internal Medicine | Admitting: Internal Medicine

## 2022-06-10 DIAGNOSIS — Z79899 Other long term (current) drug therapy: Secondary | ICD-10-CM | POA: Insufficient documentation

## 2022-06-10 DIAGNOSIS — Z923 Personal history of irradiation: Secondary | ICD-10-CM | POA: Insufficient documentation

## 2022-06-10 DIAGNOSIS — Z1231 Encounter for screening mammogram for malignant neoplasm of breast: Secondary | ICD-10-CM | POA: Diagnosis not present

## 2022-06-10 DIAGNOSIS — Z853 Personal history of malignant neoplasm of breast: Secondary | ICD-10-CM | POA: Diagnosis not present

## 2022-06-10 DIAGNOSIS — Z79890 Hormone replacement therapy (postmenopausal): Secondary | ICD-10-CM | POA: Diagnosis not present

## 2022-06-10 DIAGNOSIS — C50411 Malignant neoplasm of upper-outer quadrant of right female breast: Secondary | ICD-10-CM | POA: Diagnosis not present

## 2022-06-10 DIAGNOSIS — Z170 Estrogen receptor positive status [ER+]: Secondary | ICD-10-CM | POA: Insufficient documentation

## 2022-06-10 DIAGNOSIS — M8588 Other specified disorders of bone density and structure, other site: Secondary | ICD-10-CM | POA: Insufficient documentation

## 2022-06-10 DIAGNOSIS — Z780 Asymptomatic menopausal state: Secondary | ICD-10-CM | POA: Insufficient documentation

## 2022-06-10 DIAGNOSIS — Z90710 Acquired absence of both cervix and uterus: Secondary | ICD-10-CM | POA: Insufficient documentation

## 2022-06-10 DIAGNOSIS — M8589 Other specified disorders of bone density and structure, multiple sites: Secondary | ICD-10-CM | POA: Diagnosis not present

## 2022-06-11 DIAGNOSIS — H269 Unspecified cataract: Secondary | ICD-10-CM | POA: Diagnosis not present

## 2022-06-11 DIAGNOSIS — H6981 Other specified disorders of Eustachian tube, right ear: Secondary | ICD-10-CM | POA: Diagnosis not present

## 2022-06-11 DIAGNOSIS — R42 Dizziness and giddiness: Secondary | ICD-10-CM | POA: Diagnosis not present

## 2022-06-11 DIAGNOSIS — G9520 Unspecified cord compression: Secondary | ICD-10-CM | POA: Diagnosis not present

## 2022-06-11 DIAGNOSIS — E041 Nontoxic single thyroid nodule: Secondary | ICD-10-CM | POA: Diagnosis not present

## 2022-06-11 DIAGNOSIS — G3184 Mild cognitive impairment, so stated: Secondary | ICD-10-CM | POA: Diagnosis not present

## 2022-06-11 DIAGNOSIS — R9082 White matter disease, unspecified: Secondary | ICD-10-CM | POA: Diagnosis not present

## 2022-06-11 DIAGNOSIS — R55 Syncope and collapse: Secondary | ICD-10-CM | POA: Diagnosis not present

## 2022-06-11 DIAGNOSIS — G959 Disease of spinal cord, unspecified: Secondary | ICD-10-CM | POA: Diagnosis not present

## 2022-06-12 DIAGNOSIS — H2513 Age-related nuclear cataract, bilateral: Secondary | ICD-10-CM | POA: Diagnosis not present

## 2022-06-12 DIAGNOSIS — H524 Presbyopia: Secondary | ICD-10-CM | POA: Diagnosis not present

## 2022-06-12 DIAGNOSIS — H401231 Low-tension glaucoma, bilateral, mild stage: Secondary | ICD-10-CM | POA: Diagnosis not present

## 2022-06-12 DIAGNOSIS — H5203 Hypermetropia, bilateral: Secondary | ICD-10-CM | POA: Diagnosis not present

## 2022-06-12 DIAGNOSIS — H52223 Regular astigmatism, bilateral: Secondary | ICD-10-CM | POA: Diagnosis not present

## 2022-06-18 DIAGNOSIS — E041 Nontoxic single thyroid nodule: Secondary | ICD-10-CM | POA: Diagnosis not present

## 2022-06-18 DIAGNOSIS — M4802 Spinal stenosis, cervical region: Secondary | ICD-10-CM | POA: Diagnosis not present

## 2022-06-18 DIAGNOSIS — G959 Disease of spinal cord, unspecified: Secondary | ICD-10-CM | POA: Diagnosis not present

## 2022-07-03 DIAGNOSIS — H2511 Age-related nuclear cataract, right eye: Secondary | ICD-10-CM | POA: Diagnosis not present

## 2022-07-09 DIAGNOSIS — G959 Disease of spinal cord, unspecified: Secondary | ICD-10-CM | POA: Diagnosis not present

## 2022-07-09 DIAGNOSIS — E041 Nontoxic single thyroid nodule: Secondary | ICD-10-CM | POA: Diagnosis not present

## 2022-07-12 ENCOUNTER — Encounter: Payer: Self-pay | Admitting: Neurosurgery

## 2022-07-12 ENCOUNTER — Other Ambulatory Visit: Payer: Self-pay

## 2022-07-12 ENCOUNTER — Ambulatory Visit: Payer: Medicare HMO | Admitting: Neurosurgery

## 2022-07-12 VITALS — BP 126/82 | Ht 63.0 in | Wt 141.4 lb

## 2022-07-12 DIAGNOSIS — Z01818 Encounter for other preprocedural examination: Secondary | ICD-10-CM

## 2022-07-12 DIAGNOSIS — M4802 Spinal stenosis, cervical region: Secondary | ICD-10-CM

## 2022-07-12 DIAGNOSIS — G959 Disease of spinal cord, unspecified: Secondary | ICD-10-CM

## 2022-07-12 NOTE — Patient Instructions (Signed)
Below is a summary of what we discussed in regards to your upcoming surgery:  Planned surgery: C3-4 anterior cervical discectomy and fusion   Surgery date: 07/31/22 - you will find out your arrival time the business day before your surgery.   NSAIDS (Non-steroidal anti-inflammatory drugs): because you are having a fusion, no NSAIDS (such as ibuprofen, aleve, naproxen, meloxicam, diclofenac) for 3 months after surgery. Celebrex is an exception. Tylenol is ok because it is not an NSAID.   Pre-op appointment at [facility]: we will call you with a date/time for this. Pre-admit testing is located on the first floor of [facility], [address].   Pre-op labs may be done at your pre-op appointment. You are not required to fast for these labs.    Covid testing:  a pre-op Covid test is not required unless you are symptomatic or exposed.    Should you need to change your pre-op appointment, please call Pre-admit testing at [phone].     Home health physical therapy: [facility] [place] will contact you regarding home health physical therapy for after surgery.Their number is [phone].   If you are having a fusion or arthroplasty procedure: for appointments after your 2 week follow-up: please arrive at the [facility] outpatient imaging center ([place], [place]) or [facility] one hour prior to your appointment for x-rays. This applies to every appointment after your 2 week follow-up. Failure to do so may result in your appointment being rescheduled.   If you have FMLA/disability paperwork, please drop it off or fax it to [phone], attention [place].   If you have any questions/concerns before or after surgery, you can reach [place] at [phone], or you can send a mychart message. If you have a concern after hours that cannot wait until normal business hours, you can call [phone] or  [phone] and ask the answering service to page the neurosurgeon on call.    Appointments/FMLA & disability paperwork: [place] Nurse: Ophelia Shoulder  Medical assistant: Raquel Sarna Surgeon: Dr Meade Maw Physician Assistant: Cooper Render

## 2022-07-12 NOTE — H&P (View-Only) (Signed)
Referring Physician:  Marinda Elk, [facility] [place],  [place] [address]  Primary Physician:  Marinda Elk, MD  History of Present Illness: 07/12/2022 [name] is here today with a chief complaint of worsening balance.  Please see her note from her prior visit for a more detailed history.   06/18/2022 [name] is here today with a chief complaint of dizziness, balance issues, tingling in the fingertips of her left hand. She has recently started to notice similar symptoms in her right hand.  She is having cramping in her calves and feet at night. She sometimes feels like she is walking on ice or cement. She has not fallen, but does have some imbalance. She is numb in both of her hands. She is unable to put in her earrings. Bowel/Bladder Dysfunction: none  Conservative measures:  Physical therapy: has not participated Multimodal medical therapy including regular antiinflammatories: tylenol, gabapentin Injections: has not received epidural steroid injections  Past Surgery: denies  Tina Johnston has symptoms of cervical myelopathy.  The symptoms are causing a significant impact on the patient's life.   Review of Systems:  A 10 point review of systems is negative, except for the pertinent positives and negatives detailed in the HPI.  Past Medical History: Past Medical History:  Diagnosis Date   Arthritis    Breast cancer ([place]) 2014   Bilateral- radiation   Breast cyst    Family history of breast cancer    Family history of lung cancer    Family history of ovarian cancer    Family history of prostate cancer    Hyperlipidemia    Hypertension    Personal history of breast cancer    Personal history of radiation therapy 2014   Bilateral lumpectomy    Past Surgical History: Past Surgical History:  Procedure Laterality Date   ABDOMINAL HYSTERECTOMY  1968   BREAST BIOPSY Bilateral 2014    Bilateral Bx Both Positive c Bilateral Lumpectomy   BREAST CYST ASPIRATION Right 2014   Positive   BREAST LUMPECTOMY Bilateral 2014   f/u radiation    BREAST SURGERY Bilateral July 15 2013   wide excision w sentinel node biopsy   SENTINEL LYMPH NODE BIOPSY Bilateral     Allergies: Allergies as of 07/12/2022 - Review Complete 07/12/2022  Allergen Reaction Noted   Ultram [tramadol] Itching 07/27/2013    Medications: Current Meds  Medication Sig   acetaminophen (TYLENOL) 500 MG tablet Take 500 mg by mouth 2 (two) times daily.   amLODipine (NORVASC) 5 MG tablet Take 7.5 tablets by mouth daily.   Ascorbic Acid (VITAMIN C) 100 MG tablet Take 100 mg by mouth daily.   Calcium Carb-Cholecalciferol 600-800 MG-UNIT TABS Take 1 tablet by mouth 2 (two) times daily.   donepezil (ARICEPT) 5 MG tablet Take 5 mg by mouth in the morning.   fluticasone (FLONASE) 50 MCG/ACT nasal spray Place 1 spray into both nostrils daily.   gabapentin (NEURONTIN) 100 MG capsule Take 100 mg by mouth at bedtime.   meclizine (ANTIVERT) 25 MG tablet Take 1 tablet (25 mg total) by mouth 3 (three) times daily as needed for dizziness.   Multiple Vitamins-Minerals (MULTIVITAMIN WITH MINERALS) tablet Take 1 tablet by mouth daily.   PATADAY 0.2 % SOLN Place 1 drop into both eyes daily.   potassium chloride (K-DUR) 10 MEQ tablet Take 1 tablet (10 mEq total) by mouth daily. (Patient taking differently: Take 10 mEq  by mouth 2 (two) times daily.)   simvastatin (ZOCOR) 20 MG tablet Take 1 tablet by mouth daily.   timolol (BETIMOL) 0.25 % ophthalmic solution Place 1-2 drops into both eyes daily.    Social History: Social History   Tobacco Use   Smoking status: Never   Smokeless tobacco: Never  Substance Use Topics   Alcohol use: No   Drug use: No    Family Medical History: Family History  Problem Relation Age of Onset   Breast cancer Mother        dx late 72s, d. 83   Lung cancer Father        d. 81   Breast  cancer Sister        dx 74   Breast cancer Sister        dx 5   Prostate cancer Brother        d. 77   Ovarian cancer Paternal Grandmother        d. 82   Lung cancer Paternal Grandfather        d. 10   Other Daughter        brain tumor, unk type    Physical Examination: Vitals:   07/12/22 1151  BP: 126/82    General: Patient is well developed, well nourished, calm, collected, and in no apparent distress. Attention to examination is appropriate.  Neck:   Supple.  Full range of motion.  Respiratory: Patient is breathing without any difficulty.   NEUROLOGICAL:     Awake, alert, oriented to person, place, and time.  Speech is clear and fluent. Fund of knowledge is appropriate.   Cranial Nerves: Pupils equal round and reactive to light.  Facial tone is symmetric.  Facial sensation is symmetric. Shoulder shrug is symmetric. Tongue protrusion is midline.  There is no pronator drift.  ROM of spine: full.    Strength: Side Biceps Triceps Deltoid Interossei Grip Wrist Ext. Wrist Flex.  R '5 5 5 5 5 5 5  '$ L '5 5 5 4 5 5 5   '$ Side Iliopsoas Quads Hamstring PF DF EHL  R '5 5 5 5 5 5  '$ L '5 5 5 5 5 5   '$ Reflexes are 2+ and symmetric at the biceps, triceps, brachioradialis, patella and achilles.   Hoffman's is present on the left.  Clonus is not present.  Toes are down-going.  Bilateral upper and lower extremity sensation is intact to light touch.    No evidence of dysmetria noted.  Gait is abnormal and wide-based.  She has moderate difficulty with tandem gait.  Medical Decision Making  Imaging: MRI C spine 05/25/22 IMPRESSION:  1. At C3-C4, severe canal stenosis with cord compression. Slight T2  hyperintense signal within the cord, suspicious for cord edema.  Severe left foraminal stenosis at this level.  2. Moderate foraminal stenosis bilaterally at C4-C5, C5-C6 and C7-T1  and on the left at C6-C7.  3. Mild canal stenosis at C4-C5 and C5-C6.  4. Approximally 3.8 cm right  thyroid nodule. Recommend thyroid  ultrasound. (Ref: J Am Coll Radiol. 2015 Feb;12(2): 143-50).   These results will be called to the ordering clinician or  representative by the Radiologist Assistant, and communication  documented in the PACS or [employer].   Electronically Signed  By: Margaretha Sheffield M.D.  On: 05/27/2022 13:36    I have personally reviewed the images and agree with the above interpretation.  Assessment and Plan: Tina Johnston is a pleasant 81  y.o. 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 She also has a right thyroid nodule.   I recommended C3-4 anterior cervical discectomy and fusion.  She would like to move forward with surgery.  She is concerned about her symptoms and whether they will worsen while she is waiting to have surgery.     I discussed the planned procedure at length with the patient, including the risks, benefits, alternatives, and indications. The risks discussed include but are not limited to bleeding, infection, need for reoperation, spinal fluid leak, stroke, vision loss, anesthetic complication, coma, paralysis, and even death. We also discussed the possibility of post-operative dysphagia, vocal cord paralysis, and the risk of adjacent segment disease in the future. I also described in detail that improvement was not guaranteed.  The patient expressed understanding of these risks, and asked that we proceed with surgery. I described the surgery in layman's terms, and gave ample opportunity for questions, which were answered to the best of my ability.  I spent a total of 30 minutes in both face-to-face and non-face-to-face activities for this visit on the date of this encounter.  Thank you for involving me in the care of this patient.      Aubry Tucholski K. Izora Ribas MD, [facility] Neurosurgery   07/17/22 1701 I am including this addendum to expand on my rationale for surgery.  The patient suffering from  worsening neurologic condition due to severe cervical stenosis.  She has developed a significant disc herniation and possible anterolisthesis of C3 on C4.  She has substantial anterior compression.  To successfully achieve decompression of her cervical spine, discectomy will be required to address her anterior pathology.  This will cause iatrogenic instability, as the approach will destabilize the joint.  Thus, surgical fixation is also indicated.  It is well within the standard of care to recommend anterior cervical discectomy and fusion for this condition.

## 2022-07-12 NOTE — Progress Notes (Addendum)
Referring Physician:  Marinda Elk, [facility] [place],  [place] [address]  Primary Physician:  Marinda Elk, MD  History of Present Illness: 07/12/2022 [name] is here today with a chief complaint of worsening balance.  Please see her note from her prior visit for a more detailed history.   06/18/2022 [name] is here today with a chief complaint of dizziness, balance issues, tingling in the fingertips of her left hand. She has recently started to notice similar symptoms in her right hand.  She is having cramping in her calves and feet at night. She sometimes feels like she is walking on ice or cement. She has not fallen, but does have some imbalance. She is numb in both of her hands. She is unable to put in her earrings. Bowel/Bladder Dysfunction: none  Conservative measures:  Physical therapy: has not participated Multimodal medical therapy including regular antiinflammatories: tylenol, gabapentin Injections: has not received epidural steroid injections  Past Surgery: denies  Tina Johnston has symptoms of cervical myelopathy.  The symptoms are causing a significant impact on the patient's life.   Review of Systems:  A 10 point review of systems is negative, except for the pertinent positives and negatives detailed in the HPI.  Past Medical History: Past Medical History:  Diagnosis Date   Arthritis    Breast cancer ([place]) 2014   Bilateral- radiation   Breast cyst    Family history of breast cancer    Family history of lung cancer    Family history of ovarian cancer    Family history of prostate cancer    Hyperlipidemia    Hypertension    Personal history of breast cancer    Personal history of radiation therapy 2014   Bilateral lumpectomy    Past Surgical History: Past Surgical History:  Procedure Laterality Date   ABDOMINAL HYSTERECTOMY  1968   BREAST BIOPSY Bilateral 2014    Bilateral Bx Both Positive c Bilateral Lumpectomy   BREAST CYST ASPIRATION Right 2014   Positive   BREAST LUMPECTOMY Bilateral 2014   f/u radiation    BREAST SURGERY Bilateral July 15 2013   wide excision w sentinel node biopsy   SENTINEL LYMPH NODE BIOPSY Bilateral     Allergies: Allergies as of 07/12/2022 - Review Complete 07/12/2022  Allergen Reaction Noted   Ultram [tramadol] Itching 07/27/2013    Medications: Current Meds  Medication Sig   acetaminophen (TYLENOL) 500 MG tablet Take 500 mg by mouth 2 (two) times daily.   amLODipine (NORVASC) 5 MG tablet Take 7.5 tablets by mouth daily.   Ascorbic Acid (VITAMIN C) 100 MG tablet Take 100 mg by mouth daily.   Calcium Carb-Cholecalciferol 600-800 MG-UNIT TABS Take 1 tablet by mouth 2 (two) times daily.   donepezil (ARICEPT) 5 MG tablet Take 5 mg by mouth in the morning.   fluticasone (FLONASE) 50 MCG/ACT nasal spray Place 1 spray into both nostrils daily.   gabapentin (NEURONTIN) 100 MG capsule Take 100 mg by mouth at bedtime.   meclizine (ANTIVERT) 25 MG tablet Take 1 tablet (25 mg total) by mouth 3 (three) times daily as needed for dizziness.   Multiple Vitamins-Minerals (MULTIVITAMIN WITH MINERALS) tablet Take 1 tablet by mouth daily.   PATADAY 0.2 % SOLN Place 1 drop into both eyes daily.   potassium chloride (K-DUR) 10 MEQ tablet Take 1 tablet (10 mEq total) by mouth daily. (Patient taking differently: Take 10 mEq  by mouth 2 (two) times daily.)   simvastatin (ZOCOR) 20 MG tablet Take 1 tablet by mouth daily.   timolol (BETIMOL) 0.25 % ophthalmic solution Place 1-2 drops into both eyes daily.    Social History: Social History   Tobacco Use   Smoking status: Never   Smokeless tobacco: Never  Substance Use Topics   Alcohol use: No   Drug use: No    Family Medical History: Family History  Problem Relation Age of Onset   Breast cancer Mother        dx late 75s, d. 44   Lung cancer Father        d. 71   Breast  cancer Sister        dx 30   Breast cancer Sister        dx 25   Prostate cancer Brother        d. 49   Ovarian cancer Paternal Grandmother        d. 80   Lung cancer Paternal Grandfather        d. 72   Other Daughter        brain tumor, unk type    Physical Examination: Vitals:   07/12/22 1151  BP: 126/82    General: Patient is well developed, well nourished, calm, collected, and in no apparent distress. Attention to examination is appropriate.  Neck:   Supple.  Full range of motion.  Respiratory: Patient is breathing without any difficulty.   NEUROLOGICAL:     Awake, alert, oriented to person, place, and time.  Speech is clear and fluent. Fund of knowledge is appropriate.   Cranial Nerves: Pupils equal round and reactive to light.  Facial tone is symmetric.  Facial sensation is symmetric. Shoulder shrug is symmetric. Tongue protrusion is midline.  There is no pronator drift.  ROM of spine: full.    Strength: Side Biceps Triceps Deltoid Interossei Grip Wrist Ext. Wrist Flex.  R '5 5 5 5 5 5 5  '$ L '5 5 5 4 5 5 5   '$ Side Iliopsoas Quads Hamstring PF DF EHL  R '5 5 5 5 5 5  '$ L '5 5 5 5 5 5   '$ Reflexes are 2+ and symmetric at the biceps, triceps, brachioradialis, patella and achilles.   Hoffman's is present on the left.  Clonus is not present.  Toes are down-going.  Bilateral upper and lower extremity sensation is intact to light touch.    No evidence of dysmetria noted.  Gait is abnormal and wide-based.  She has moderate difficulty with tandem gait.  Medical Decision Making  Imaging: MRI C spine 05/25/22 IMPRESSION:  1. At C3-C4, severe canal stenosis with cord compression. Slight T2  hyperintense signal within the cord, suspicious for cord edema.  Severe left foraminal stenosis at this level.  2. Moderate foraminal stenosis bilaterally at C4-C5, C5-C6 and C7-T1  and on the left at C6-C7.  3. Mild canal stenosis at C4-C5 and C5-C6.  4. Approximally 3.8 cm right  thyroid nodule. Recommend thyroid  ultrasound. (Ref: J Am Coll Radiol. 2015 Feb;12(2): 143-50).   These results will be called to the ordering clinician or  representative by the Radiologist Assistant, and communication  documented in the PACS or [employer].   Electronically Signed  By: Margaretha Sheffield M.D.  On: 05/27/2022 13:36    I have personally reviewed the images and agree with the above interpretation.  Assessment and Plan: [name] is a pleasant 81  y.o. 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 She also has a right thyroid nodule.   I recommended C3-4 anterior cervical discectomy and fusion.  She would like to move forward with surgery.  She is concerned about her symptoms and whether they will worsen while she is waiting to have surgery.     I discussed the planned procedure at length with the patient, including the risks, benefits, alternatives, and indications. The risks discussed include but are not limited to bleeding, infection, need for reoperation, spinal fluid leak, stroke, vision loss, anesthetic complication, coma, paralysis, and even death. We also discussed the possibility of post-operative dysphagia, vocal cord paralysis, and the risk of adjacent segment disease in the future. I also described in detail that improvement was not guaranteed.  The patient expressed understanding of these risks, and asked that we proceed with surgery. I described the surgery in layman's terms, and gave ample opportunity for questions, which were answered to the best of my ability.  I spent a total of 30 minutes in both face-to-face and non-face-to-face activities for this visit on the date of this encounter.  Thank you for involving me in the care of this patient.      Zarina Pe K. Izora Ribas MD, [facility] Neurosurgery   07/17/22 1701 I am including this addendum to expand on my rationale for surgery.  The patient suffering from  worsening neurologic condition due to severe cervical stenosis.  She has developed a significant disc herniation and possible anterolisthesis of C3 on C4.  She has substantial anterior compression.  To successfully achieve decompression of her cervical spine, discectomy will be required to address her anterior pathology.  This will cause iatrogenic instability, as the approach will destabilize the joint.  Thus, surgical fixation is also indicated.  It is well within the standard of care to recommend anterior cervical discectomy and fusion for this condition.

## 2022-07-19 ENCOUNTER — Encounter
Admission: RE | Admit: 2022-07-19 | Discharge: 2022-07-19 | Disposition: A | Discharge status: Home / Self Care | Payer: Medicare HMO | Source: Ambulatory Visit | Attending: Neurosurgery | Admitting: Neurosurgery

## 2022-07-19 ENCOUNTER — Other Ambulatory Visit: Payer: Self-pay

## 2022-07-19 VITALS — BP 148/65 | HR 68 | Resp 16 | Ht 63.0 in | Wt 141.0 lb

## 2022-07-19 DIAGNOSIS — I454 Nonspecific intraventricular block: Secondary | ICD-10-CM | POA: Diagnosis not present

## 2022-07-19 DIAGNOSIS — G959 Disease of spinal cord, unspecified: Secondary | ICD-10-CM | POA: Diagnosis not present

## 2022-07-19 DIAGNOSIS — I517 Cardiomegaly: Secondary | ICD-10-CM | POA: Diagnosis not present

## 2022-07-19 DIAGNOSIS — Z01818 Encounter for other preprocedural examination: Secondary | ICD-10-CM | POA: Diagnosis not present

## 2022-07-19 DIAGNOSIS — E876 Hypokalemia: Secondary | ICD-10-CM | POA: Insufficient documentation

## 2022-07-19 LAB — URINALYSIS, COMPLETE (UACMP) WITH MICROSCOPIC
Bilirubin Urine: NEGATIVE
Glucose, UA: NEGATIVE mg/dL
Hgb urine dipstick: NEGATIVE
Ketones, ur: NEGATIVE mg/dL
Leukocytes,Ua: NEGATIVE
Nitrite: NEGATIVE
Protein, ur: NEGATIVE mg/dL
Specific Gravity, Urine: 1.014 (ref 1.005–1.030)
pH: 6 (ref 5.0–8.0)

## 2022-07-19 LAB — SURGICAL PCR SCREEN
MRSA, PCR: NEGATIVE
Staphylococcus aureus: NEGATIVE

## 2022-07-19 LAB — POTASSIUM: Potassium: 3.2 mmol/L — ABNORMAL LOW (ref 3.5–5.1)

## 2022-07-19 LAB — TYPE AND SCREEN
ABO/RH(D): O POS
Antibody Screen: NEGATIVE

## 2022-07-19 NOTE — Patient Instructions (Signed)
Your procedure is scheduled on: 07/31/22 Report to [facility]. To find out your arrival time please call [phone] between 1PM - 3PM on 07/30/22.  Remember: Instructions that are not followed completely may result in serious medical risk, up to and including death, or upon the discretion of your surgeon and anesthesiologist your surgery may need to be rescheduled.     _X__ 1. Do not eat food after midnight the night before your procedure.                 No gum chewing or hard candies. You may drink clear liquids up to 2 hours                 before you are scheduled to arrive for your surgery- DO not drink clear                 liquids within 2 hours of the start of your surgery.                 Clear Liquids include:  water, apple juice without pulp, clear carbohydrate                 drink such as Clearfast or Gatorade, Black Coffee or Tea (Do not add                 anything to coffee or tea). Diabetics water only  __X__2.  On the morning of surgery brush your teeth with toothpaste and water, you                 may rinse your mouth with mouthwash if you wish.  Do not swallow any              toothpaste of mouthwash.     _X__ 3.  No Alcohol for 24 hours before or after surgery.   _X__ 4.  Do Not Smoke or use e-cigarettes For 24 Hours Prior to Your Surgery.                 Do not use any chewable tobacco products for at least 6 hours prior to                 surgery.  ____  5.  Bring all medications with you on the day of surgery if instructed.   __X__  6.  Notify your doctor if there is any change in your medical condition      (cold, fever, infections).     Do not wear jewelry, make-up, hairpins, clips or nail polish. Do not wear lotions, powders, or perfumes. No deodorant Do not shave body hair 48 hours prior to surgery. Men may shave face and neck. Do not bring valuables to the hospital.    [facility] is not responsible  for any belongings or valuables.  Contacts, dentures/partials or body piercings may not be worn into surgery. Bring a case for your contacts, glasses or hearing aids, a denture cup will be supplied. Leave your suitcase in the car. After surgery it may be brought to your room. For patients admitted to the hospital, discharge time is determined by your treatment team.   Patients discharged the day of surgery will not be allowed to drive home.   Please read over the following fact sheets that you were given:   MRSA Information, CHG soap  __X__ Take these medicines the morning of surgery with A  SIP OF WATER:    1. amLODipine (NORVASC) 7.5 MG tablet  2.   3.   4.  5.  6. You may use your eye drops and nasal spray  ____ Fleet Enema (as directed)   __X__ Use CHG Soap/SAGE wipes as directed  ____ Use inhalers on the day of surgery  ____ Stop metformin/Janumet/Farxiga 2 days prior to surgery    ____ Take 1/2 of usual insulin dose the night before surgery. No insulin the morning          of surgery.   ____ Stop Blood Thinners Coumadin/Plavix/Xarelto/Pleta/Pradaxa/Eliquis/Effient/Aspirin  on   Or contact your Surgeon, Cardiologist or Medical Doctor regarding  ability to stop your blood thinners  __X__ Stop Anti-inflammatories 7 days before surgery such as Advil, Ibuprofen, Motrin,  BC or Goodies Powder, Naprosyn, Naproxen, Aleve, Aspirin You can continue taking Tylenol as needed for pain   __X__ Stop all herbals and supplements, fish oil or vitamins  until after surgery.  Last dose of Vitamin C and Multivitamin will be on 07/24/22  ____ Bring C-Pap to the hospital.

## 2022-07-23 DIAGNOSIS — E782 Mixed hyperlipidemia: Secondary | ICD-10-CM | POA: Diagnosis not present

## 2022-07-23 DIAGNOSIS — I10 Essential (primary) hypertension: Secondary | ICD-10-CM | POA: Diagnosis not present

## 2022-07-23 DIAGNOSIS — R7303 Prediabetes: Secondary | ICD-10-CM | POA: Diagnosis not present

## 2022-07-24 ENCOUNTER — Ambulatory Visit: Admit: 2022-07-24 | Payer: Medicare HMO | Admitting: Ophthalmology

## 2022-07-24 SURGERY — PHACOEMULSIFICATION, CATARACT, WITH IOL INSERTION
Anesthesia: Topical | Laterality: Right

## 2022-07-31 ENCOUNTER — Ambulatory Visit: Payer: Medicare HMO | Admitting: Urgent Care

## 2022-07-31 ENCOUNTER — Encounter
Admission: RE | Disposition: A | Discharge status: Home Health | Payer: Self-pay | Source: Home / Self Care | Attending: Neurosurgery

## 2022-07-31 ENCOUNTER — Other Ambulatory Visit: Payer: Self-pay

## 2022-07-31 ENCOUNTER — Encounter: Payer: Self-pay | Admitting: Neurosurgery

## 2022-07-31 ENCOUNTER — Ambulatory Visit: Payer: Medicare HMO

## 2022-07-31 ENCOUNTER — Observation Stay (HOSPITAL_BASED_OUTPATIENT_CLINIC_OR_DEPARTMENT_OTHER)
Admission: RE | Admit: 2022-07-31 | Discharge: 2022-08-01 | Disposition: A | Discharge status: Home Health | Payer: Medicare HMO | Source: Home / Self Care | Attending: Neurosurgery | Admitting: Neurosurgery

## 2022-07-31 DIAGNOSIS — M5001 Cervical disc disorder with myelopathy,  high cervical region: Secondary | ICD-10-CM | POA: Diagnosis present

## 2022-07-31 DIAGNOSIS — Z853 Personal history of malignant neoplasm of breast: Secondary | ICD-10-CM | POA: Diagnosis not present

## 2022-07-31 DIAGNOSIS — R350 Frequency of micturition: Secondary | ICD-10-CM

## 2022-07-31 DIAGNOSIS — Z8249 Family history of ischemic heart disease and other diseases of the circulatory system: Secondary | ICD-10-CM | POA: Diagnosis not present

## 2022-07-31 DIAGNOSIS — E876 Hypokalemia: Secondary | ICD-10-CM

## 2022-07-31 DIAGNOSIS — Z923 Personal history of irradiation: Secondary | ICD-10-CM | POA: Diagnosis not present

## 2022-07-31 DIAGNOSIS — S1093XA Contusion of unspecified part of neck, initial encounter: Secondary | ICD-10-CM | POA: Diagnosis not present

## 2022-07-31 DIAGNOSIS — E785 Hyperlipidemia, unspecified: Secondary | ICD-10-CM | POA: Diagnosis present

## 2022-07-31 DIAGNOSIS — J398 Other specified diseases of upper respiratory tract: Secondary | ICD-10-CM | POA: Diagnosis present

## 2022-07-31 DIAGNOSIS — M47812 Spondylosis without myelopathy or radiculopathy, cervical region: Secondary | ICD-10-CM | POA: Diagnosis not present

## 2022-07-31 DIAGNOSIS — E041 Nontoxic single thyroid nodule: Secondary | ICD-10-CM | POA: Insufficient documentation

## 2022-07-31 DIAGNOSIS — M4802 Spinal stenosis, cervical region: Secondary | ICD-10-CM | POA: Diagnosis present

## 2022-07-31 DIAGNOSIS — R222 Localized swelling, mass and lump, trunk: Secondary | ICD-10-CM | POA: Diagnosis not present

## 2022-07-31 DIAGNOSIS — Z79899 Other long term (current) drug therapy: Secondary | ICD-10-CM | POA: Diagnosis not present

## 2022-07-31 DIAGNOSIS — R221 Localized swelling, mass and lump, neck: Secondary | ICD-10-CM | POA: Diagnosis not present

## 2022-07-31 DIAGNOSIS — Z803 Family history of malignant neoplasm of breast: Secondary | ICD-10-CM | POA: Diagnosis not present

## 2022-07-31 DIAGNOSIS — G3184 Mild cognitive impairment, so stated: Secondary | ICD-10-CM | POA: Diagnosis present

## 2022-07-31 DIAGNOSIS — Z885 Allergy status to narcotic agent status: Secondary | ICD-10-CM | POA: Diagnosis not present

## 2022-07-31 DIAGNOSIS — Z4682 Encounter for fitting and adjustment of non-vascular catheter: Secondary | ICD-10-CM | POA: Diagnosis not present

## 2022-07-31 DIAGNOSIS — Z90710 Acquired absence of both cervix and uterus: Secondary | ICD-10-CM | POA: Diagnosis not present

## 2022-07-31 DIAGNOSIS — R1310 Dysphagia, unspecified: Secondary | ICD-10-CM | POA: Diagnosis present

## 2022-07-31 DIAGNOSIS — Y838 Other surgical procedures as the cause of abnormal reaction of the patient, or of later complication, without mention of misadventure at the time of the procedure: Secondary | ICD-10-CM | POA: Diagnosis present

## 2022-07-31 DIAGNOSIS — I10 Essential (primary) hypertension: Secondary | ICD-10-CM | POA: Diagnosis present

## 2022-07-31 DIAGNOSIS — G959 Disease of spinal cord, unspecified: Secondary | ICD-10-CM

## 2022-07-31 DIAGNOSIS — Z801 Family history of malignant neoplasm of trachea, bronchus and lung: Secondary | ICD-10-CM | POA: Diagnosis not present

## 2022-07-31 DIAGNOSIS — M96841 Postprocedural hematoma of a musculoskeletal structure following other procedure: Secondary | ICD-10-CM | POA: Diagnosis not present

## 2022-07-31 DIAGNOSIS — Z01812 Encounter for preprocedural laboratory examination: Secondary | ICD-10-CM

## 2022-07-31 DIAGNOSIS — Z01818 Encounter for other preprocedural examination: Secondary | ICD-10-CM

## 2022-07-31 DIAGNOSIS — L7632 Postprocedural hematoma of skin and subcutaneous tissue following other procedure: Secondary | ICD-10-CM | POA: Diagnosis present

## 2022-07-31 DIAGNOSIS — Z8041 Family history of malignant neoplasm of ovary: Secondary | ICD-10-CM | POA: Diagnosis not present

## 2022-07-31 DIAGNOSIS — Z981 Arthrodesis status: Secondary | ICD-10-CM | POA: Diagnosis not present

## 2022-07-31 DIAGNOSIS — T8182XA Emphysema (subcutaneous) resulting from a procedure, initial encounter: Secondary | ICD-10-CM | POA: Diagnosis not present

## 2022-07-31 DIAGNOSIS — J9601 Acute respiratory failure with hypoxia: Secondary | ICD-10-CM | POA: Diagnosis not present

## 2022-07-31 DIAGNOSIS — Z8042 Family history of malignant neoplasm of prostate: Secondary | ICD-10-CM | POA: Diagnosis not present

## 2022-07-31 DIAGNOSIS — M4712 Other spondylosis with myelopathy, cervical region: Secondary | ICD-10-CM | POA: Diagnosis present

## 2022-07-31 DIAGNOSIS — T148XXA Other injury of unspecified body region, initial encounter: Secondary | ICD-10-CM | POA: Diagnosis present

## 2022-07-31 DIAGNOSIS — Z9911 Dependence on respirator [ventilator] status: Secondary | ICD-10-CM | POA: Diagnosis not present

## 2022-07-31 DIAGNOSIS — M2578 Osteophyte, vertebrae: Secondary | ICD-10-CM | POA: Diagnosis present

## 2022-07-31 DIAGNOSIS — M4322 Fusion of spine, cervical region: Secondary | ICD-10-CM | POA: Diagnosis not present

## 2022-07-31 DIAGNOSIS — I700 Atherosclerosis of aorta: Secondary | ICD-10-CM | POA: Diagnosis not present

## 2022-07-31 HISTORY — PX: ANTERIOR CERVICAL DECOMP/DISCECTOMY FUSION: SHX1161

## 2022-07-31 LAB — ABO/RH: ABO/RH(D): O POS

## 2022-07-31 SURGERY — ANTERIOR CERVICAL DECOMPRESSION/DISCECTOMY FUSION 1 LEVEL
Anesthesia: General | Site: Spine Cervical

## 2022-07-31 MED ORDER — PROPOFOL 500 MG/50ML IV EMUL
INTRAVENOUS | Status: DC | PRN
  Administered 2022-07-31: 125 ug/kg/min via INTRAVENOUS

## 2022-07-31 MED ORDER — ACETAMINOPHEN 10 MG/ML IV SOLN
INTRAVENOUS | Status: AC
  Filled 2022-07-31: qty 100

## 2022-07-31 MED ORDER — LIDOCAINE HCL (PF) 2 % IJ SOLN
INTRAMUSCULAR | Status: AC
  Filled 2022-07-31: qty 5

## 2022-07-31 MED ORDER — OXYCODONE HCL 5 MG/5ML PO SOLN: 5.0000 mg | Freq: Once | ORAL | Status: DC | PRN

## 2022-07-31 MED ORDER — FENTANYL CITRATE (PF) 100 MCG/2ML IJ SOLN: 25.0000 ug | INTRAMUSCULAR | Status: DC | PRN

## 2022-07-31 MED ORDER — CEFAZOLIN SODIUM-DEXTROSE 2-4 GM/100ML-% IV SOLN
2.0000 g | INTRAVENOUS | Status: AC
Start: 2022-07-31 — End: 2022-07-31
  Administered 2022-07-31: 2 g via INTRAVENOUS

## 2022-07-31 MED ORDER — POLYETHYLENE GLYCOL 3350 17 G PO PACK: 17.0000 g | PACK | Freq: Every day | ORAL | Status: DC | PRN

## 2022-07-31 MED ORDER — LABETALOL HCL 5 MG/ML IV SOLN: 5.0000 mg | INTRAVENOUS | Status: AC | PRN

## 2022-07-31 MED ORDER — MORPHINE SULFATE (PF) 2 MG/ML IV SOLN: 1.0000 mg | INTRAVENOUS | Status: DC | PRN

## 2022-07-31 MED ORDER — DEXAMETHASONE SODIUM PHOSPHATE 10 MG/ML IJ SOLN
INTRAMUSCULAR | Status: DC | PRN
  Administered 2022-07-31: 10 mg via INTRAVENOUS

## 2022-07-31 MED ORDER — CEFAZOLIN SODIUM-DEXTROSE 2-4 GM/100ML-% IV SOLN
INTRAVENOUS | Status: AC
  Filled 2022-07-31: qty 100

## 2022-07-31 MED ORDER — DEXAMETHASONE SODIUM PHOSPHATE 10 MG/ML IJ SOLN
INTRAMUSCULAR | Status: AC
Start: 2022-07-31 — End: ?
  Filled 2022-07-31: qty 1

## 2022-07-31 MED ORDER — DONEPEZIL HCL 5 MG PO TABS
5.0000 mg | ORAL_TABLET | Freq: Every day | ORAL | Status: DC
  Administered 2022-07-31: 5 mg via ORAL
  Filled 2022-07-31: qty 1

## 2022-07-31 MED ORDER — BUPIVACAINE HCL (PF) 0.5 % IJ SOLN
INTRAMUSCULAR | Status: AC
  Filled 2022-07-31: qty 30

## 2022-07-31 MED ORDER — LACTATED RINGERS IV SOLN
INTRAVENOUS | Status: DC
Start: 2022-07-31 — End: 2022-07-31

## 2022-07-31 MED ORDER — HYDROCHLOROTHIAZIDE 12.5 MG PO TABS
12.5000 mg | ORAL_TABLET | Freq: Every day | ORAL | Status: DC
  Administered 2022-07-31 – 2022-08-01 (×2): 12.5 mg via ORAL
  Filled 2022-07-31 (×2): qty 1

## 2022-07-31 MED ORDER — KETOROLAC TROMETHAMINE 30 MG/ML IJ SOLN
INTRAMUSCULAR | Status: DC | PRN
  Administered 2022-07-31: 30 mg via INTRAVENOUS

## 2022-07-31 MED ORDER — LIDOCAINE HCL (CARDIAC) PF 100 MG/5ML IV SOSY
PREFILLED_SYRINGE | INTRAVENOUS | Status: DC | PRN
  Administered 2022-07-31: 60 mg via INTRAVENOUS

## 2022-07-31 MED ORDER — BUPIVACAINE LIPOSOME 1.3 % IJ SUSP
INTRAMUSCULAR | Status: AC
  Filled 2022-07-31: qty 20

## 2022-07-31 MED ORDER — PROPOFOL 10 MG/ML IV BOLUS
INTRAVENOUS | Status: DC | PRN
  Administered 2022-07-31 (×2): 50 mg via INTRAVENOUS
  Administered 2022-07-31: 100 mg via INTRAVENOUS
  Administered 2022-07-31: 80 mg via INTRAVENOUS

## 2022-07-31 MED ORDER — ACETAMINOPHEN 500 MG PO TABS
1000.0000 mg | ORAL_TABLET | Freq: Four times a day (QID) | ORAL | Status: DC
  Administered 2022-07-31 – 2022-08-01 (×4): 1000 mg via ORAL
  Filled 2022-07-31 (×4): qty 2

## 2022-07-31 MED ORDER — MENTHOL 3 MG MT LOZG: 1.0000 | LOZENGE | OROMUCOSAL | Status: DC | PRN

## 2022-07-31 MED ORDER — LABETALOL HCL 5 MG/ML IV SOLN
5.0000 mg | INTRAVENOUS | Status: DC | PRN
  Administered 2022-07-31: 5 mg via INTRAVENOUS

## 2022-07-31 MED ORDER — ACETAMINOPHEN 10 MG/ML IV SOLN: 1000.0000 mg | Freq: Once | INTRAVENOUS | Status: DC | PRN

## 2022-07-31 MED ORDER — OXYCODONE HCL 5 MG PO TABS: 10.0000 mg | ORAL_TABLET | ORAL | Status: DC | PRN

## 2022-07-31 MED ORDER — CEFAZOLIN IN SODIUM CHLORIDE 2-0.9 GM/100ML-% IV SOLN
2.0000 g | Freq: Once | INTRAVENOUS | Status: DC
  Filled 2022-07-31: qty 100

## 2022-07-31 MED ORDER — BISACODYL 10 MG RE SUPP: 10.0000 mg | Freq: Every day | RECTAL | Status: DC | PRN

## 2022-07-31 MED ORDER — CHLORHEXIDINE GLUCONATE 0.12 % MT SOLN
OROMUCOSAL | Status: AC
  Administered 2022-07-31: 15 mL via OROMUCOSAL
  Filled 2022-07-31: qty 15

## 2022-07-31 MED ORDER — METHOCARBAMOL 1000 MG/10ML IJ SOLN: 500.0000 mg | Freq: Four times a day (QID) | INTRAVENOUS | Status: DC | PRN

## 2022-07-31 MED ORDER — BUPIVACAINE-EPINEPHRINE (PF) 0.5% -1:200000 IJ SOLN
INTRAMUSCULAR | Status: AC
  Filled 2022-07-31: qty 30

## 2022-07-31 MED ORDER — FENTANYL CITRATE (PF) 100 MCG/2ML IJ SOLN
INTRAMUSCULAR | Status: DC | PRN
  Administered 2022-07-31: 50 ug via INTRAVENOUS
  Administered 2022-07-31 (×2): 100 ug via INTRAVENOUS

## 2022-07-31 MED ORDER — PROPOFOL 1000 MG/100ML IV EMUL
INTRAVENOUS | Status: AC
  Filled 2022-07-31: qty 100

## 2022-07-31 MED ORDER — METHOCARBAMOL 500 MG PO TABS
500.0000 mg | ORAL_TABLET | Freq: Four times a day (QID) | ORAL | Status: DC | PRN
  Filled 2022-07-31: qty 1

## 2022-07-31 MED ORDER — FENTANYL CITRATE (PF) 250 MCG/5ML IJ SOLN
INTRAMUSCULAR | Status: AC
  Filled 2022-07-31: qty 5

## 2022-07-31 MED ORDER — ONDANSETRON HCL 4 MG/2ML IJ SOLN
INTRAMUSCULAR | Status: AC
  Filled 2022-07-31: qty 2

## 2022-07-31 MED ORDER — EPHEDRINE 5 MG/ML INJ
INTRAVENOUS | Status: AC
  Filled 2022-07-31: qty 5

## 2022-07-31 MED ORDER — SODIUM CHLORIDE FLUSH 0.9 % IV SOLN
INTRAVENOUS | Status: AC
Start: 2022-07-31 — End: ?
  Filled 2022-07-31: qty 20

## 2022-07-31 MED ORDER — LABETALOL HCL 5 MG/ML IV SOLN
INTRAVENOUS | Status: AC
  Administered 2022-07-31: 5 mg via INTRAVENOUS
  Filled 2022-07-31: qty 4

## 2022-07-31 MED ORDER — SODIUM CHLORIDE 0.9 % IV SOLN: 250.0000 mL | INTRAVENOUS | Status: DC

## 2022-07-31 MED ORDER — OXYCODONE HCL 5 MG PO TABS: 5.0000 mg | ORAL_TABLET | Freq: Once | ORAL | Status: DC | PRN

## 2022-07-31 MED ORDER — SODIUM CHLORIDE 0.9 % IV SOLN: INTRAVENOUS | Status: DC

## 2022-07-31 MED ORDER — OXYCODONE HCL 5 MG PO TABS
5.0000 mg | ORAL_TABLET | ORAL | Status: DC | PRN
  Administered 2022-07-31: 5 mg via ORAL
  Filled 2022-07-31: qty 1

## 2022-07-31 MED ORDER — REMIFENTANIL HCL 1 MG IV SOLR
INTRAVENOUS | Status: AC
  Filled 2022-07-31: qty 1000

## 2022-07-31 MED ORDER — KETOROLAC TROMETHAMINE 15 MG/ML IJ SOLN
7.5000 mg | Freq: Four times a day (QID) | INTRAMUSCULAR | Status: DC
  Administered 2022-07-31 – 2022-08-01 (×2): 7.5 mg via INTRAVENOUS
  Filled 2022-07-31 (×4): qty 1

## 2022-07-31 MED ORDER — POTASSIUM CHLORIDE CRYS ER 10 MEQ PO TBCR
10.0000 meq | EXTENDED_RELEASE_TABLET | Freq: Two times a day (BID) | ORAL | Status: DC
  Administered 2022-07-31 – 2022-08-01 (×2): 10 meq via ORAL
  Filled 2022-07-31 (×3): qty 1

## 2022-07-31 MED ORDER — ONDANSETRON HCL 4 MG/2ML IJ SOLN: 4.0000 mg | Freq: Four times a day (QID) | INTRAMUSCULAR | Status: DC | PRN

## 2022-07-31 MED ORDER — ONDANSETRON HCL 4 MG/2ML IJ SOLN
INTRAMUSCULAR | Status: DC | PRN
  Administered 2022-07-31: 4 mg via INTRAVENOUS

## 2022-07-31 MED ORDER — 0.9 % SODIUM CHLORIDE (POUR BTL) OPTIME
TOPICAL | Status: DC | PRN
  Administered 2022-07-31: 1000 mL

## 2022-07-31 MED ORDER — ONDANSETRON HCL 4 MG PO TABS: 4.0000 mg | ORAL_TABLET | Freq: Four times a day (QID) | ORAL | Status: DC | PRN

## 2022-07-31 MED ORDER — ORAL CARE MOUTH RINSE: 15.0000 mL | Freq: Once | OROMUCOSAL | Status: AC

## 2022-07-31 MED ORDER — SIMVASTATIN 20 MG PO TABS
20.0000 mg | ORAL_TABLET | Freq: Every day | ORAL | Status: DC
  Administered 2022-07-31: 20 mg via ORAL
  Filled 2022-07-31: qty 1

## 2022-07-31 MED ORDER — AMLODIPINE BESYLATE 5 MG PO TABS
37.5000 mg | ORAL_TABLET | Freq: Every day | ORAL | Status: DC
  Administered 2022-08-01: 37.5 mg via ORAL
  Filled 2022-07-31: qty 2

## 2022-07-31 MED ORDER — SODIUM CHLORIDE 0.9% FLUSH: 3.0000 mL | INTRAVENOUS | Status: DC | PRN

## 2022-07-31 MED ORDER — BUPIVACAINE-EPINEPHRINE (PF) 0.5% -1:200000 IJ SOLN
INTRAMUSCULAR | Status: DC | PRN
  Administered 2022-07-31: 8 mL

## 2022-07-31 MED ORDER — SURGIFLO WITH THROMBIN (HEMOSTATIC MATRIX KIT) OPTIME
TOPICAL | Status: DC | PRN
  Administered 2022-07-31: 1 via TOPICAL

## 2022-07-31 MED ORDER — KETOROLAC TROMETHAMINE 30 MG/ML IJ SOLN
INTRAMUSCULAR | Status: AC
  Filled 2022-07-31: qty 1

## 2022-07-31 MED ORDER — CHLORHEXIDINE GLUCONATE 0.12 % MT SOLN: 15.0000 mL | Freq: Once | OROMUCOSAL | Status: AC

## 2022-07-31 MED ORDER — SUCCINYLCHOLINE CHLORIDE 200 MG/10ML IV SOSY
PREFILLED_SYRINGE | INTRAVENOUS | Status: DC | PRN
  Administered 2022-07-31: 80 mg via INTRAVENOUS

## 2022-07-31 MED ORDER — ASCORBIC ACID 500 MG PO TABS
250.0000 mg | ORAL_TABLET | Freq: Every day | ORAL | Status: DC
  Administered 2022-08-01: 250 mg via ORAL
  Filled 2022-07-31: qty 1

## 2022-07-31 MED ORDER — ONDANSETRON HCL 4 MG/2ML IJ SOLN: 4.0000 mg | Freq: Once | INTRAMUSCULAR | Status: DC | PRN

## 2022-07-31 MED ORDER — SODIUM CHLORIDE 0.9% FLUSH
3.0000 mL | Freq: Two times a day (BID) | INTRAVENOUS | Status: DC
  Administered 2022-07-31: 3 mL via INTRAVENOUS

## 2022-07-31 MED ORDER — TIMOLOL MALEATE 0.25 % OP SOLN
1.0000 [drp] | Freq: Every day | OPHTHALMIC | Status: DC
  Administered 2022-08-01: 1 [drp] via OPHTHALMIC
  Filled 2022-07-31: qty 5

## 2022-07-31 MED ORDER — DOCUSATE SODIUM 100 MG PO CAPS
100.0000 mg | ORAL_CAPSULE | Freq: Two times a day (BID) | ORAL | Status: DC
  Administered 2022-07-31 – 2022-08-01 (×2): 100 mg via ORAL
  Filled 2022-07-31 (×2): qty 1

## 2022-07-31 MED ORDER — PHENOL 1.4 % MT LIQD: 1.0000 | OROMUCOSAL | Status: DC | PRN

## 2022-07-31 MED ORDER — FLEET ENEMA 7-19 GM/118ML RE ENEM: 1.0000 | ENEMA | Freq: Once | RECTAL | Status: DC | PRN

## 2022-07-31 MED ORDER — EPHEDRINE SULFATE (PRESSORS) 50 MG/ML IJ SOLN
INTRAMUSCULAR | Status: DC | PRN
  Administered 2022-07-31: 10 mg via INTRAVENOUS

## 2022-07-31 MED ORDER — REMIFENTANIL HCL 1 MG IV SOLR
INTRAVENOUS | Status: DC | PRN
  Administered 2022-07-31: .2 ug/kg/min via INTRAVENOUS

## 2022-07-31 MED ORDER — SENNA 8.6 MG PO TABS
1.0000 | ORAL_TABLET | Freq: Two times a day (BID) | ORAL | Status: DC
  Administered 2022-07-31 – 2022-08-01 (×2): 8.6 mg via ORAL
  Filled 2022-07-31 (×2): qty 1

## 2022-07-31 MED ORDER — ACETAMINOPHEN 10 MG/ML IV SOLN
INTRAVENOUS | Status: DC | PRN
  Administered 2022-07-31: 1000 mg via INTRAVENOUS

## 2022-07-31 MED ORDER — OYSTER SHELL CALCIUM/D3 500-5 MG-MCG PO TABS
1.0000 | ORAL_TABLET | Freq: Two times a day (BID) | ORAL | Status: DC
  Administered 2022-07-31 – 2022-08-01 (×2): 1 via ORAL
  Filled 2022-07-31 (×2): qty 1

## 2022-07-31 MED ORDER — SUCCINYLCHOLINE CHLORIDE 200 MG/10ML IV SOSY
PREFILLED_SYRINGE | INTRAVENOUS | Status: AC
  Filled 2022-07-31: qty 10

## 2022-07-31 MED ORDER — VALSARTAN-HYDROCHLOROTHIAZIDE 160-12.5 MG PO TABS: 1.0000 | ORAL_TABLET | Freq: Every day | ORAL | Status: DC

## 2022-07-31 MED ORDER — ENOXAPARIN SODIUM 40 MG/0.4ML IJ SOSY
40.0000 mg | PREFILLED_SYRINGE | INTRAMUSCULAR | Status: DC
  Administered 2022-08-01: 40 mg via SUBCUTANEOUS
  Filled 2022-07-31: qty 0.4

## 2022-07-31 MED ORDER — IRBESARTAN 150 MG PO TABS
150.0000 mg | ORAL_TABLET | Freq: Every day | ORAL | Status: DC
  Administered 2022-07-31 – 2022-08-01 (×2): 150 mg via ORAL
  Filled 2022-07-31 (×2): qty 1

## 2022-07-31 SURGICAL SUPPLY — 62 items
ADH SKN CLS APL DERMABOND .7 (GAUZE/BANDAGES/DRESSINGS) ×1
AGENT HMST KT MTR STRL THRMB (HEMOSTASIS) ×1
ALLOGRAFT BONE FIBER KORE 1CC (Bone Implant) ×1 IMPLANT
BASIN KIT SINGLE STR (MISCELLANEOUS) ×2 IMPLANT
BASKET BONE COLLECTION (BASKET) IMPLANT
BULB RESERV EVAC DRAIN JP 100C (MISCELLANEOUS) IMPLANT
BUR NEURO DRILL SOFT 3.0X3.8M (BURR) ×2 IMPLANT
COUNTER NEEDLE 20/40 LG (NEEDLE) ×2 IMPLANT
CUP MEDICINE 2OZ PLAST GRAD ST (MISCELLANEOUS) ×2 IMPLANT
DERMABOND ×1 IMPLANT
DERMABOND ADVANCED (GAUZE/BANDAGES/DRESSINGS) ×1
DERMABOND ADVANCED .7 DNX12 (GAUZE/BANDAGES/DRESSINGS) ×1 IMPLANT
DRAIN CHANNEL JP 10F RND 20C F (MISCELLANEOUS) IMPLANT
DRAPE C ARM PK CFD 31 SPINE (DRAPES) ×2 IMPLANT
DRAPE LAPAROTOMY 77X122 PED (DRAPES) ×2 IMPLANT
DRAPE MICROSCOPE SPINE 48X150 (DRAPES) ×2 IMPLANT
DRAPE SURG 17X11 SM STRL (DRAPES) ×2 IMPLANT
ELECT CAUTERY BLADE TIP 2.5 (TIP) ×2
ELECT REM PT RETURN 9FT ADLT (ELECTROSURGICAL) ×2
ELECTRODE CAUTERY BLDE TIP 2.5 (TIP) ×1 IMPLANT
ELECTRODE REM PT RTRN 9FT ADLT (ELECTROSURGICAL) ×1 IMPLANT
FEE INTRAOP CADWELL SUPPLY NCS (MISCELLANEOUS) IMPLANT
FEE INTRAOP MONITOR IMPULS NCS (MISCELLANEOUS) IMPLANT
GLOVE BIOGEL PI IND STRL 6.5 (GLOVE) ×1 IMPLANT
GLOVE BIOGEL PI IND STRL 8.5 (GLOVE) ×1 IMPLANT
GLOVE BIOGEL PI INDICATOR 6.5 (GLOVE) ×1
GLOVE BIOGEL PI INDICATOR 8.5 (GLOVE) ×1
GLOVE SURG SYN 6.5 ES PF (GLOVE) ×2 IMPLANT
GLOVE SURG SYN 6.5 PF PI (GLOVE) ×1 IMPLANT
GLOVE SURG SYN 8.5  E (GLOVE) ×3
GLOVE SURG SYN 8.5 E (GLOVE) ×3 IMPLANT
GLOVE SURG SYN 8.5 PF PI (GLOVE) ×3 IMPLANT
GOWN SRG LRG LVL 4 IMPRV REINF (GOWNS) ×1 IMPLANT
GOWN SRG XL LVL 3 NONREINFORCE (GOWNS) ×1 IMPLANT
GOWN STRL NON-REIN TWL XL LVL3 (GOWNS) ×2
GOWN STRL REIN LRG LVL4 (GOWNS) ×2
GRADUATE 1200CC STRL 31836 (MISCELLANEOUS) ×2 IMPLANT
INTRAOP CADWELL SUPPLY FEE NCS (MISCELLANEOUS) ×1
INTRAOP DISP SUPPLY FEE NCS (MISCELLANEOUS) ×2
INTRAOP MONITOR FEE IMPULS NCS (MISCELLANEOUS) ×1
INTRAOP MONITOR FEE IMPULSE (MISCELLANEOUS) ×1
KIT TURNOVER KIT A (KITS) ×2 IMPLANT
MANIFOLD NEPTUNE II (INSTRUMENTS) ×2 IMPLANT
MARKER SKIN DUAL TIP RULER LAB (MISCELLANEOUS) ×3 IMPLANT
NDL SAFETY ECLIPSE 18X1.5 (NEEDLE) ×1 IMPLANT
NEEDLE HYPO 18GX1.5 SHARP (NEEDLE) ×2
NS IRRIG 1000ML POUR BTL (IV SOLUTION) ×2 IMPLANT
PACK LAMINECTOMY NEURO (CUSTOM PROCEDURE TRAY) ×2 IMPLANT
PAD ARMBOARD 7.5X6 YLW CONV (MISCELLANEOUS) ×4 IMPLANT
PIN CASPAR 14 (PIN) ×1 IMPLANT
PIN CASPAR 14MM (PIN) ×2
PLATE ANT CERV XTEND 1 LV 12 (Plate) ×1 IMPLANT
SCREW VAR 4.2 XD SELF DRILL 16 (Screw) ×4 IMPLANT
SPACER C HEDRON 12X14 7M 7D (Spacer) ×1 IMPLANT
SPONGE KITTNER 5P (MISCELLANEOUS) ×2 IMPLANT
STAPLER SKIN PROX 35W (STAPLE) IMPLANT
SURGIFLO W/THROMBIN 8M KIT (HEMOSTASIS) ×2 IMPLANT
SUT V-LOC 90 ABS DVC 3-0 CL (SUTURE) ×2 IMPLANT
SUT VIC AB 3-0 SH 8-18 (SUTURE) ×2 IMPLANT
SYR 30ML LL (SYRINGE) ×2 IMPLANT
TAPE CLOTH 3X10 WHT NS LF (GAUZE/BANDAGES/DRESSINGS) ×5 IMPLANT
WATER STERILE IRR 1000ML POUR (IV SOLUTION) ×3 IMPLANT

## 2022-07-31 NOTE — Anesthesia Procedure Notes (Signed)
Procedure Name: Intubation Date/Time: 07/31/2022 10:12 AM  Performed by: Esaw Grandchild, CRNAPre-anesthesia Checklist: Patient identified, Emergency Drugs available, Suction available and Patient being monitored Patient Re-evaluated:Patient Re-evaluated prior to induction Oxygen Delivery Method: Circle system utilized Preoxygenation: Pre-oxygenation with 100% oxygen Induction Type: IV induction Ventilation: Mask ventilation without difficulty Laryngoscope Size: McGraph and 3 Grade View: Grade I Tube type: Oral Tube size: 6.5 mm Number of attempts: 1 Airway Equipment and Method: Stylet, Oral airway, LTA kit utilized and Bite block Placement Confirmation: ETT inserted through vocal cords under direct vision, positive ETCO2 and breath sounds checked- equal and bilateral Secured at: 20 cm Tube secured with: Tape Dental Injury: Teeth and Oropharynx as per pre-operative assessment  Comments: Pt's neck maintained neutral/midline position throughout induction/intubation, Pt tolerated well

## 2022-07-31 NOTE — Op Note (Addendum)
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 she failed conservative management, and elected to proceed with surgery.  Findings: large central disc herniation  Preoperative Diagnosis: Cervical myelopathy, cervical stenosis Postoperative Diagnosis: same   EBL: 50 ml IVF: see AR ml Drains: none Disposition: Extubated and Stable to PACU Complications: none  No foley catheter was placed.   Preoperative Note:   Risks of surgery discussed include: infection, bleeding, stroke, coma, death, paralysis, CSF leak, nerve/spinal cord injury, numbness, tingling, weakness, complex regional pain syndrome, recurrent stenosis and/or disc herniation, vascular injury, development of instability, neck/back pain, need for further surgery, persistent symptoms, development of deformity, and the risks of anesthesia. The patient understood these risks and agreed to proceed.  Operative Note:   Procedure:  1) Anterior cervical diskectomy and fusion at C3-4 2) Anterior cervical instrumentation at C3-4 using Globus Xtend 3) Insertion of biomechanical device at C3-4 (Globus Hedron C)   Procedure: After obtaining informed consent, the patient taken to the operating room, placed in supine position, general anesthesia induced.  The patient had a small shoulder roll placed behind their shoulders.  The patient received preop antibiotics and IV Decadron.  The patient had a neck incision outlined, was prepped and draped in usual sterile fashion. The incision was injected with local anesthetic.   An incision was opened, dissection taken down medial to the carotid artery and jugular vein, lateral to the trachea and esophagus.  The prevertebral fascia was identified, and a localizing x-ray demonstrated the correct level.  The longus colli were dissected laterally, and self-retaining retractors placed to open the operative field. The microscope was then brought into the  field.  With this complete, distractor pins were placed in the vertebral bodies of C3 and C4. The distractor was placed, and the annulus at C3/4 was opened using a bovie.  Curettes and pituitary rongeurs used to remove the majority of disk, then the drill was used to remove the posterior osteophyte and begin the foraminotomies. The nerve hook was used to elevate the posterior longitudinal ligament, which was then removed with Kerrison rongeurs. Bilateral foraminotomies were performed. The microblunt nerve hook could be passed out the foramen bilaterally.   Meticulous hemostasis was obtained.  A biomechanical device (Globus Hedron C 7 mm height x 14 mm width by 12 mm depth) was placed at C3/4. The device had been filled with allograft for aid in arthrodesis.  The caspar distractor was removed, and bone wax used for hemostasis. A separate, 12 mm Globus Xtend plate was chosen.  Two screws placed in each vertebral body, respectively making sure the screws were behind the locking mechanism.  Final AP and lateral radiographs were taken.   Please note that the plate is not inclusive to the biomechanical device.  The anchoring mechanism of the plate is completely separate from the biomechanical device.  With everything in good position, the wound was irrigated copiously and meticulous hemostasis obtained.  Wound was closed in 2 layers using interrupted inverted 3-0 Vicryl sutures.  The wound was dressed with dermabond, the head of bed at 30 degrees, taken to recovery room in stable condition.  No new postop neurological deficits were identified.  Sponge and pattie counts were correct at the end of the procedure.   I performed the entire procedure with the assistance of Cooper Render PA as an Pensions consultant. An assistant was required for this procedure due to the complexity.  The assistant provided assistance in tissue manipulation and suction,  and was required for the successful and safe performance of the  procedure. I performed the critical portions of the procedure.   Monitoring improved after decompression.  Meade Maw MD

## 2022-07-31 NOTE — Transfer of Care (Signed)
Immediate Anesthesia Transfer of Care Note  Patient: Tina Johnston  Procedure(s) Performed: C3-4 ANTERIOR CERVICAL DISCECTOMY AND FUSION (GLOBUS HEDRON) (Spine Cervical)  Patient Location: PACU  Anesthesia Type:General  Level of Consciousness: drowsy  Airway & Oxygen Therapy: Patient Spontanous Breathing and Patient connected to face mask oxygen  Post-op Assessment: Report given to RN and Post -op Vital signs reviewed and stable  Post vital signs: Reviewed and stable  Last Vitals:  Vitals Value Taken Time  BP 185/96 07/31/22 1216  Temp    Pulse 78 07/31/22 1220  Resp 14 07/31/22 1220  SpO2 99 % 07/31/22 1220  Vitals shown include unvalidated device data.  Last Pain:  Vitals:   07/31/22 0913  TempSrc: Temporal  PainSc: 0-No pain         Complications: No notable events documented.

## 2022-07-31 NOTE — Plan of Care (Signed)

## 2022-07-31 NOTE — Interval H&P Note (Signed)
History and Physical Interval Note:  07/31/2022 9:19 AM  Tina Johnston  has presented today for surgery, with the diagnosis of Cervical myelopathy G95.9 Cervical stenosis of spinal canal M48.02.  The various methods of treatment have been discussed with the patient and family. After consideration of risks, benefits and other options for treatment, the patient has consented to  Procedure(s): C3-4 ANTERIOR CERVICAL DISCECTOMY AND FUSION (GLOBUS HEDRON) (N/A) as a surgical intervention.  The patient's history has been reviewed, patient examined, no change in status, stable for surgery.  I have reviewed the patient's chart and labs.  Questions were answered to the patient's satisfaction.    Heart sounds normal no MRG. Chest Clear to Auscultation Bilaterally.   [name]

## 2022-07-31 NOTE — Anesthesia Postprocedure Evaluation (Signed)
Anesthesia Post Note  Patient: VERNAL RUTAN  Procedure(s) Performed: C3-4 ANTERIOR CERVICAL DISCECTOMY AND FUSION (GLOBUS HEDRON) (Spine Cervical)  Patient location during evaluation: PACU Anesthesia Type: General Level of consciousness: awake and alert Pain management: pain level controlled Vital Signs Assessment: post-procedure vital signs reviewed and stable Respiratory status: spontaneous breathing, nonlabored ventilation, respiratory function stable and patient connected to nasal cannula oxygen Cardiovascular status: blood pressure returned to baseline and stable Postop Assessment: no apparent nausea or vomiting Anesthetic complications: no   No notable events documented.   Last Vitals:  Vitals:   07/31/22 1345 07/31/22 1358  BP: (!) 159/75   Pulse: 61   Resp: 13   Temp: (!) 34.7 C (!) 36.3 C  SpO2: 95%     Last Pain:  Vitals:   07/31/22 1400  TempSrc:   PainSc: 0-No pain                 Arita Miss

## 2022-07-31 NOTE — TOC Progression Note (Signed)
Transition of Care [facility]) - Progression Note    Patient Details  Name: Tina Johnston MRN: [id number] Date of Birth: Aug 18, 1941  Transition of Care [facility]) CM/SW Buda, RN Phone Number: 07/31/2022, 3:06 PM  Clinical Narrative:     Tina Johnston is set up for [facility] services       Expected Discharge Plan and Services                                                 Social Determinants of Health (SDOH) Interventions    Readmission Risk Interventions     No data to display

## 2022-07-31 NOTE — Anesthesia Preprocedure Evaluation (Signed)
Anesthesia Evaluation  Patient identified by MRN, date of birth, ID band Patient awake    Reviewed: Allergy & Precautions, NPO status , Patient's Chart, lab work & pertinent test results  History of Anesthesia Complications Negative for: history of anesthetic complications  Airway Mallampati: III  TM Distance: >3 FB Neck ROM: Full    Dental  (+) Upper Dentures, Lower Dentures   Pulmonary neg pulmonary ROS, neg sleep apnea, neg COPD, Patient abstained from smoking.Not current smoker,    Pulmonary exam normal breath sounds clear to auscultation       Cardiovascular Exercise Tolerance: Good METShypertension, Pt. on medications (-) CAD and (-) Past MI (-) dysrhythmias  Rhythm:Regular Rate:Normal - Systolic murmurs    Neuro/Psych negative neurological ROS  negative psych ROS   GI/Hepatic neg GERD  ,(+)     (-) substance abuse  ,   Endo/Other  neg diabetes  Renal/GU negative Renal ROS     Musculoskeletal  (+) Arthritis , Osteoarthritis,    Abdominal   Peds  Hematology   Anesthesia Other Findings Past Medical History: No date: Arthritis 2014: Breast cancer ([place])     Comment:  Bilateral- radiation No date: Breast cyst No date: Family history of breast cancer No date: Family history of lung cancer No date: Family history of ovarian cancer No date: Family history of prostate cancer No date: Hyperlipidemia No date: Hypertension No date: Personal history of breast cancer 2014: Personal history of radiation therapy     Comment:  Bilateral lumpectomy  Reproductive/Obstetrics                             Anesthesia Physical Anesthesia Plan  ASA: 2  Anesthesia Plan: General   Post-op Pain Management: Ofirmev IV (intra-op)*   Induction: Intravenous  PONV Risk Score and Plan: 4 or greater and Ondansetron, Dexamethasone, Propofol infusion, TIVA and Treatment may vary due to age or medical  condition  Airway Management Planned: Oral ETT  Additional Equipment: None  Intra-op Plan:   Post-operative Plan: Extubation in OR  Informed Consent: I have reviewed the patients History and Physical, chart, labs and discussed the procedure including the risks, benefits and alternatives for the proposed anesthesia with the patient or authorized representative who has indicated his/her understanding and acceptance.     Dental advisory given  Plan Discussed with: CRNA and Surgeon  Anesthesia Plan Comments: (Discussed risks of anesthesia with patient, including PONV, sore throat, lip/dental/eye damage, post operative cognitive dysfunction. Rare risks discussed as well, such as cardiorespiratory and neurological sequelae, and allergic reactions. Discussed the role of CRNA in patient's perioperative care. Patient understands.)        Anesthesia Quick Evaluation

## 2022-08-01 ENCOUNTER — Emergency Department: Payer: Medicare HMO

## 2022-08-01 ENCOUNTER — Inpatient Hospital Stay
Admission: EM | Admit: 2022-08-01 | Discharge: 2022-08-04 | DRG: 908 | Disposition: A | Discharge status: Home Health | Payer: Medicare HMO | Attending: Internal Medicine | Admitting: Internal Medicine

## 2022-08-01 ENCOUNTER — Encounter: Payer: Self-pay | Admitting: Neurosurgery

## 2022-08-01 DIAGNOSIS — Z803 Family history of malignant neoplasm of breast: Secondary | ICD-10-CM

## 2022-08-01 DIAGNOSIS — M4802 Spinal stenosis, cervical region: Secondary | ICD-10-CM | POA: Diagnosis present

## 2022-08-01 DIAGNOSIS — L7632 Postprocedural hematoma of skin and subcutaneous tissue following other procedure: Principal | ICD-10-CM | POA: Diagnosis present

## 2022-08-01 DIAGNOSIS — Z8249 Family history of ischemic heart disease and other diseases of the circulatory system: Secondary | ICD-10-CM

## 2022-08-01 DIAGNOSIS — G3184 Mild cognitive impairment, so stated: Secondary | ICD-10-CM | POA: Diagnosis present

## 2022-08-01 DIAGNOSIS — R1310 Dysphagia, unspecified: Secondary | ICD-10-CM | POA: Diagnosis present

## 2022-08-01 DIAGNOSIS — S1093XA Contusion of unspecified part of neck, initial encounter: Secondary | ICD-10-CM | POA: Diagnosis present

## 2022-08-01 DIAGNOSIS — M2578 Osteophyte, vertebrae: Secondary | ICD-10-CM | POA: Diagnosis present

## 2022-08-01 DIAGNOSIS — Z8041 Family history of malignant neoplasm of ovary: Secondary | ICD-10-CM

## 2022-08-01 DIAGNOSIS — T148XXA Other injury of unspecified body region, initial encounter: Principal | ICD-10-CM

## 2022-08-01 DIAGNOSIS — E785 Hyperlipidemia, unspecified: Secondary | ICD-10-CM | POA: Diagnosis present

## 2022-08-01 DIAGNOSIS — Y838 Other surgical procedures as the cause of abnormal reaction of the patient, or of later complication, without mention of misadventure at the time of the procedure: Secondary | ICD-10-CM | POA: Diagnosis present

## 2022-08-01 DIAGNOSIS — Z90710 Acquired absence of both cervix and uterus: Secondary | ICD-10-CM

## 2022-08-01 DIAGNOSIS — J398 Other specified diseases of upper respiratory tract: Secondary | ICD-10-CM | POA: Diagnosis present

## 2022-08-01 DIAGNOSIS — Z8042 Family history of malignant neoplasm of prostate: Secondary | ICD-10-CM

## 2022-08-01 DIAGNOSIS — Z885 Allergy status to narcotic agent status: Secondary | ICD-10-CM

## 2022-08-01 DIAGNOSIS — Z79899 Other long term (current) drug therapy: Secondary | ICD-10-CM

## 2022-08-01 DIAGNOSIS — Z923 Personal history of irradiation: Secondary | ICD-10-CM

## 2022-08-01 DIAGNOSIS — Z853 Personal history of malignant neoplasm of breast: Secondary | ICD-10-CM

## 2022-08-01 DIAGNOSIS — M4712 Other spondylosis with myelopathy, cervical region: Secondary | ICD-10-CM | POA: Diagnosis present

## 2022-08-01 DIAGNOSIS — I10 Essential (primary) hypertension: Secondary | ICD-10-CM | POA: Diagnosis present

## 2022-08-01 DIAGNOSIS — Z801 Family history of malignant neoplasm of trachea, bronchus and lung: Secondary | ICD-10-CM

## 2022-08-01 DIAGNOSIS — M5001 Cervical disc disorder with myelopathy,  high cervical region: Secondary | ICD-10-CM | POA: Diagnosis present

## 2022-08-01 LAB — CBC WITH DIFFERENTIAL/PLATELET
Abs Immature Granulocytes: 0.06 10*3/uL (ref 0.00–0.07)
Basophils Absolute: 0 10*3/uL (ref 0.0–0.1)
Basophils Relative: 0 %
Eosinophils Absolute: 0 10*3/uL (ref 0.0–0.5)
Eosinophils Relative: 0 %
HCT: 38.3 % (ref 36.0–46.0)
Hemoglobin: 12.3 g/dL (ref 12.0–15.0)
Immature Granulocytes: 0 %
Lymphocytes Relative: 13 %
Lymphs Abs: 2 10*3/uL (ref 0.7–4.0)
MCH: 27.8 pg (ref 26.0–34.0)
MCHC: 32.1 g/dL (ref 30.0–36.0)
MCV: 86.5 fL (ref 80.0–100.0)
Monocytes Absolute: 1.1 10*3/uL — ABNORMAL HIGH (ref 0.1–1.0)
Monocytes Relative: 7 %
Neutro Abs: 12 10*3/uL — ABNORMAL HIGH (ref 1.7–7.7)
Neutrophils Relative %: 80 %
Platelets: 406 10*3/uL — ABNORMAL HIGH (ref 150–400)
RBC: 4.43 MIL/uL (ref 3.87–5.11)
RDW: 15.2 % (ref 11.5–15.5)
WBC: 15.2 10*3/uL — ABNORMAL HIGH (ref 4.0–10.5)
nRBC: 0 % (ref 0.0–0.2)

## 2022-08-01 LAB — COMPREHENSIVE METABOLIC PANEL
ALT: 21 U/L (ref 0–44)
AST: 27 U/L (ref 15–41)
Albumin: 4 g/dL (ref 3.5–5.0)
Alkaline Phosphatase: 73 U/L (ref 38–126)
Anion gap: 7 (ref 5–15)
BUN: 15 mg/dL (ref 8–23)
CO2: 26 mmol/L (ref 22–32)
Calcium: 9.9 mg/dL (ref 8.9–10.3)
Chloride: 105 mmol/L (ref 98–111)
Creatinine, Ser: 0.82 mg/dL (ref 0.44–1.00)
GFR, Estimated: >60 mL/min (ref >60)
Glucose, Bld: 147 mg/dL — ABNORMAL HIGH (ref 70–99)
Potassium: 3.4 mmol/L — ABNORMAL LOW (ref 3.5–5.1)
Sodium: 138 mmol/L (ref 135–145)
Total Bilirubin: 0.5 mg/dL (ref 0.3–1.2)
Total Protein: 7.6 g/dL (ref 6.5–8.1)

## 2022-08-01 MED ORDER — METHOCARBAMOL 500 MG PO TABS
500.0000 mg | ORAL_TABLET | Freq: Three times a day (TID) | ORAL | 0 refills | Status: DC | PRN
Start: 2022-08-01 — End: 2022-08-15

## 2022-08-01 MED ORDER — IOHEXOL 300 MG/ML  SOLN
75.0000 mL | Freq: Once | INTRAMUSCULAR | Status: AC | PRN
  Administered 2022-08-01: 75 mL via INTRAVENOUS

## 2022-08-01 MED ORDER — DEXAMETHASONE SODIUM PHOSPHATE 10 MG/ML IJ SOLN
10.0000 mg | Freq: Once | INTRAMUSCULAR | Status: AC
  Administered 2022-08-01: 10 mg via INTRAVENOUS
  Filled 2022-08-01: qty 1

## 2022-08-01 MED ORDER — SENNA 8.6 MG PO TABS: 1.0000 | ORAL_TABLET | Freq: Every day | ORAL | 0 refills | Status: AC | PRN

## 2022-08-01 MED ORDER — OXYCODONE HCL 5 MG PO TABS: 5.0000 mg | ORAL_TABLET | ORAL | 0 refills | Status: AC | PRN

## 2022-08-01 NOTE — Progress Notes (Signed)
Physical Therapy Treatment Patient Details Name: YILIN WEEDON MRN: [id number] DOB: [birth year] Today's Date: 08/01/2022   History of Present Illness ELODY KLEINSASSER is a 81 y/o female who presented on 07/31/22 for surgery, with the diagnosis of Cervical myelopathy   Cervical stenosis of spinal canal.  The various methods of treatment have been discussed with the patient and family. After consideration of risks, benefits and other options for treatment, the patient has consented to  Procedure(s):  C3-4 ANTERIOR CERVICAL DISCECTOMY AND FUSION (GLOBUS HEDRON) (N/A) as a surgical intervention.  The patient's history has been reviewed, patient examined, no change in status, stable for surgery. Pt is now 1) Anterior cervical diskectomy and fusion at C3-4  2) Anterior cervical instrumentation at C3-4 using Globus Xtend  3) Insertion of biomechanical device at C3-4 (Globus Hedron C).    PT Comments    Pt received in bed agreeable to attempt steps. Pt Independently got OOB and ambulated to the steps with RW with Sup of 1. Ascended and descended 4 steps twice with sup of 1 using R HR. Pt has 24 hour sup after acute care. PT recommends HHPT for safe household level and community level activity participation.  .  Recommendations for follow up therapy are one component of a multi-disciplinary discharge planning process, led by the attending physician.  Recommendations may be updated based on patient status, additional functional criteria and insurance authorization.  Follow Up Recommendations  Home health PT     Assistance Recommended at Discharge Intermittent Supervision/Assistance  Patient can return home with the following A little help with walking and/or transfers;A little help with bathing/dressing/bathroom;Assistance with cooking/housework;Assistance with feeding;Assist for transportation;Help with stairs or ramp for entrance   Equipment Recommendations  Rolling walker (2 wheels);BSC/3in1     Recommendations for Other Services       Precautions / Restrictions Precautions Precautions: Fall;Cervical Precaution Comments: no bending, lifting, twisting (pt provided with the C/S precaution handout.) Restrictions Weight Bearing Restrictions: No     Mobility  Bed Mobility Overal bed mobility: Independent                  Transfers Overall transfer level: Modified independent Equipment used: Rolling walker (2 wheels) Transfers: Sit to/from Stand Sit to Stand: Modified independent (Device/Increase time)   Step pivot transfers: Supervision            Ambulation/Gait Ambulation/Gait assistance: Supervision Gait Distance (Feet): 80 Feet Assistive device: Rolling walker (2 wheels) Gait Pattern/deviations: Decreased stride length Gait velocity: decreased     General Gait Details: improved confidenc with RW   Stairs Stairs: Yes Stairs assistance: Supervision Stair Management: One rail Right Number of Stairs: 4     Wheelchair Mobility    Modified Rankin (Stroke Patients Only)       Balance Overall balance assessment: Needs assistance Sitting-balance support: No upper extremity supported Sitting balance-Leahy Scale: Good     Standing balance support: Bilateral upper extremity supported Standing balance-Leahy Scale: Fair   Single Leg Stance - Right Leg: 12 Single Leg Stance - Left Leg: 4 Tandem Stance - Right Leg: 17 Tandem Stance - Left Leg: 19       High Level Balance Comments: standing iwth WBOS with EO/EC 30 secs withotu hand support            Cognition Arousal/Alertness: Awake/alert Behavior During Therapy: WFL for tasks assessed/performed Overall Cognitive Status: Within Functional Limits for tasks assessed  Exercises      General Comments        Pertinent Vitals/Pain Pain Assessment Pain Assessment: No/denies pain    Home Living Family/patient expects to  be discharged to:: Private residence Living Arrangements: Alone Available Help at Discharge: Available 24 hours/day;Family;Friend(s) Type of Home: House Home Access: Stairs to enter Entrance Stairs-Rails: Right;Left (uses R) Technical brewer of Steps: 3-5   Home Layout: One level (2 steps to [employer]) Home Equipment: None Additional Comments: grab bar i n the tub    Prior Function            PT Goals (current goals can now be found in the care plan section) Acute Rehab PT Goals Patient Stated Goal: " I want to get better and go home." PT Goal Formulation: With patient Time For Goal Achievement: 08/15/22 Potential to Achieve Goals: Good Progress towards PT goals: Progressing toward goals    Frequency    7X/week      PT Plan Current plan remains appropriate    Co-evaluation              AM-PAC PT "6 Clicks" Mobility   Outcome Measure  Help needed turning from your back to your side while in a flat bed without using bedrails?: None Help needed moving from lying on your back to sitting on the side of a flat bed without using bedrails?: None Help needed moving to and from a bed to a chair (including a wheelchair)?: None Help needed standing up from a chair using your arms (e.g., wheelchair or bedside chair)?: None Help needed to walk in hospital room?: A Little Help needed climbing 3-5 steps with a railing? : A Little 6 Click Score: 22    End of Session Equipment Utilized During Treatment: Gait belt Activity Tolerance: Patient tolerated treatment well Patient left: in bed;with call bell/phone within reach;with family/visitor present Nurse Communication: Mobility status;Precautions PT Visit Diagnosis: Unsteadiness on feet (R26.81);Muscle weakness (generalized) (M62.81);Difficulty in walking, not elsewhere classified (R26.2)     Time: 4098-1191 PT Time Calculation (min) (ACUTE ONLY): 24 min  Charges:  $Gait Training: 8-22 mins $Therapeutic  Activity: 8-22 mins                    Joaquin Music PT DPT 1:46 PM,08/01/22

## 2022-08-01 NOTE — Discharge Summary (Signed)
Physician Discharge Summary  Patient ID: Tina Johnston MRN: [id number] DOB/AGE: 01-10-1941 81 y.o.  Admit date: 07/31/2022 Discharge date: 08/01/2022  Admission Diagnoses:  Discharge Diagnoses:  Principal Problem:   Cervical myelopathy [facility])   Discharged Condition: good  Hospital Course:  Tina Johnston is a 81 senting with cervical myelopathy.  She underwent C3-4 ACDF.  She is admitted for therapy evaluation and pain control.  Therapy saw her and cleared her for discharge home with home health.  She was discharged on postop day 1 with medications for pain and muscle relaxant  Consults: None  Significant Diagnostic Studies: none  Treatments: surgery: as above.  Please see separately dictated operative report for further details.  Discharge Exam: Blood pressure (!) 147/61, pulse 83, temperature 98.2 F (36.8 C), resp. rate 16, height '5\' 3"'$  (1.6 m), weight 64 kg, SpO2 99 %. AA Ox3 CNI   Strength:5/5 throughout  Incision covered dermabond and intact  Disposition: Discharge disposition: 06-Home-Health Care Svc       Discharge Instructions     Diet - low sodium heart healthy   Complete by: As directed    Incentive spirometry RT   Complete by: As directed    No wound care   Complete by: As directed       Allergies as of 08/01/2022       Reactions   Ultram [tramadol] Itching        Medication List     STOP taking these medications    meclizine 25 MG tablet Commonly known as: ANTIVERT   Pataday 0.2 % Soln Generic drug: Olopatadine HCl       TAKE these medications    acetaminophen 500 MG tablet Commonly known as: TYLENOL Take 500 mg by mouth 2 (two) times daily.   amLODipine 5 MG tablet Commonly known as: NORVASC Take 7.5 tablets by mouth daily.   Calcium Carb-Cholecalciferol 600-800 MG-UNIT Tabs Take 1 tablet by mouth 2 (two) times daily.   donepezil 5 MG tablet Commonly known as: ARICEPT Take 5 mg by mouth at bedtime.    fluticasone 50 MCG/ACT nasal spray Commonly known as: FLONASE Place 1 spray into both nostrils daily.   methocarbamol 500 MG tablet Commonly known as: ROBAXIN Take 1 tablet (500 mg total) by mouth every 8 (eight) hours as needed for muscle spasms.   multivitamin with minerals tablet Take 1 tablet by mouth daily.   oxyCODONE 5 MG immediate release tablet Commonly known as: Oxy IR/ROXICODONE Take 1 tablet (5 mg total) by mouth every 4 (four) hours as needed for up to 5 days for severe pain ((score 4 to 6)).   potassium chloride 10 MEQ tablet Commonly known as: KLOR-CON Take 1 tablet (10 mEq total) by mouth daily. What changed: when to take this   senna 8.6 MG Tabs tablet Commonly known as: SENOKOT Take 1 tablet (8.6 mg total) by mouth daily as needed for mild constipation.   simvastatin 20 MG tablet Commonly known as: ZOCOR Take 1 tablet by mouth at bedtime.   timolol 0.25 % ophthalmic solution Commonly known as: BETIMOL Place 1-2 drops into both eyes daily.   valsartan-hydrochlorothiazide 160-12.5 MG tablet Commonly known as: DIOVAN-HCT Take 1 tablet by mouth daily.   vitamin C 100 MG tablet Take 100 mg by mouth daily.               Durable Medical Equipment  (From admission, onward)           Start  Ordered   08/01/22 1031  For home use only DME Bedside commode  Once       Question:  Patient needs a bedside commode to treat with the following condition  Answer:  Cervical myopathy   08/01/22 1030   08/01/22 1030  For home use only DME Walker rolling  Once       Question Answer Comment  Walker: With Cora   Patient needs a walker to treat with the following condition Cervical myelopathy ([place])      08/01/22 1030            Follow-up Information     Loleta Dicker, PA Follow up in 2 week(s).   Specialty: Neurosurgery Contact information: [address] [place] [address] [phone]                  Signed: Loleta Dicker 08/01/2022, 1:45 PM

## 2022-08-01 NOTE — Progress Notes (Signed)
    Attending Progress Note  History: Tina Johnston is s/p C3-4 ACDF  POD1: NAEO  Physical Exam: Vitals:   07/31/22 2327 08/01/22 0610  BP: (!) 157/71 (!) 157/74  Pulse: 87 86  Resp: 20 20  Temp: 98.7 F (37.1 C) 98.7 F (37.1 C)  SpO2: 99% 98%    AA Ox3 CNI  Strength:5/5 throughout  Incision covered dermabond and intact  Data:  No results for input(s): "NA", "K", "CL", "CO2", "BUN", "CREATININE", "LABGLOM", "GLUCOSE", "CALCIUM" in the last 168 hours. No results for input(s): "AST", "ALT", "ALKPHOS" in the last 168 hours.  Invalid input(s): "TBILI"   No results for input(s): "WBC", "HGB", "HCT", "PLT" in the last 168 hours. No results for input(s): "APTT", "INR" in the last 168 hours.       Other tests/results: none   Assessment/Plan:  Tina Johnston is a 81 y.o s/p C3-4 ACDF for cervical myelopathy.   - mobilize - pain control - DVT prophylaxis - PTOT; set up with [facility] pre-op. Will defer final recommendations to therapy. Plan to d/c today if doing well and cleared by therapy.  Cooper Render PA-C Department of Neurosurgery

## 2022-08-01 NOTE — Evaluation (Signed)
Physical Therapy Evaluation Patient Details Name: Tina Johnston MRN: [id number] DOB: 1941/02/02 Today's Date: 08/01/2022  History of Present Illness  Tina Johnston is a 81 y/o female who presented on 07/31/22 for surgery, with the diagnosis of Cervical myelopathy   Cervical stenosis of spinal canal.  The various methods of treatment have been discussed with the patient and family. After consideration of risks, benefits and other options for treatment, the patient has consented to  Procedure(s):  C3-4 ANTERIOR CERVICAL DISCECTOMY AND FUSION (GLOBUS HEDRON) (N/A) as a surgical intervention.  The patient's history has been reviewed, patient examined, no change in status, stable for surgery. Pt is now 1) Anterior cervical diskectomy and fusion at C3-4  2) Anterior cervical instrumentation at C3-4 using Globus Xtend  3) Insertion of biomechanical device at C3-4 (Globus Hedron C).  Clinical Impression  Pt received in bed agreeable to to PT evaluation. Pt without pain and tingling sensation in LUE in digits. Pt able to perform bed mobility independently. Able to transfer OOB  to chair and toilet with Sup and RW. Pt attempted to ambulate with RW, IV pole and  without Ad and appears confident with RW. Pt balance is fair- without support evident by [facility] test including tandem standing, single leg standing < 10 secs and requires CGA to maintain balance and decreased confidence. Pt also has reduced vision due ot catract. Pt advised to keep the home well lite through out th eday and to remove any floor rugs to prevent falls. Pt will benefit from HHPT after acute care as pt's daughter plans to stay with pt upon discharge. Pt will attempt Steps later in the after noon.        Recommendations for follow up therapy are one component of a multi-disciplinary discharge planning process, led by the attending physician.  Recommendations may be updated based on patient status, additional functional criteria and  insurance authorization.  Follow Up Recommendations Home health PT      Assistance Recommended at Discharge Intermittent Supervision/Assistance  Patient can return home with the following  A little help with walking and/or transfers;A little help with bathing/dressing/bathroom;Assistance with cooking/housework;Assistance with feeding;Assist for transportation;Help with stairs or ramp for entrance    Equipment Recommendations Rolling walker (2 wheels);BSC/3in1  Recommendations for Other Services       Functional Status Assessment Patient has had a recent decline in their functional status and demonstrates the ability to make significant improvements in function in a reasonable and predictable amount of time.     Precautions / Restrictions Precautions Precautions: Fall;Cervical Precaution Comments: no bending, lifting, twisting Restrictions Weight Bearing Restrictions: No      Mobility  Bed Mobility Overal bed mobility: Independent                  Transfers Overall transfer level: Needs assistance Equipment used: Rolling walker (2 wheels) Transfers: Sit to/from Stand, Bed to chair/wheelchair/BSC Sit to Stand: Supervision   Step pivot transfers: Supervision            Ambulation/Gait Ambulation/Gait assistance: Min guard Gait Distance (Feet): 70 Feet Assistive device: Rolling walker (2 wheels), IV Pole, None Gait Pattern/deviations: Decreased stride length (guarded and slow) Gait velocity: decreased     General Gait Details: low confidence  Stairs            Wheelchair Mobility    Modified Rankin (Stroke Patients Only)       Balance Overall balance assessment: Needs assistance Sitting-balance support: No upper extremity supported  Sitting balance-Leahy Scale: Good     Standing balance support: No upper extremity supported Standing balance-Leahy Scale: Fair   Single Leg Stance - Right Leg: 12 Single Leg Stance - Left Leg: 4 Tandem  Stance - Right Leg: 17 Tandem Stance - Left Leg: 19       High Level Balance Comments: standing iwth WBOS with EO/EC 30 secs withotu hand support             Pertinent Vitals/Pain Pain Assessment Pain Assessment: No/denies pain    Home Living Family/patient expects to be discharged to:: Private residence Living Arrangements: Alone Available Help at Discharge: Available 24 hours/day;Family;Friend(s) Type of Home: House Home Access: Stairs to enter Entrance Stairs-Rails: Right;Left (uses R) Technical brewer of Steps: 3-5   Home Layout: One level (2 steps to [employer]) Home Equipment: None      Prior Function Prior Level of Function : Independent/Modified Independent (recently due to cataract but pt stopped driving and daughter drives the pt.)             Mobility Comments: Pyt independnet with ADLs including cooking, cleaning without AD and IADLs  and was driving until 6 months ago 2/2 vision issues. ADLs Comments: Independent     Hand Dominance   Dominant Hand: Right    Extremity/Trunk Assessment   Upper Extremity Assessment Upper Extremity Assessment: Defer to OT evaluation    Lower Extremity Assessment Lower Extremity Assessment: Generalized weakness    Cervical / Trunk Assessment Cervical / Trunk Assessment: Other exceptions (guarded)  Communication   Communication: No difficulties  Cognition Arousal/Alertness: Awake/alert Behavior During Therapy: WFL for tasks assessed/performed Overall Cognitive Status: Within Functional Limits for tasks assessed                                          General Comments      Exercises     Assessment/Plan    PT Assessment Patient needs continued PT services  PT Problem List Decreased strength;Decreased range of motion;Decreased activity tolerance;Decreased balance       PT Treatment Interventions Gait training;Stair training;Therapeutic activities;Therapeutic exercise;Balance  training;Neuromuscular re-education;Patient/family education    PT Goals (Current goals can be found in the Care Plan section)  Acute Rehab PT Goals Patient Stated Goal: " I want to get better and go home." PT Goal Formulation: With patient Time For Goal Achievement: 08/15/22 Potential to Achieve Goals: Good    Frequency 7X/week     Co-evaluation               AM-PAC PT "6 Clicks" Mobility  Outcome Measure Help needed turning from your back to your side while in a flat bed without using bedrails?: None Help needed moving from lying on your back to sitting on the side of a flat bed without using bedrails?: None Help needed moving to and from a bed to a chair (including a wheelchair)?: A Little Help needed standing up from a chair using your arms (e.g., wheelchair or bedside chair)?: None Help needed to walk in hospital room?: A Little Help needed climbing 3-5 steps with a railing? : A Little 6 Click Score: 21    End of Session Equipment Utilized During Treatment: Gait belt Activity Tolerance: Patient tolerated treatment well;Patient limited by fatigue Patient left: in chair;with call bell/phone within reach;with family/visitor present Nurse Communication: Mobility status;Precautions PT Visit Diagnosis: Unsteadiness on feet (R26.81);Muscle  weakness (generalized) (M62.81);Difficulty in walking, not elsewhere classified (R26.2)    Time: 1594-5859 PT Time Calculation (min) (ACUTE ONLY): 62 min   Charges:   PT Evaluation $PT Eval Low Complexity: 1 Low PT Treatments $Gait Training: 8-22 mins $Therapeutic Activity: 38-52 mins        Lakeia Bradshaw PT DPT 11:47 AM,08/01/22  08/01/2022, 11:44 AM

## 2022-08-01 NOTE — ED Triage Notes (Signed)
Pt presents via POV with complaints of neck swelling that started 1-2 hours ago with difficulty swallowing. Notable edema to the front of her neck with mild erythema. Of note, the patient had C3-C4 anterior cervical discectomy and fusion procedure performed yesterday. Denies CP or SOB.

## 2022-08-01 NOTE — Evaluation (Addendum)
Occupational Therapy Evaluation Patient Details Name: Tina Johnston MRN: [id number] DOB: [birth year] Today's Date: 08/01/2022   History of Present Illness Tina Johnston is a 81 y/o female who presented on 07/31/22 for surgery, with the diagnosis of Cervical myelopathy and Cervical stenosis of spinal canal.  Pt is now s/p C3-4 ACDF for cervical myelopathy.    Clinical Impression   Patient seen for OT evaluation this date. Patient presenting with generalized weakness, decreased activity tolerance, impaired balance, and impaired vision. At baseline, pt was independent in ADLs, IADLs (except for driving), and functional mobility without an AD. Pt lives alone, but family is available to provide assistance as needed upon discharge. Patient currently requires set up A for most ADLs and supervision for functional mobility and functional transfers using a RW. Patient will benefit from acute skilled OT services while in this hospital to increase overall independence in the areas of ADLs and functional mobility in order to safely discharge home. No OT follow up indicated at this time.      Recommendations for follow up therapy are one component of a multi-disciplinary discharge planning process, led by the attending physician.  Recommendations may be updated based on patient status, additional functional criteria and insurance authorization.   Follow Up Recommendations  No OT follow up    Assistance Recommended at Discharge Set up Supervision/Assistance  Patient can return home with the following Assistance with cooking/housework;Assist for transportation;A little help with walking and/or transfers;A little help with bathing/dressing/bathroom    Functional Status Assessment  Patient has had a recent decline in their functional status and demonstrates the ability to make significant improvements in function in a reasonable and predictable amount of time.  Equipment Recommendations  None  recommended by OT           Precautions / Restrictions Precautions Precautions: Fall;Cervical Precaution Comments: no bending, lifting, twisting Restrictions Weight Bearing Restrictions: No      Mobility Bed Mobility Overal bed mobility: Independent               Patient Response: Cooperative  Transfers Overall transfer level: Needs assistance Equipment used: Rolling walker (2 wheels) Transfers: Sit to/from Stand Sit to Stand: Supervision           General transfer comment: Pt completed STS (2x) from EOB with supervision + RW      Balance Overall balance assessment: Needs assistance Sitting-balance support: No upper extremity supported Sitting balance-Leahy Scale: Good     Standing balance support: No upper extremity supported Standing balance-Leahy Scale: Good Standing balance comment: Pt able to maintain static standing balance at sink with no UE support                           ADL either performed or assessed with clinical judgement   ADL Overall ADL's : Needs assistance/impaired     Grooming: Wash/dry hands;Wash/dry face;Standing;Supervision/safety;Set up Grooming Details (indicate cue type and reason): Pt completed grooming tasks while standing at bathroom sink                 Toilet Transfer: Supervision/safety;Rolling walker (2 wheels);Regular Toilet   Toileting- Clothing Manipulation and Hygiene: Supervision/safety;Sit to/from stand       Functional mobility during ADLs: Supervision/safety;Rolling walker (2 wheels)       Vision Baseline Vision/History: 1 Wears glasses;4 Cataracts Patient Visual Report: No change from baseline  Pertinent Vitals/Pain Pain Assessment Pain Assessment: No/denies pain     Hand Dominance Right   Extremity/Trunk Assessment Upper Extremity Assessment Upper Extremity Assessment: Defer to OT evaluation   Lower Extremity Assessment Lower Extremity Assessment:  Generalized weakness   Cervical / Trunk Assessment Cervical / Trunk Assessment: Other exceptions (guarded)   Communication Communication Communication: No difficulties   Cognition Arousal/Alertness: Awake/alert Behavior During Therapy: WFL for tasks assessed/performed Overall Cognitive Status: Within Functional Limits for tasks assessed                                                        Home Living Family/patient expects to be discharged to:: Private residence Living Arrangements: Alone Available Help at Discharge: Available 24 hours/day;Family;Friend(s) Type of Home: House Home Access: Stairs to enter [employer] of Steps: 3-5 Entrance Stairs-Rails: Right;Left (uses R) Home Layout: One level (2 steps to [employer])     Bathroom Shower/Tub: Teacher, early years/pre: Standard     Home Equipment: None   Additional Comments: grab bar i n the tub      Prior Functioning/Environment Prior Level of Function : Independent/Modified Independent (recently due to cataract but pt stopped driving and daughter drives the pt.)             Mobility Comments: Pyt independnet with ADLs including cooking, cleaning without AD and IADLs  and was driving until 6 months ago 2/2 vision issues. ADLs Comments: Independent        OT Problem List: Decreased activity tolerance;Impaired sensation;Impaired balance (sitting and/or standing);Decreased strength;Impaired vision/perception      OT Treatment/Interventions: Self-care/ADL training;Visual/perceptual remediation/compensation;Therapeutic exercise;Patient/family education;Balance training;Energy conservation;DME and/or AE instruction;Therapeutic activities    OT Goals(Current goals can be found in the care plan section) Acute Rehab OT Goals Patient Stated Goal: get stronger, return home OT Goal Formulation: With patient/family Time For Goal Achievement: 08/15/22 Potential to Achieve  Goals: Good ADL Goals Pt Will Perform Grooming: Independently;standing Pt Will Perform Upper Body Dressing: Independently Pt Will Perform Lower Body Dressing: Independently;sitting/lateral leans Pt Will Transfer to Toilet: Independently;regular height toilet Pt Will Perform Toileting - Clothing Manipulation and hygiene: Independently;sit to/from stand  OT Frequency: Min 2X/week                  AM-PAC OT "6 Clicks" Daily Activity     Outcome Measure Help from another person eating meals?: None Help from another person taking care of personal grooming?: A Little Help from another person toileting, which includes using toliet, bedpan, or urinal?: A Little Help from another person bathing (including washing, rinsing, drying)?: A Little Help from another person to put on and taking off regular upper body clothing?: None Help from another person to put on and taking off regular lower body clothing?: A Little 6 Click Score: 20   End of Session Equipment Utilized During Treatment: Gait belt;Rolling walker (2 wheels) Nurse Communication: Mobility status  Activity Tolerance: Patient tolerated treatment well Patient left: in bed;with call bell/phone within reach;with family/visitor present  OT Visit Diagnosis: Unsteadiness on feet (R26.81);Muscle weakness (generalized) (M62.81);Low vision, both eyes (H54.2)                Time: 9470-9628 OT Time Calculation (min): 28 min Charges:  OT General Charges $OT Visit: 1 Visit OT Evaluation $OT Eval Low  Complexity: 1 Low  [facility] MS, OTR/L ascom [phone]  08/01/22, 11:53 AM

## 2022-08-01 NOTE — Plan of Care (Signed)

## 2022-08-01 NOTE — TOC Transition Note (Addendum)
Transition of Care [facility]) - CM/SW Discharge Note   Patient Details  Name: Tina Johnston MRN: [id number] Date of Birth: 1940/12/27  Transition of Care [facility]) CM/SW Contact:  Tina Slimmer, RN Phone Number: 08/01/2022, 12:57 PM   Clinical Narrative:    Spoke with patient and daughter at bedside regarding discharge plan. Patient daughter will transport her home. Patient lives alone but has sisters that will assist with care if needed. Patient [name] care arranged with [facility]. Patient uses [facility]. Denies any difficulty getting medications. RW and 3and1 requested via [employer].         Patient Goals and CMS Choice        Discharge Placement                       Discharge Plan and Services                                     Social Determinants of Health (SDOH) Interventions     Readmission Risk Interventions     No data to display

## 2022-08-01 NOTE — Progress Notes (Signed)
Patient is not able to walk the distance required to go the bathroom, or he/she is unable to safely negotiate stairs required to access the bathroom.  A 3in1 BSC will alleviate this problem  

## 2022-08-01 NOTE — ED Provider Notes (Signed)
[facility] Provider Note    Event Date/Time   First MD Initiated Contact with Patient 08/01/22 2108     (approximate)   History   Neck Swelling and Post-op Problem   HPI  Tina Johnston is a 81 y.o. female status post ACDF presents to the ER for evaluation of trouble swallowing neck swelling bruising.  Patient states that she was feeling okay after discharge and then today started having a coughing fits felt like her neck was starting to swell and cause more pain.  No fevers.  She is having pain with swallowing but is able to eat and drink.  Denies any shortness of breath no pain with speaking.     Physical Exam   Triage Vital Signs: ED Triage Vitals  Enc Vitals Group     BP 08/01/22 2109 (!) 180/77     Pulse Rate 08/01/22 2109 84     Resp 08/01/22 2109 18     Temp 08/01/22 2109 98.8 F (37.1 C)     Temp Source 08/01/22 2109 Oral     SpO2 08/01/22 2109 99 %     Weight 08/01/22 2107 141 lb (64 kg)     Height 08/01/22 2107 '5\' 3"'$  (1.6 m)     Head Circumference --      Peak Flow --      Pain Score 08/01/22 2109 7     Pain Loc --      Pain Edu? --      Excl. in [place]? --     Most recent vital signs: Vitals:   08/01/22 2109 08/01/22 2200  BP: (!) 180/77 (!) 151/76  Pulse: 84 92  Resp: 18 (!) 21  Temp: 98.8 F (37.1 C)   SpO2: 99% 96%     Constitutional: Alert  Eyes: Conjunctivae are normal.  Head: Atraumatic. Nose: No congestion/rhinnorhea. Mouth/Throat: Mucous membranes are moist.   Neck: Painless ROM.  Cardiovascular:   Good peripheral circulation. Respiratory: Normal respiratory effort.  No retractions.  Gastrointestinal: Soft and nontender.  Musculoskeletal:  no deformity Neurologic:  MAE spontaneously. No gross focal neurologic deficits are appreciated.  Skin:  Skin is warm, dry and intact. No rash noted. Psychiatric: Mood and affect are normal. Speech and behavior are normal.    ED Results / Procedures / Treatments    Labs (all labs ordered are listed, but only abnormal results are displayed) Labs Reviewed  CBC WITH DIFFERENTIAL/PLATELET - Abnormal; Notable for the following components:      Result Value   WBC 15.2 (*)    Platelets 406 (*)    Neutro Abs 12.0 (*)    Monocytes Absolute 1.1 (*)    All other components within normal limits  COMPREHENSIVE METABOLIC PANEL - Abnormal; Notable for the following components:   Potassium 3.4 (*)    Glucose, Bld 147 (*)    All other components within normal limits     EKG     RADIOLOGY Please see ED Course for my review and interpretation.  I personally reviewed all radiographic images ordered to evaluate for the above acute complaints and reviewed radiology reports and findings.  These findings were personally discussed with the patient.  Please see medical record for radiology report.    PROCEDURES:  Critical Care performed: No  Procedures   MEDICATIONS ORDERED IN ED: Medications  iohexol (OMNIPAQUE) 300 MG/ML solution 75 mL (75 mLs Intravenous Contrast Given 08/01/22 2240)  dexamethasone (DECADRON) injection 10 mg (10 mg Intravenous  Given 08/01/22 2337)     IMPRESSION / MDM / ASSESSMENT AND PLAN / ED COURSE  I reviewed the triage vital signs and the nursing notes.                              Differential diagnosis includes, but is not limited to, hematoma, abscess, infection, edema, contusion  Patient presents to the ER for evaluation of symptoms as described above.  This presenting complaint could reflect a potentially life-threatening illness therefore the patient will be placed on continuous pulse oximetry and telemetry for monitoring.  Laboratory evaluation will be sent to evaluate for the above complaints.  She is protecting her airway currently no stridor on exam.  Signs are stable.  Will check blood work.  Will image.   Clinical Course as of 08/01/22 2349  Thu Aug 01, 2022  2306 CT imaging on my review and interpretation  with patent airway does have some mild deviation to the right.  Will await formal radiology report. [PR]  2345 Discussed case in consultation with Dr. Cari Caraway who is coming to evaluate patient.  I reassessed her multiple times.  She does not have any stridor on exam.  She does have some discomfort with swallowing but has appropriate phonation.  Does not appear to have an expanding hematoma there is no evidence of active extract.  I have ordered IV Decadron.  I have discussed case with ICU.  At this point as she appears stable I think it is appropriate to observe and have neurosurgery evaluate bedside to determine need for possible operative management versus further observation. [PR]    Clinical Course User Index [PR] Merlyn Lot, MD     FINAL CLINICAL IMPRESSION(S) / ED DIAGNOSES   Final diagnoses:  Hematoma  Pain with swallowing     Rx / DC Orders   ED Discharge Orders     None        Note:  This document was prepared using Dragon voice recognition software and may include unintentional dictation errors.    Merlyn Lot, MD 08/01/22 [phone]

## 2022-08-01 NOTE — Discharge Instructions (Signed)
Your surgeon has performed an operation on your cervical spine (neck) to relieve pressure on the spinal cord and/or nerves. This involved making an incision in the front of your neck and removing one or more of the discs that support your spine. Next, a small piece of bone, a titanium plate, and screws were used to fuse two or more of the vertebrae (bones) together.  The following are instructions to help in your recovery once you have been discharged from the hospital. Even if you feel well, it is important that you follow these activity guidelines. If you do not let your neck heal properly from the surgery, you can increase the chance of return of your symptoms and other complications.  * Do not take anti-inflammatory medications for 3 months after surgery (naproxen [Aleve], ibuprofen [Advil, Motrin]. These medications can prevent your bones from healing properly.  Activity    No bending, lifting, or twisting ("BLT"). Avoid lifting objects heavier than 10 pounds (gallon milk jug).  Where possible, avoid household activities that involve lifting, bending, reaching, pushing, or pulling such as laundry, vacuuming, grocery shopping, and childcare. Try to arrange for help from friends and family for these activities while your back heals.  Increase physical activity slowly as tolerated.  Taking short walks is encouraged, but avoid strenuous exercise. Do not jog, run, bicycle, lift weights, or participate in any other exercises unless specifically allowed by your doctor.  Talk to your doctor before resuming sexual activity.  You should not drive until cleared by your doctor.  Until released by your doctor, you should not return to work or school.  You should rest at home and let your body heal.   You may shower three days after your surgery.  After showering, lightly dab your incision dry. Do not take a tub bath or go swimming until approved by your doctor at your follow-up appointment.  If your  doctor ordered a cervical collar (neck brace) for you, you should wear it whenever you are out of bed. You may remove it when lying down or sleeping, but you should wear it at all other times. Not all neck surgeries require a cervical collar.  If you smoke, we strongly recommend that you quit.  Smoking has been proven to interfere with normal bone healing and will dramatically reduce the success rate of your surgery. Please contact [facility] (800-QUIT-NOW) and use the resources at www.[facility].com for assistance in stopping smoking.  Surgical Incision   If you have a dressing on your incision, you may remove it two days after your surgery. Keep your incision area clean and dry.  Your incision was closed with Dermabond glue. The glue should begin to peel away within about a week. Diet           You may return to your usual diet. However, you may experience discomfort when swallowing in the first month after your surgery. This is normal. You may find that softer foods are more comfortable for you to swallow. Be sure to stay hydrated.  When to Contact us  You may experience pain in your neck and/or pain between your shoulder blades. This is normal and should improve in the next few weeks with the help of pain medication, muscle relaxers, and rest. Some patients report that a warm compress on the back of the neck or between the shoulder blades helps.  However, should you experience any of the following, contact us immediately: New numbness or weakness Pain that is progressively getting  worse, and is not relieved by your pain medication, muscle relaxers, rest, and warm compresses Bleeding, redness, swelling, pain, or drainage from surgical incision Chills or flu-like symptoms Fever greater than 101.0 F (38.3 C) Inability to eat, drink fluids, or take medications Problems with bowel or bladder functions Difficulty breathing or shortness of breath Warmth, tenderness, or swelling in your  calf Contact Information During office hours (Monday-Friday 9 am to 5 pm), please call your physician at [phone] and ask for Berdine Addison After hours and weekends, please call [phone] and speak with the neurosurgeon on call For a life-threatening emergency, call 911

## 2022-08-02 ENCOUNTER — Inpatient Hospital Stay: Payer: Medicare HMO | Admitting: Anesthesiology

## 2022-08-02 ENCOUNTER — Encounter: Payer: Self-pay | Admitting: Neurosurgery

## 2022-08-02 ENCOUNTER — Encounter
Admission: EM | Disposition: A | Discharge status: Home / Self Care | Payer: Self-pay | Source: Home / Self Care | Attending: Student in an Organized Health Care Education/Training Program

## 2022-08-02 ENCOUNTER — Other Ambulatory Visit: Payer: Self-pay

## 2022-08-02 ENCOUNTER — Inpatient Hospital Stay: Payer: Medicare HMO

## 2022-08-02 DIAGNOSIS — R1310 Dysphagia, unspecified: Secondary | ICD-10-CM | POA: Diagnosis present

## 2022-08-02 DIAGNOSIS — T148XXA Other injury of unspecified body region, initial encounter: Secondary | ICD-10-CM | POA: Diagnosis present

## 2022-08-02 DIAGNOSIS — Z801 Family history of malignant neoplasm of trachea, bronchus and lung: Secondary | ICD-10-CM | POA: Diagnosis not present

## 2022-08-02 DIAGNOSIS — Y838 Other surgical procedures as the cause of abnormal reaction of the patient, or of later complication, without mention of misadventure at the time of the procedure: Secondary | ICD-10-CM | POA: Diagnosis present

## 2022-08-02 DIAGNOSIS — Z8042 Family history of malignant neoplasm of prostate: Secondary | ICD-10-CM | POA: Diagnosis not present

## 2022-08-02 DIAGNOSIS — I700 Atherosclerosis of aorta: Secondary | ICD-10-CM | POA: Diagnosis not present

## 2022-08-02 DIAGNOSIS — S1093XA Contusion of unspecified part of neck, initial encounter: Secondary | ICD-10-CM | POA: Diagnosis not present

## 2022-08-02 DIAGNOSIS — I10 Essential (primary) hypertension: Secondary | ICD-10-CM | POA: Diagnosis present

## 2022-08-02 DIAGNOSIS — R222 Localized swelling, mass and lump, trunk: Secondary | ICD-10-CM | POA: Diagnosis not present

## 2022-08-02 DIAGNOSIS — M4712 Other spondylosis with myelopathy, cervical region: Secondary | ICD-10-CM | POA: Diagnosis present

## 2022-08-02 DIAGNOSIS — J9601 Acute respiratory failure with hypoxia: Secondary | ICD-10-CM

## 2022-08-02 DIAGNOSIS — Z803 Family history of malignant neoplasm of breast: Secondary | ICD-10-CM | POA: Diagnosis not present

## 2022-08-02 DIAGNOSIS — Z9911 Dependence on respirator [ventilator] status: Secondary | ICD-10-CM | POA: Diagnosis not present

## 2022-08-02 DIAGNOSIS — Z853 Personal history of malignant neoplasm of breast: Secondary | ICD-10-CM | POA: Diagnosis not present

## 2022-08-02 DIAGNOSIS — Z885 Allergy status to narcotic agent status: Secondary | ICD-10-CM | POA: Diagnosis not present

## 2022-08-02 DIAGNOSIS — Z90710 Acquired absence of both cervix and uterus: Secondary | ICD-10-CM | POA: Diagnosis not present

## 2022-08-02 DIAGNOSIS — M2578 Osteophyte, vertebrae: Secondary | ICD-10-CM | POA: Diagnosis present

## 2022-08-02 DIAGNOSIS — Z923 Personal history of irradiation: Secondary | ICD-10-CM | POA: Diagnosis not present

## 2022-08-02 DIAGNOSIS — M5001 Cervical disc disorder with myelopathy,  high cervical region: Secondary | ICD-10-CM | POA: Diagnosis present

## 2022-08-02 DIAGNOSIS — L7632 Postprocedural hematoma of skin and subcutaneous tissue following other procedure: Secondary | ICD-10-CM | POA: Diagnosis present

## 2022-08-02 DIAGNOSIS — R221 Localized swelling, mass and lump, neck: Secondary | ICD-10-CM | POA: Diagnosis not present

## 2022-08-02 DIAGNOSIS — Z8249 Family history of ischemic heart disease and other diseases of the circulatory system: Secondary | ICD-10-CM | POA: Diagnosis not present

## 2022-08-02 DIAGNOSIS — M96841 Postprocedural hematoma of a musculoskeletal structure following other procedure: Secondary | ICD-10-CM | POA: Diagnosis not present

## 2022-08-02 DIAGNOSIS — M4802 Spinal stenosis, cervical region: Secondary | ICD-10-CM | POA: Diagnosis present

## 2022-08-02 DIAGNOSIS — E785 Hyperlipidemia, unspecified: Secondary | ICD-10-CM | POA: Diagnosis present

## 2022-08-02 DIAGNOSIS — Z79899 Other long term (current) drug therapy: Secondary | ICD-10-CM | POA: Diagnosis not present

## 2022-08-02 DIAGNOSIS — G3184 Mild cognitive impairment, so stated: Secondary | ICD-10-CM | POA: Diagnosis present

## 2022-08-02 DIAGNOSIS — J398 Other specified diseases of upper respiratory tract: Secondary | ICD-10-CM | POA: Diagnosis present

## 2022-08-02 DIAGNOSIS — Z8041 Family history of malignant neoplasm of ovary: Secondary | ICD-10-CM | POA: Diagnosis not present

## 2022-08-02 HISTORY — PX: ANTERIOR CERVICAL DECOMP/DISCECTOMY FUSION: SHX1161

## 2022-08-02 LAB — BASIC METABOLIC PANEL
Anion gap: 6 (ref 5–15)
BUN: 15 mg/dL (ref 8–23)
CO2: 25 mmol/L (ref 22–32)
Calcium: 9.1 mg/dL (ref 8.9–10.3)
Chloride: 107 mmol/L (ref 98–111)
Creatinine, Ser: 0.8 mg/dL (ref 0.44–1.00)
GFR, Estimated: >60 mL/min (ref >60)
Glucose, Bld: 181 mg/dL — ABNORMAL HIGH (ref 70–99)
Potassium: 3.6 mmol/L (ref 3.5–5.1)
Sodium: 138 mmol/L (ref 135–145)

## 2022-08-02 LAB — CBC
HCT: 36.2 % (ref 36.0–46.0)
Hemoglobin: 11.7 g/dL — ABNORMAL LOW (ref 12.0–15.0)
MCH: 27.7 pg (ref 26.0–34.0)
MCHC: 32.3 g/dL (ref 30.0–36.0)
MCV: 85.6 fL (ref 80.0–100.0)
Platelets: 387 10*3/uL (ref 150–400)
RBC: 4.23 MIL/uL (ref 3.87–5.11)
RDW: 15.3 % (ref 11.5–15.5)
WBC: 11.6 10*3/uL — ABNORMAL HIGH (ref 4.0–10.5)
nRBC: 0 % (ref 0.0–0.2)

## 2022-08-02 LAB — PROTIME-INR
INR: 1 (ref 0.8–1.2)
Prothrombin Time: 13.5 seconds (ref 11.4–15.2)

## 2022-08-02 LAB — MAGNESIUM: Magnesium: 2.1 mg/dL (ref 1.7–2.4)

## 2022-08-02 LAB — GLUCOSE, CAPILLARY: Glucose-Capillary: 166 mg/dL — ABNORMAL HIGH (ref 70–99)

## 2022-08-02 SURGERY — ANTERIOR CERVICAL DECOMPRESSION/DISCECTOMY FUSION 1 LEVEL
Anesthesia: General

## 2022-08-02 MED ORDER — HYDRALAZINE HCL 20 MG/ML IJ SOLN: 10.0000 mg | Freq: Four times a day (QID) | INTRAMUSCULAR | Status: DC | PRN

## 2022-08-02 MED ORDER — FENTANYL BOLUS VIA INFUSION
50.0000 ug | INTRAVENOUS | Status: DC | PRN
  Administered 2022-08-02: 50 ug via INTRAVENOUS

## 2022-08-02 MED ORDER — FENTANYL 2500MCG IN NS 250ML (10MCG/ML) PREMIX INFUSION
25.0000 ug/h | INTRAVENOUS | Status: DC
  Administered 2022-08-02: 25 ug/h via INTRAVENOUS
  Filled 2022-08-02: qty 250

## 2022-08-02 MED ORDER — ONDANSETRON HCL 4 MG/2ML IJ SOLN
INTRAMUSCULAR | Status: AC
  Filled 2022-08-02: qty 2

## 2022-08-02 MED ORDER — CEFAZOLIN SODIUM 1 G IJ SOLR
INTRAMUSCULAR | Status: AC
  Filled 2022-08-02: qty 20

## 2022-08-02 MED ORDER — PROPOFOL 500 MG/50ML IV EMUL
INTRAVENOUS | Status: DC | PRN
  Administered 2022-08-02: 120 ug/kg/min via INTRAVENOUS

## 2022-08-02 MED ORDER — CHLORHEXIDINE GLUCONATE 0.12 % MT SOLN
15.0000 mL | Freq: Once | OROMUCOSAL | Status: AC
  Administered 2022-08-02: 15 mL via OROMUCOSAL
  Filled 2022-08-02: qty 15

## 2022-08-02 MED ORDER — SURGIPHOR WOUND IRRIGATION SYSTEM - OPTIME
TOPICAL | Status: DC | PRN
  Administered 2022-08-02: 450 mL

## 2022-08-02 MED ORDER — DOCUSATE SODIUM 100 MG PO CAPS: 100.0000 mg | ORAL_CAPSULE | Freq: Two times a day (BID) | ORAL | Status: DC | PRN

## 2022-08-02 MED ORDER — DEXMEDETOMIDINE (PRECEDEX) IN NS 20 MCG/5ML (4 MCG/ML) IV SYRINGE
PREFILLED_SYRINGE | INTRAVENOUS | Status: DC | PRN
  Administered 2022-08-02 (×2): 8 ug via INTRAVENOUS

## 2022-08-02 MED ORDER — ROCURONIUM BROMIDE 100 MG/10ML IV SOLN
INTRAVENOUS | Status: DC | PRN
  Administered 2022-08-02: 50 mg via INTRAVENOUS
  Administered 2022-08-02: 30 mg via INTRAVENOUS

## 2022-08-02 MED ORDER — MIDAZOLAM HCL 2 MG/2ML IJ SOLN
INTRAMUSCULAR | Status: DC | PRN
  Administered 2022-08-02 (×2): 2 mg via INTRAVENOUS

## 2022-08-02 MED ORDER — ROCURONIUM BROMIDE 10 MG/ML (PF) SYRINGE
PREFILLED_SYRINGE | INTRAVENOUS | Status: AC
  Filled 2022-08-02: qty 10

## 2022-08-02 MED ORDER — ORAL CARE MOUTH RINSE: 15.0000 mL | OROMUCOSAL | Status: DC | PRN

## 2022-08-02 MED ORDER — ENSURE ENLIVE PO LIQD
237.0000 mL | Freq: Three times a day (TID) | ORAL | Status: DC
  Administered 2022-08-02 – 2022-08-04 (×7): 237 mL via ORAL

## 2022-08-02 MED ORDER — DEXAMETHASONE SODIUM PHOSPHATE 4 MG/ML IJ SOLN
4.0000 mg | Freq: Four times a day (QID) | INTRAMUSCULAR | Status: AC
  Administered 2022-08-02 – 2022-08-03 (×6): 4 mg via INTRAVENOUS
  Filled 2022-08-02 (×6): qty 1

## 2022-08-02 MED ORDER — DOCUSATE SODIUM 50 MG/5ML PO LIQD: 100.0000 mg | Freq: Two times a day (BID) | ORAL | Status: DC

## 2022-08-02 MED ORDER — MIDAZOLAM HCL 2 MG/2ML IJ SOLN
INTRAMUSCULAR | Status: AC
  Filled 2022-08-02: qty 2

## 2022-08-02 MED ORDER — ADULT MULTIVITAMIN W/MINERALS CH
1.0000 | ORAL_TABLET | Freq: Every day | ORAL | Status: DC
  Administered 2022-08-03 – 2022-08-04 (×2): 1 via ORAL
  Filled 2022-08-02 (×2): qty 1

## 2022-08-02 MED ORDER — SURGIFLO WITH THROMBIN (HEMOSTATIC MATRIX KIT) OPTIME
TOPICAL | Status: DC | PRN
  Administered 2022-08-02: 1 via TOPICAL

## 2022-08-02 MED ORDER — DEXAMETHASONE SODIUM PHOSPHATE 4 MG/ML IJ SOLN
4.0000 mg | Freq: Four times a day (QID) | INTRAMUSCULAR | Status: DC
  Administered 2022-08-02: 4 mg via INTRAVENOUS
  Filled 2022-08-02: qty 1

## 2022-08-02 MED ORDER — CEFAZOLIN SODIUM-DEXTROSE 2-3 GM-%(50ML) IV SOLR
INTRAVENOUS | Status: DC | PRN
  Administered 2022-08-02: 2 g via INTRAVENOUS

## 2022-08-02 MED ORDER — CHLORHEXIDINE GLUCONATE CLOTH 2 % EX PADS
6.0000 | MEDICATED_PAD | Freq: Every day | CUTANEOUS | Status: DC
Start: 2022-08-02 — End: 2022-08-04
  Administered 2022-08-03 – 2022-08-04 (×2): 6 via TOPICAL

## 2022-08-02 MED ORDER — ONDANSETRON HCL 4 MG/2ML IJ SOLN
INTRAMUSCULAR | Status: DC | PRN
  Administered 2022-08-02: 4 mg via INTRAVENOUS

## 2022-08-02 MED ORDER — PHENYLEPHRINE HCL (PRESSORS) 10 MG/ML IV SOLN
INTRAVENOUS | Status: DC | PRN
  Administered 2022-08-02 (×3): 80 ug via INTRAVENOUS

## 2022-08-02 MED ORDER — SODIUM CHLORIDE 0.9 % IV SOLN: INTRAVENOUS | Status: DC | PRN

## 2022-08-02 MED ORDER — POLYETHYLENE GLYCOL 3350 17 G PO PACK: 17.0000 g | PACK | Freq: Every day | ORAL | Status: DC

## 2022-08-02 MED ORDER — FENTANYL CITRATE (PF) 100 MCG/2ML IJ SOLN
INTRAMUSCULAR | Status: DC | PRN
Start: 2022-08-02 — End: 2022-08-02
  Administered 2022-08-02 (×2): 50 ug via INTRAVENOUS

## 2022-08-02 MED ORDER — PHENYLEPHRINE 80 MCG/ML (10ML) SYRINGE FOR IV PUSH (FOR BLOOD PRESSURE SUPPORT)
PREFILLED_SYRINGE | INTRAVENOUS | Status: AC
  Filled 2022-08-02: qty 10

## 2022-08-02 MED ORDER — POLYETHYLENE GLYCOL 3350 17 G PO PACK: 17.0000 g | PACK | Freq: Every day | ORAL | Status: DC | PRN

## 2022-08-02 MED ORDER — PROPOFOL 10 MG/ML IV BOLUS
INTRAVENOUS | Status: DC | PRN
  Administered 2022-08-02: 120 mg via INTRAVENOUS

## 2022-08-02 MED ORDER — 0.9 % SODIUM CHLORIDE (POUR BTL) OPTIME
TOPICAL | Status: DC | PRN
  Administered 2022-08-02: 500 mL

## 2022-08-02 MED ORDER — ORAL CARE MOUTH RINSE
15.0000 mL | OROMUCOSAL | Status: DC
  Administered 2022-08-02 (×3): 15 mL via OROMUCOSAL

## 2022-08-02 MED ORDER — SUCCINYLCHOLINE CHLORIDE 200 MG/10ML IV SOSY
PREFILLED_SYRINGE | INTRAVENOUS | Status: DC | PRN
  Administered 2022-08-02: 60 mg via INTRAVENOUS

## 2022-08-02 MED ORDER — FENTANYL CITRATE (PF) 100 MCG/2ML IJ SOLN
INTRAMUSCULAR | Status: AC
  Filled 2022-08-02: qty 2

## 2022-08-02 SURGICAL SUPPLY — 53 items
BASIN KIT SINGLE STR (MISCELLANEOUS) ×2 IMPLANT
BASKET BONE COLLECTION (BASKET) IMPLANT
BULB RESERV EVAC DRAIN JP 100C (MISCELLANEOUS) ×1 IMPLANT
BUR NEURO DRILL SOFT 3.0X3.8M (BURR) ×1 IMPLANT
CHLORAPREP W/TINT 26 (MISCELLANEOUS) ×2 IMPLANT
DERMABOND ADVANCED (GAUZE/BANDAGES/DRESSINGS) ×1
DERMABOND ADVANCED .7 DNX12 (GAUZE/BANDAGES/DRESSINGS) ×1 IMPLANT
DRAIN CHANNEL JP 10F RND 20C F (MISCELLANEOUS) ×1 IMPLANT
DRAPE C ARM PK CFD 31 SPINE (DRAPES) ×1 IMPLANT
DRAPE LAPAROTOMY 77X122 PED (DRAPES) ×2 IMPLANT
DRAPE MICROSCOPE SPINE 48X150 (DRAPES) ×1 IMPLANT
DRAPE SURG 17X11 SM STRL (DRAPES) ×2 IMPLANT
ELECT REM PT RETURN 9FT ADLT (ELECTROSURGICAL) ×2
ELECTRODE REM PT RTRN 9FT ADLT (ELECTROSURGICAL) ×1 IMPLANT
FEE INTRAOP CADWELL SUPPLY NCS (MISCELLANEOUS) IMPLANT
FEE INTRAOP MONITOR IMPULS NCS (MISCELLANEOUS) IMPLANT
GLOVE BIOGEL PI IND STRL 6.5 (GLOVE) ×1 IMPLANT
GLOVE BIOGEL PI IND STRL 8.5 (GLOVE) ×1 IMPLANT
GLOVE BIOGEL PI INDICATOR 6.5 (GLOVE)
GLOVE BIOGEL PI INDICATOR 8.5 (GLOVE) ×1
GLOVE SURG SYN 6.5 ES PF (GLOVE) IMPLANT
GLOVE SURG SYN 6.5 PF PI (GLOVE) ×1 IMPLANT
GLOVE SURG SYN 8.5  E (GLOVE) ×3
GLOVE SURG SYN 8.5 E (GLOVE) ×3 IMPLANT
GLOVE SURG SYN 8.5 PF PI (GLOVE) ×3 IMPLANT
GOWN SRG LRG LVL 4 IMPRV REINF (GOWNS) ×1 IMPLANT
GOWN SRG XL LVL 3 NONREINFORCE (GOWNS) ×1 IMPLANT
GOWN STRL NON-REIN TWL XL LVL3 (GOWNS) ×1
GOWN STRL REIN LRG LVL4 (GOWNS) ×1
INTRAOP CADWELL SUPPLY FEE NCS (MISCELLANEOUS)
INTRAOP DISP SUPPLY FEE NCS (MISCELLANEOUS)
INTRAOP MONITOR FEE IMPULS NCS (MISCELLANEOUS)
INTRAOP MONITOR FEE IMPULSE (MISCELLANEOUS)
KIT TURNOVER KIT A (KITS) ×2 IMPLANT
MANIFOLD NEPTUNE II (INSTRUMENTS) ×2 IMPLANT
NDL SAFETY ECLIPSE 18X1.5 (NEEDLE) ×1 IMPLANT
NEEDLE HYPO 18GX1.5 SHARP (NEEDLE) ×1
NS IRRIG 1000ML POUR BTL (IV SOLUTION) ×2 IMPLANT
PACK LAMINECTOMY NEURO (CUSTOM PROCEDURE TRAY) ×2 IMPLANT
PAD ARMBOARD 7.5X6 YLW CONV (MISCELLANEOUS) ×3 IMPLANT
PIN CASPAR 14 (PIN) ×1 IMPLANT
PIN CASPAR 14MM (PIN) IMPLANT
SOLUTION IRRIG SURGIPHOR (IV SOLUTION) ×1 IMPLANT
SPONGE KITTNER 5P (MISCELLANEOUS) ×2 IMPLANT
STAPLER SKIN PROX 35W (STAPLE) IMPLANT
SURGIFLO W/THROMBIN 8M KIT (HEMOSTASIS) ×2 IMPLANT
SUT V-LOC 90 ABS DVC 3-0 CL (SUTURE) ×2 IMPLANT
SUT VIC AB 3-0 SH 8-18 (SUTURE) ×2 IMPLANT
SYR 20ML LL LF (SYRINGE) ×2 IMPLANT
TAPE CLOTH 3X10 WHT NS LF (GAUZE/BANDAGES/DRESSINGS) ×6 IMPLANT
TRAP FLUID SMOKE EVACUATOR (MISCELLANEOUS) ×2 IMPLANT
TRAY FOLEY MTR SLVR 16FR STAT (SET/KITS/TRAYS/PACK) ×1 IMPLANT
WATER STERILE IRR 1000ML POUR (IV SOLUTION) ×4 IMPLANT

## 2022-08-02 NOTE — H&P (View-Only) (Signed)
Referring Physician:  No referring provider defined for this encounter.  Primary Physician:  Marinda Elk, MD  History of Present Illness: 08/02/2022 Ms. Delano Scardino is here today with a chief complaint of new neck swelling this evening after discharge from the hospital today.  She underwent an anterior cervical discectomy and fusion yesterday, and was doing well and stable for discharge today.  She had some coughing at home, and subsequently noticed new development of swelling in her neck as well as worsening swallowing discomfort.  She has no new neurological symptoms.  Her family noticed the swelling and brought her to ER.  She is currently talking well and on room air, but having trouble swallowing and feels like her neck is tight.  Review of Systems:  A 10 point review of systems is negative, except for the pertinent positives and negatives detailed in the HPI.  Past Medical History: Past Medical History:  Diagnosis Date   Arthritis    Breast cancer ([place]) 2014   Bilateral- radiation   Breast cyst    Family history of breast cancer    Family history of lung cancer    Family history of ovarian cancer    Family history of prostate cancer    Hyperlipidemia    Hypertension    Personal history of breast cancer    Personal history of radiation therapy 2014   Bilateral lumpectomy    Past Surgical History: Past Surgical History:  Procedure Laterality Date   ABDOMINAL HYSTERECTOMY  1968   ANTERIOR CERVICAL DECOMP/DISCECTOMY FUSION N/A 07/31/2022   Procedure: C3-4 ANTERIOR CERVICAL DISCECTOMY AND FUSION (GLOBUS HEDRON);  Surgeon: Meade Maw, MD;  Location: [facility] ORS;  Service: Neurosurgery;  Laterality: N/A;   BREAST BIOPSY Bilateral 2014   Bilateral Bx Both Positive c Bilateral Lumpectomy   BREAST CYST ASPIRATION Right 2014   Positive   BREAST LUMPECTOMY Bilateral 2014   f/u radiation    BREAST SURGERY Bilateral July 15 2013   wide excision w  sentinel node biopsy   SENTINEL LYMPH NODE BIOPSY Bilateral     Allergies: Allergies as of 08/01/2022 - Review Complete 08/01/2022  Allergen Reaction Noted   Ultram [tramadol] Itching 07/27/2013    Medications: Current Meds  Medication Sig   acetaminophen (TYLENOL) 500 MG tablet Take 500 mg by mouth 2 (two) times daily.   amLODipine (NORVASC) 5 MG tablet Take 7.5 tablets by mouth daily.   Ascorbic Acid (VITAMIN C) 100 MG tablet Take 100 mg by mouth daily.   Calcium Carb-Cholecalciferol 600-800 MG-UNIT TABS Take 1 tablet by mouth 2 (two) times daily.   donepezil (ARICEPT) 5 MG tablet Take 5 mg by mouth at bedtime.   Multiple Vitamins-Minerals (MULTIVITAMIN WITH MINERALS) tablet Take 1 tablet by mouth daily.   potassium chloride (K-DUR) 10 MEQ tablet Take 1 tablet (10 mEq total) by mouth daily. (Patient taking differently: Take 10 mEq by mouth 2 (two) times daily.)   simvastatin (ZOCOR) 20 MG tablet Take 1 tablet by mouth at bedtime.   timolol (BETIMOL) 0.25 % ophthalmic solution Place 1-2 drops into both eyes daily.   valsartan-hydrochlorothiazide (DIOVAN-HCT) 160-12.5 MG tablet Take 1 tablet by mouth daily.    Social History: Social History   Tobacco Use   Smoking status: Never   Smokeless tobacco: Never  Vaping Use   Vaping Use: Never used  Substance Use Topics   Alcohol use: No   Drug use: No    Family Medical History: Family History  Problem  Relation Age of Onset   Breast cancer Mother        dx late 60s, d. 66   Lung cancer Father        d. 28   Breast cancer Sister        dx 18   Breast cancer Sister        dx 87   Prostate cancer Brother        d. 20   Ovarian cancer Paternal Grandmother        d. 12   Lung cancer Paternal Grandfather        d. 76   Other Daughter        brain tumor, unk type    Physical Examination: Vitals:   08/01/22 2200 08/02/22 0015  BP: (!) 151/76 (!) 140/81  Pulse: 92 80  Resp: (!) 21 16  Temp:  98.7 F (37.1 C)  SpO2:  96% 98%    General: Patient is well developed, well nourished, calm, collected, and in no apparent distress. Attention to examination is appropriate.  Neck:   Substantial bruising.  No drainage.  Her trachea is deviated to the R.  Respiratory: Patient is breathing without any difficulty.  Heart sounds normal no MRG. Chest Clear to Auscultation Bilaterally.   NEUROLOGICAL:     Awake, alert, oriented to person, place, and time.  Speech is clear and fluent. Fund of knowledge is appropriate.   Cranial Nerves: Pupils equal round and reactive to light.  Facial tone is symmetric.  Facial sensation is symmetric. Shoulder shrug is symmetric. Tongue protrusion is midline.    Strength: MAEW  Incision c/d/I. Swelling as above  Medical Decision Making  Imaging: CT Neck 08/01/22 IMPRESSION: 1. Postoperative changes from recent ACDF at C3-4. Extensive swelling and edema throughout the adjacent prevertebral soft tissues, with extension along the left anterior cervical approach. Intermediate to high density complex fluid throughout this region, consistent with blood products/hematoma. No visible active contrast extravasation. Secondary mass effect on the supraglottic airway which is displaced anteriorly and slightly deviated to the right. 2. 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 An abnormally enlarged lymph node would be the primary differential consideration. Further evaluation with dedicated thyroid ultrasound recommended. This could be performed on a nonemergent basis. 3. 7 mm heterogeneous right level 2 lymph node with possible internal necrosis, of uncertain etiology or significance. Attention at follow-up recommended. 4. Few small right upper lobe pulmonary nodules as above, measuring up to 5 mm as above. Per Fleischner Society Guidelines, a non-contrast Chest CT at 12 months is  optional. If performed and the nodule is stable at 12 months, no further follow-up is recommended. These guidelines do not apply to immunocompromised patients and patients with cancer. Follow up in patients with significant comorbidities as clinically warranted. For lung cancer screening, adhere to Lung-RADS guidelines. Reference: Radiology. 2017; 284(1):228-43.   Results discussed by telephone at the time of interpretation on 08/01/2022 at 11:20 pm with provider PATRICK ROBINSON.     Electronically Signed   By: Jeannine Boga M.D.   On: 08/01/2022 23:42  I have personally reviewed the images and agree with the above interpretation.  Assessment and Plan: Ms. Strayer is a pleasant 81 y.o. female with findings concerning for a postoperative hematoma causing tracheal deviation.  Though she is currently protecting her airway, the CT findings, neck "tightness," and increasing swelling are worrisome.  I  have recommended a washout of the hematoma with drain placement.  I reviewed with the patient and family that I am concerned if we watch this she will have an emergency situation where her airway may be compromised.    I discussed the planned procedure at length with the patient, including the risks, benefits, alternatives, and indications. The risks discussed include but are not limited to bleeding, infection, need for reoperation, spinal fluid leak, stroke, vision loss, anesthetic complication, coma, paralysis, and even death. I also described in detail that improvement was not guaranteed.  The patient expressed understanding of these risks, and asked that we proceed with surgery. I described the surgery in layman's terms, and gave ample opportunity for questions, which were answered to the best of my ability.  lving me in the care of this patient.      Barre Aydelott K. Izora Ribas MD, [facility] Neurosurgery

## 2022-08-02 NOTE — Op Note (Signed)
Indications: ESTHER BRADSTREET underwent  anterior cervical discectomy and fusion on 07/31/22.  She suffered worsening neck swelling and was found to have a postoperative clot in her neck.  Due to increasing size of the clot, surgery was recommended for evacuation.  Findings: prevertebral hematoma.  Preoperative Diagnosis: Cervical hematoma, post-operative Postoperative Diagnosis: same   EBL: 50 ml IVF: see AR ml Drains: 1 placed Disposition: Intubated and Stable to PACU Complications: none  A foley catheter was placed.   Preoperative Note:   Risks of surgery discussed include: infection, bleeding, stroke, coma, death, paralysis, CSF leak, nerve/spinal cord injury, numbness, tingling, weakness, complex regional pain syndrome, recurrent stenosis and/or disc herniation, vascular injury, development of instability, neck/back pain, need for further surgery, persistent symptoms, development of deformity, and the risks of anesthesia. The patient understood these risks and agreed to proceed.  Operative Note:   Procedure:  1) Exploration of anterior neck wound, evacuation of hematoma.  Procedure: After obtaining informed consent, the patient taken to the operating room, placed in supine position, general anesthesia induced.  The patient had a small shoulder roll placed behind their shoulders.  The patient received preop antibiotics, then the prior incision was prepped and draped.  The incision was opened.  The platysma was opened. The dissection plane was entered, where a hematoma was noted deep to the prevertebral fascia.  This was evacuated with suction until the previously placed plate was identified.  The site was irrigated, then all active bleeding stopped with bipolar coagulation.  The site was irrigated again, then a drain placed.  The incision was closed with 3-0 vicryl and steristrips.   Sponge and pattie counts were correct at the end of the procedure.    Meade Maw  MD

## 2022-08-02 NOTE — Consult Note (Addendum)
Referring Physician:  No referring provider defined for this encounter.  Primary Physician:  Marinda Elk, MD  History of Present Illness: 08/02/2022 Tina Johnston is here today with a chief complaint of new neck swelling this evening after discharge from the hospital today.  She underwent an anterior cervical discectomy and fusion yesterday, and was doing well and stable for discharge today.  She had some coughing at home, and subsequently noticed new development of swelling in her neck as well as worsening swallowing discomfort.  She has no new neurological symptoms.  Her family noticed the swelling and brought her to ER.  She is currently talking well and on room air, but having trouble swallowing and feels like her neck is tight.  Review of Systems:  A 10 point review of systems is negative, except for the pertinent positives and negatives detailed in the HPI.  Past Medical History: Past Medical History:  Diagnosis Date   Arthritis    Breast cancer ([place]) 2014   Bilateral- radiation   Breast cyst    Family history of breast cancer    Family history of lung cancer    Family history of ovarian cancer    Family history of prostate cancer    Hyperlipidemia    Hypertension    Personal history of breast cancer    Personal history of radiation therapy 2014   Bilateral lumpectomy    Past Surgical History: Past Surgical History:  Procedure Laterality Date   ABDOMINAL HYSTERECTOMY  1968   ANTERIOR CERVICAL DECOMP/DISCECTOMY FUSION N/A 07/31/2022   Procedure: C3-4 ANTERIOR CERVICAL DISCECTOMY AND FUSION (GLOBUS HEDRON);  Surgeon: Meade Maw, MD;  Location: [facility] ORS;  Service: Neurosurgery;  Laterality: N/A;   BREAST BIOPSY Bilateral 2014   Bilateral Bx Both Positive c Bilateral Lumpectomy   BREAST CYST ASPIRATION Right 2014   Positive   BREAST LUMPECTOMY Bilateral 2014   f/u radiation    BREAST SURGERY Bilateral July 15 2013   wide excision w  sentinel node biopsy   SENTINEL LYMPH NODE BIOPSY Bilateral     Allergies: Allergies as of 08/01/2022 - Review Complete 08/01/2022  Allergen Reaction Noted   Ultram [tramadol] Itching 07/27/2013    Medications: Current Meds  Medication Sig   acetaminophen (TYLENOL) 500 MG tablet Take 500 mg by mouth 2 (two) times daily.   amLODipine (NORVASC) 5 MG tablet Take 7.5 tablets by mouth daily.   Ascorbic Acid (VITAMIN C) 100 MG tablet Take 100 mg by mouth daily.   Calcium Carb-Cholecalciferol 600-800 MG-UNIT TABS Take 1 tablet by mouth 2 (two) times daily.   donepezil (ARICEPT) 5 MG tablet Take 5 mg by mouth at bedtime.   Multiple Vitamins-Minerals (MULTIVITAMIN WITH MINERALS) tablet Take 1 tablet by mouth daily.   potassium chloride (K-DUR) 10 MEQ tablet Take 1 tablet (10 mEq total) by mouth daily. (Patient taking differently: Take 10 mEq by mouth 2 (two) times daily.)   simvastatin (ZOCOR) 20 MG tablet Take 1 tablet by mouth at bedtime.   timolol (BETIMOL) 0.25 % ophthalmic solution Place 1-2 drops into both eyes daily.   valsartan-hydrochlorothiazide (DIOVAN-HCT) 160-12.5 MG tablet Take 1 tablet by mouth daily.    Social History: Social History   Tobacco Use   Smoking status: Never   Smokeless tobacco: Never  Vaping Use   Vaping Use: Never used  Substance Use Topics   Alcohol use: No   Drug use: No    Family Medical History: Family History  Problem  Relation Age of Onset   Breast cancer Mother        dx late 46s, d. 47   Lung cancer Father        d. 59   Breast cancer Sister        dx 42   Breast cancer Sister        dx 70   Prostate cancer Brother        d. 73   Ovarian cancer Paternal Grandmother        d. 52   Lung cancer Paternal Grandfather        d. 86   Other Daughter        brain tumor, unk type    Physical Examination: Vitals:   08/01/22 2200 08/02/22 0015  BP: (!) 151/76 (!) 140/81  Pulse: 92 80  Resp: (!) 21 16  Temp:  98.7 F (37.1 C)  SpO2:  96% 98%    General: Patient is well developed, well nourished, calm, collected, and in no apparent distress. Attention to examination is appropriate.  Neck:   Substantial bruising.  No drainage.  Her trachea is deviated to the R.  Respiratory: Patient is breathing without any difficulty.  Heart sounds normal no MRG. Chest Clear to Auscultation Bilaterally.   NEUROLOGICAL:     Awake, alert, oriented to person, place, and time.  Speech is clear and fluent. Fund of knowledge is appropriate.   Cranial Nerves: Pupils equal round and reactive to light.  Facial tone is symmetric.  Facial sensation is symmetric. Shoulder shrug is symmetric. Tongue protrusion is midline.    Strength: MAEW  Incision c/d/I. Swelling as above  Medical Decision Making  Imaging: CT Neck 08/01/22 IMPRESSION: 1. Postoperative changes from recent ACDF at C3-4. Extensive swelling and edema throughout the adjacent prevertebral soft tissues, with extension along the left anterior cervical approach. Intermediate to high density complex fluid throughout this region, consistent with blood products/hematoma. No visible active contrast extravasation. Secondary mass effect on the supraglottic airway which is displaced anteriorly and slightly deviated to the right. 2. 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 An abnormally enlarged lymph node would be the primary differential consideration. Further evaluation with dedicated thyroid ultrasound recommended. This could be performed on a nonemergent basis. 3. 7 mm heterogeneous right level 2 lymph node with possible internal necrosis, of uncertain etiology or significance. Attention at follow-up recommended. 4. Few small right upper lobe pulmonary nodules as above, measuring up to 5 mm as above. Per Fleischner Society Guidelines, a non-contrast Chest CT at 12 months is  optional. If performed and the nodule is stable at 12 months, no further follow-up is recommended. These guidelines do not apply to immunocompromised patients and patients with cancer. Follow up in patients with significant comorbidities as clinically warranted. For lung cancer screening, adhere to Lung-RADS guidelines. Reference: Radiology. 2017; 284(1):228-43.   Results discussed by telephone at the time of interpretation on 08/01/2022 at 11:20 pm with provider PATRICK ROBINSON.     Electronically Signed   By: Jeannine Boga M.D.   On: 08/01/2022 23:42  I have personally reviewed the images and agree with the above interpretation.  Assessment and Plan: Ms. Pounds is a pleasant 81 y.o. female with findings concerning for a postoperative hematoma causing tracheal deviation.  Though she is currently protecting her airway, the CT findings, neck "tightness," and increasing swelling are worrisome.  I  have recommended a washout of the hematoma with drain placement.  I reviewed with the patient and family that I am concerned if we watch this she will have an emergency situation where her airway may be compromised.  Due to the emergent nature of the condition, waiting for 8 hours since her last meal is higher risk than proceeding.  I discussed the planned procedure at length with the patient, including the risks, benefits, alternatives, and indications. The risks discussed include but are not limited to bleeding, infection, need for reoperation, spinal fluid leak, stroke, vision loss, anesthetic complication, coma, paralysis, and even death. I also described in detail that improvement was not guaranteed.  The patient expressed understanding of these risks, and asked that we proceed with surgery. I described the surgery in layman's terms, and gave ample opportunity for questions, which were answered to the best of my ability.  lving me in the care of this patient.      Alexi Geibel K.  Izora Ribas MD, [facility] Neurosurgery

## 2022-08-02 NOTE — Transfer of Care (Signed)
Immediate Anesthesia Transfer of Care Note  Patient: Tina Johnston  Procedure(s) Performed: ANTERIOR CERVICAL Wound exploration and hematoma evacuation  Patient Location: ICU  Anesthesia Type:General  Level of Consciousness: Patient remains intubated per anesthesia plan  Airway & Oxygen Therapy: Patient remains intubated per anesthesia plan  Post-op Assessment: Report given to RN and Post -op Vital signs reviewed and stable  Post vital signs: Reviewed  Last Vitals:  Vitals Value Taken Time  BP 145/99 08/02/22 0245  Temp    Pulse 90 08/02/22 0245  Resp 13 08/02/22 0246  SpO2 99 % 08/02/22 0245  Vitals shown include unvalidated device data.  Last Pain:  Vitals:   08/02/22 0015  TempSrc: Oral  PainSc:          Complications: No notable events documented.

## 2022-08-02 NOTE — Plan of Care (Signed)

## 2022-08-02 NOTE — Progress Notes (Signed)
0800 patient alert on vent following commands 0930 patient placed on wean 1005 patient extubated 1100 patient passed swallow  1600 patient transferred to new bed patient able to stand and step to bed with no complications 7510 patient foley removed and external cath placed

## 2022-08-02 NOTE — Consult Note (Signed)
[place] for Electrolyte Monitoring and Replacement   Recent Labs: Potassium (mmol/L)  Date Value  08/02/2022 3.6  11/03/2014 3.3 (L)   Magnesium (mg/dL)  Date Value  08/02/2022 2.1   Calcium (mg/dL)  Date Value  08/02/2022 9.1   Calcium, Total (mg/dL)  Date Value  11/03/2014 9.1   Albumin (g/dL)  Date Value  08/01/2022 4.0  11/23/2014 3.7   Sodium (mmol/L)  Date Value  08/02/2022 138  11/03/2014 142   Assessment: Patient is an 81 y/o F with medical history including bilateral breast cancer s/p bilateral lumpectomy and radiation, HTN, HLD, cervical myelopathy / stenosis s/p C3-C4 ACDF on 07/31/22 who is admitted with post-operative neck hematoma s/p evacuation with drain placement. Pharmacy consulted to assist with electrolyte monitoring and replacement as indicated.  Goal of Therapy:  Electrolytes within normal limits  Plan:  --No electrolyte replacement indicated at this time --Follow-up electrolytes with AM labs tomorrow  Benita Gutter 08/02/2022 7:56 AM

## 2022-08-02 NOTE — Interval H&P Note (Deleted)
History and Physical Interval Note:  08/02/2022 12:39 AM  Tina Johnston  has presented today for surgery, with the diagnosis of hematoma from surgery.  The various methods of treatment have been discussed with the patient and family. After consideration of risks, benefits and other options for treatment, the patient has consented to  Procedure(s): ANTERIOR CERVICAL DECOMPRESSION/DISCECTOMY FUSION 1 LEVEL (N/A) as a surgical intervention.  The patient's history has been reviewed, patient examined, no change in status, stable for surgery.  I have reviewed the patient's chart and labs.  Questions were answered to the patient's satisfaction.     Vi Biddinger

## 2022-08-02 NOTE — Interval H&P Note (Signed)
History and Physical Interval Note:  08/02/2022 12:40 AM  Tina Johnston  has presented today for surgery, with the diagnosis of hematoma from surgery.  The various methods of treatment have been discussed with the patient and family. After consideration of risks, benefits and other options for treatment, the patient has consented to exploration and washout of anterior cervical hematoma as a surgical intervention.  The patient's history has been reviewed, patient examined, no change in status, stable for surgery.  I have reviewed the patient's chart and labs.  Questions were answered to the patient's satisfaction.     Severa Jeremiah

## 2022-08-02 NOTE — Progress Notes (Signed)
Extubation order written.  Cuff leak noted.  Patient extubated and placed on 2lpm [place]. Patient tolerated well.  Will continue to monitor.

## 2022-08-02 NOTE — Progress Notes (Signed)
    Attending Progress Note  History: Tina Johnston is s/p C3-4 ACDF with readmission for neck swelling and cervical hematoma. She was taken back to the OR on the evening of 08/01/22 for evacuation.   POD1: Pt remains intubated but off sedation this morning.   Physical Exam: Vitals:   08/02/22 0700 08/02/22 0724  BP: (!) 115/58   Pulse: 75   Resp: 16   Temp:  98 F (36.7 C)  SpO2: 98%     Alert  CNI Follows commands x 4.  Trachea is midline  JP in place  Data:  Recent Labs  Lab 08/01/22 2132 08/02/22 0649  NA 138 138  K 3.4* 3.6  CL 105 107  CO2 26 25  BUN 15 15  CREATININE 0.82 0.80  GLUCOSE 147* 181*  CALCIUM 9.9 9.1   Recent Labs  Lab 08/01/22 2132  AST 27  ALT 21  ALKPHOS 73     Recent Labs  Lab 08/01/22 2132 08/02/22 0649  WBC 15.2* 11.6*  HGB 12.3 11.7*  HCT 38.3 36.2  PLT 406* 387   Recent Labs  Lab 08/02/22 0341  INR 1.0         Other tests/results:  08/01/22 CT neck  IMPRESSION: 1. Postoperative changes from recent ACDF at C3-4. Extensive swelling and edema throughout the adjacent prevertebral soft tissues, with extension along the left anterior cervical approach. Intermediate to high density complex fluid throughout this region, consistent with blood products/hematoma. No visible active contrast extravasation. Secondary mass effect on the supraglottic airway which is displaced anteriorly and slightly deviated to the right. 2. 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 An abnormally enlarged lymph node would be the primary differential consideration. Further evaluation with dedicated thyroid ultrasound recommended. This could be performed on a nonemergent basis. 3. 7 mm heterogeneous right level 2 lymph node with possible internal necrosis, of uncertain etiology or significance. Attention at follow-up recommended. 4. Few small  right upper lobe pulmonary nodules as above, measuring up to 5 mm as above. Per Fleischner Society Guidelines, a non-contrast Chest CT at 12 months is optional. If performed and the nodule is stable at 12 months, no further follow-up is recommended. These guidelines do not apply to immunocompromised patients and patients with cancer. Follow up in patients with significant comorbidities as clinically warranted. For lung cancer screening, adhere to Lung-RADS guidelines. Reference: Radiology. 2017; 284(1):228-43.   Results discussed by telephone at the time of interpretation on 08/01/2022 at 11:20 pm with provider PATRICK ROBINSON.     Electronically Signed   By: Jeannine Boga M.D.   On: 08/01/2022 23:42    Assessment/Plan:  Tina Johnston is a 81 y.o with cervical myelopathy s/p C3-4 ACDF on 07/31/22 presenting with neck swelling and found to have cervical hematoma and paravertebral swelling s/p evaluation on 08/01/22  - mobilize - pain control - Keep JP in place - OK to extubate from neurosurgical standpoint.  Cooper Render PA-C Department of Neurosurgery

## 2022-08-02 NOTE — H&P (Addendum)
NAME:  Tina Johnston, MRN:  [id number], DOB:  [birth year], LOS: 0 ADMISSION DATE:  08/01/2022, CONSULTATION DATE:  08/02/2022 REFERRING MD:  Merlyn Lot  CHIEF COMPLAINT:  Neck Swelling    HPI  81 y.o with significant PMH of bilateral breast cancer s/p bilateral lumpectomy and radiation, HTN. HLD, Cervical myelopathy/Stenosis s/p C3-C4 ACDF 07/31/22 who presented to the ED with chief complaints of neck swelling, difficulty swallowing, odynophagia and sensation of throat closing up.  Patient was recently admitted to the hospital on 8/9 for symptomatic cervical stenosis and underwent  successful C3-C4 ACDF. She was discharged on 8/10 at 4pm and later that evening her daughter noticed swelling and purplish discoloration on her neck. Patient states she tried to drink soup but choked on it and had difficulty and pain with swallowing. She also described sensation of her throat closing up.  ED Course: In the emergency department, the temperature was 37.1C, the heart rate 84 beats/minute, the blood pressure 180/77 mm Hg, the respiratory rate 18 breaths/minute, and the oxygen saturation 99% on RA. A STAT CT of the neck was obtained which showed extensive swelling and hematoma along the left anterior cervical region with secondary mass effect on the supraglottic airway which is displaced and slightly deviated to the right. Finding were discussed with on call Neurosurgery Dr. Cari Caraway who recommended initiation of steroid and will take patient urgently to OR for washout of the hematoma with drain placement. PCCM consulted for admission and medical management.  Pertinent Labs/Diagnostics Findings: Chemistry:Na+/ K+:138/3.4  Glucose: 147 CBC: WBC: 15.2   Past Medical History  Bilateral breast cancer s/p bilateral lumpectomy and radiation, HTN. HLD, Cervical myelopathy/Stenosis s/p C3-C4 ACDF 07/31/22  Significant Hospital Events   8/11:Admitted to ICU with postoperative neck hematoma s/p evacuation  with drain placement  Consults:  Neurosurgery  Procedures:  8/11; Plan for hematoma washout and drain placement  Significant Diagnostic Tests:   Micro Data:  None   Antimicrobials:  None  OBJECTIVE  Blood pressure (!) 151/76, pulse 92, temperature 98.8 F (37.1 C), temperature source Oral, resp. rate (!) 21, height _0  (1.6 m), weight 64 kg, SpO2 96 %.       No intake or output data in the 24 hours ending 08/02/22 0006 Filed Weights   08/01/22 2107  Weight: 64 kg   Physical Examination  GENERAL: 81 year-old critically ill patient lying in the bed intubated and sedated EYES: Pupils equal, round, reactive to light and accommodation. No scleral icterus. Extraocular muscles intact.  HEENT: Head atraumatic, normocephalic. Oropharynx and nasopharynx clear.  NECK: Edema L>Right with  mild tracheal deviation.  no jugular venous distention. No thyroid enlargement, tenderness on the left  LUNGS: Normal breath sounds bilaterally, no wheezing, rales,rhonchi or crepitation. No use of accessory muscles of respiration.  CARDIOVASCULAR: S1, S2 normal. No murmurs, rubs, or gallops.  ABDOMEN: Soft, nontender, nondistended. Bowel sounds present. No organomegaly or mass.  EXTREMITIES: No pedal edema, cyanosis, or clubbing.  NEUROLOGIC: Cranial nerves II through XII are intact.  Muscle strength 5/5 in all extremities. Sensation intact. Gait not checked.  PSYCHIATRIC: The patient is intubated and sedated SKIN: No obvious rash, lesion, or ulcer.      Labs/imaging that I havepersonally reviewed  (right click and "Reselect all SmartList Selections" daily)     Labs   CBC: Recent Labs  Lab 08/01/22 2132  WBC 15.2*  NEUTROABS 12.0*  HGB 12.3  HCT 38.3  MCV 86.5  PLT 406*  Basic Metabolic Panel: Recent Labs  Lab 08/01/22 2132  NA 138  K 3.4*  CL 105  CO2 26  GLUCOSE 147*  BUN 15  CREATININE 0.82  CALCIUM 9.9   GFR: Estimated Creatinine Clearance: 46.8 mL/min (by C-G  formula based on SCr of 0.82 mg/dL). Recent Labs  Lab 08/01/22 2132  WBC 15.2*    Liver Function Tests: Recent Labs  Lab 08/01/22 2132  AST 27  ALT 21  ALKPHOS 73  BILITOT 0.5  PROT 7.6  ALBUMIN 4.0   No results for input(s): "LIPASE", "AMYLASE" in the last 168 hours. No results for input(s): "AMMONIA" in the last 168 hours.  ABG No results found for: "PHART", "PCO2ART", "PO2ART", "HCO3", "TCO2", "ACIDBASEDEF", "O2SAT"   Coagulation Profile: No results for input(s): "INR", "PROTIME" in the last 168 hours.  Cardiac Enzymes: No results for input(s): "CKTOTAL", "CKMB", "CKMBINDEX", "TROPONINI" in the last 168 hours.  HbA1C: No results found for: "HGBA1C"  CBG: No results for input(s): "GLUCAP" in the last 168 hours.  Review of Systems:   UNABLE TO OBTAIN DUE TO PATIENT INTUBATED AND SEDATED  Past Medical History  She,  has a past medical history of Arthritis, Breast cancer ([place]) (2014), Breast cyst, Family history of breast cancer, Family history of lung cancer, Family history of ovarian cancer, Family history of prostate cancer, Hyperlipidemia, Hypertension, Personal history of breast cancer, and Personal history of radiation therapy (2014).   Surgical History    Past Surgical History:  Procedure Laterality Date   ABDOMINAL HYSTERECTOMY  1968   ANTERIOR CERVICAL DECOMP/DISCECTOMY FUSION N/A 07/31/2022   Procedure: C3-4 ANTERIOR CERVICAL DISCECTOMY AND FUSION (GLOBUS HEDRON);  Surgeon: Meade Maw, MD;  Location: [facility] ORS;  Service: Neurosurgery;  Laterality: N/A;   BREAST BIOPSY Bilateral 2014   Bilateral Bx Both Positive c Bilateral Lumpectomy   BREAST CYST ASPIRATION Right 2014   Positive   BREAST LUMPECTOMY Bilateral 2014   f/u radiation    BREAST SURGERY Bilateral July 15 2013   wide excision w sentinel node biopsy   SENTINEL LYMPH NODE BIOPSY Bilateral      Social History   reports that she has never smoked. She has never used smokeless tobacco.  She reports that she does not drink alcohol and does not use drugs.   Family History   Her family history includes Breast cancer in her mother, sister, and sister; Lung cancer in her father and paternal grandfather; Other in her daughter; Ovarian cancer in her paternal grandmother; Prostate cancer in her brother.   Allergies Allergies  Allergen Reactions   Ultram [Tramadol] Itching     Home Medications  Prior to Admission medications   Medication Sig Start Date End Date Taking? Authorizing Provider  acetaminophen (TYLENOL) 500 MG tablet Take 500 mg by mouth 2 (two) times daily.   Yes [provider]  amLODipine (NORVASC) 5 MG tablet Take 7.5 tablets by mouth daily.   Yes [provider]  Ascorbic Acid (VITAMIN C) 100 MG tablet Take 100 mg by mouth daily.   Yes [provider]  Calcium Carb-Cholecalciferol 600-800 MG-UNIT TABS Take 1 tablet by mouth 2 (two) times daily.   Yes [provider]  donepezil (ARICEPT) 5 MG tablet Take 5 mg by mouth at bedtime.   Yes [provider]  Multiple Vitamins-Minerals (MULTIVITAMIN WITH MINERALS) tablet Take 1 tablet by mouth daily. 07/06/14  Yes [provider]  potassium chloride (K-DUR) 10 MEQ tablet Take 1 tablet (10 mEq total) by  mouth daily. Patient taking differently: Take 10 mEq by mouth 2 (two) times daily. 08/30/16  Yes Triplett, Cari B, FNP  simvastatin (ZOCOR) 20 MG tablet Take 1 tablet by mouth at bedtime.   Yes [provider]  timolol (BETIMOL) 0.25 % ophthalmic solution Place 1-2 drops into both eyes daily.   Yes [provider]  valsartan-hydrochlorothiazide (DIOVAN-HCT) 160-12.5 MG tablet Take 1 tablet by mouth daily. 03/01/20 08/01/22 Yes [provider]  fluticasone (FLONASE) 50 MCG/ACT nasal spray Place 1 spray into both nostrils daily.    [provider]  methocarbamol (ROBAXIN) 500 MG tablet Take 1 tablet (500 mg total) by mouth every 8 (eight) hours  as needed for muscle spasms. 08/01/22   Loleta Dicker, PA  oxyCODONE (OXY IR/ROXICODONE) 5 MG immediate release tablet Take 1 tablet (5 mg total) by mouth every 4 (four) hours as needed for up to 5 days for severe pain ((score 4 to 6)). 08/01/22 08/06/22  Loleta Dicker, PA  senna (SENOKOT) 8.6 MG TABS tablet Take 1 tablet (8.6 mg total) by mouth daily as needed for mild constipation. 08/01/22   Loleta Dicker, PA   Scheduled Meds: Continuous Infusions: PRN Meds:.0.9 % irrigation (POUR BTL), [MAR Hold] docusate sodium, [MAR Hold] polyethylene glycol, Surgiflo with Thrombin (Hemostatic Matrix Kit) Optime, Surgiphor Wound Irrigation System - Optime 450 mL irrigation  Active Hospital Problem list     Assessment & Plan:   Postoperative Anterior Cervical Hematoma with Tracheal Deviation Hx Cervical Stenosis s/p C3-C4 ACDF 8/9 S/p Hematoma evacuation and drain placement POD # 0 -Monitor s/s recurrent hematoma, infection -PRN pain management -Dexamethasone 4 mg Q6 -Keep NPO -Neurosurgery following  Postoperative Mechanical Ventilation Requirement for airway protection -Continue ventilator support & lung protective strategies -Head of bed elevated 30 degrees, VAP protocol in place -attempt to wean sedation and attempt SBT later today -consider extubating if passes SBT and is more alert and able to protect airway -Limit sedation for RASS 0 to -1 -Wean PEEP/FiO2 for SpO2 >92% -VAP bundle in place -Daily SAT and SBT  Hypertension SBPs >140 -Holding home Amlodipine and Valsartan-HCTZ while NPO -Start hydralazine 10 mg every 6 hours prn for SBP >180  Hx of Bilateral Breast Cancer s/p bil. Lumpectomy and radiation -stable per her oncologist no evidence of recurrence -Follows with Dr. Rogue Bussing  Mild Cognitive Impairment -On Aricept -Avoid sedative as able -promote sleep wake pattern while inpatient  Best practice:  Diet:  NPO Pain/Anxiety/Delirium protocol (if indicated):  Yes (RASS goal 0) VAP protocol (if indicated): Yes DVT prophylaxis: Contraindicated GI prophylaxis: N/A Glucose control:  SSI No Central venous access:  N/A Arterial line:  N/A Foley:  Yes, and it is still needed Mobility:  bed rest  PT consulted: N/A Last date of multidisciplinary goals of care discussion [8/11] Code Status:  full code Disposition: ICU   = Goals of Care = Code Status Order: FULL  Primary Emergency Contact: Jennings,Cheryl, Home Phone: [phone] Wishes to pursue full aggressive treatment and intervention options, including CPR and intubation, but goals of care will be addressed on going with family if that should become necessary.  Critical care time: 45 minutes       Rufina Falco, DNP, CCRN, FNP-C, AGACNP-BC Acute Care Nurse Practitioner [facility]  PCCM on call pager [phone] until 7 am

## 2022-08-02 NOTE — Anesthesia Preprocedure Evaluation (Addendum)
Anesthesia Evaluation  Patient identified by MRN, date of birth, ID band Patient awake    Reviewed: Allergy & Precautions, NPO status , Patient's Chart, lab work & pertinent test results  History of Anesthesia Complications Negative for: history of anesthetic complications  Airway Mallampati: IV   Neck ROM: Full    Dental  (+) Lower Dentures, Upper Dentures   Pulmonary neg pulmonary ROS,    Pulmonary exam normal breath sounds clear to auscultation       Cardiovascular hypertension, Normal cardiovascular exam Rhythm:Regular Rate:Normal  ECG 07/19/22:  Normal sinus rhythm Right atrial enlargement Left axis deviation Non-specific intra-ventricular conduction block Minimal voltage criteria for LVH, may be normal variant ( Cornell product ) Cannot rule out Anteroseptal infarct (cited on or before 27-Dec-2018) T wave abnormality, consider lateral ischemia Abnormal ECG When compared with ECG of 27-Dec-2018 10:22, No significant change was found   Neuro/Psych negative neurological ROS     GI/Hepatic negative GI ROS,   Endo/Other  negative endocrine ROS  Renal/GU negative Renal ROS     Musculoskeletal  (+) Arthritis ,   Abdominal   Peds  Hematology negative hematology ROS (+)   Anesthesia Other Findings   Reproductive/Obstetrics                            Anesthesia Physical Anesthesia Plan  ASA: 2 and emergent  Anesthesia Plan: General   Post-op Pain Management:    Induction: Intravenous and Rapid sequence  PONV Risk Score and Plan: 3 and Ondansetron, Dexamethasone and Treatment may vary due to age or medical condition  Airway Management Planned: Oral ETT  Additional Equipment:   Intra-op Plan:   Post-operative Plan: Post-operative intubation/ventilation  Informed Consent: I have reviewed the patients History and Physical, chart, labs and discussed the procedure including the  risks, benefits and alternatives for the proposed anesthesia with the patient or authorized representative who has indicated his/her understanding and acceptance.     Dental advisory given  Plan Discussed with: CRNA  Anesthesia Plan Comments: (Patient consented for risks of anesthesia including but not limited to:  - adverse reactions to medications - damage to eyes, teeth, lips or other oral mucosa - nerve damage due to positioning  - sore throat or hoarseness - damage to heart, brain, nerves, lungs, other parts of body or loss of life  Informed patient about role of CRNA in peri- and intra-operative care.  Patient voiced understanding.)       Anesthesia Quick Evaluation

## 2022-08-02 NOTE — Anesthesia Procedure Notes (Signed)
Procedure Name: Intubation Date/Time: 08/02/2022 1:38 AM  Performed by: Rolla Plate, CRNAPre-anesthesia Checklist: Patient identified, Patient being monitored, Timeout performed, Emergency Drugs available and Suction available Patient Re-evaluated:Patient Re-evaluated prior to induction Oxygen Delivery Method: Circle system utilized Preoxygenation: Pre-oxygenation with 100% oxygen Induction Type: IV induction Laryngoscope Size: 3 and McGraph Grade View: Grade I Tube type: Oral Tube size: 6.5 mm Number of attempts: 1 Airway Equipment and Method: Stylet and Video-laryngoscopy Placement Confirmation: ETT inserted through vocal cords under direct vision, positive ETCO2 and breath sounds checked- equal and bilateral Secured at: 20 cm Tube secured with: Tape Dental Injury: Teeth and Oropharynx as per pre-operative assessment

## 2022-08-02 NOTE — Anesthesia Postprocedure Evaluation (Signed)
Anesthesia Post Note  Patient: Tina Johnston  Procedure(s) Performed: ANTERIOR CERVICAL Wound exploration and hematoma evacuation  Patient location during evaluation: ICU Anesthesia Type: General Level of consciousness: sedated Pain management: pain level controlled Vital Signs Assessment: post-procedure vital signs reviewed and stable Respiratory status: patient remains intubated per anesthesia plan Cardiovascular status: blood pressure returned to baseline Postop Assessment: no apparent nausea or vomiting Anesthetic complications: no   No notable events documented.   Last Vitals:  Vitals:   08/02/22 0015 08/02/22 0245  BP: (!) 140/81 (!) 145/99  Pulse: 80 90  Resp: 16 10  Temp: 37.1 C   SpO2: 98% 99%    Last Pain:  Vitals:   08/02/22 0015  TempSrc: Oral  PainSc:                  Darrin Nipper

## 2022-08-03 DIAGNOSIS — S1093XA Contusion of unspecified part of neck, initial encounter: Secondary | ICD-10-CM

## 2022-08-03 LAB — BASIC METABOLIC PANEL
Anion gap: 7 (ref 5–15)
BUN: 27 mg/dL — ABNORMAL HIGH (ref 8–23)
CO2: 26 mmol/L (ref 22–32)
Calcium: 9.4 mg/dL (ref 8.9–10.3)
Chloride: 104 mmol/L (ref 98–111)
Creatinine, Ser: 0.75 mg/dL (ref 0.44–1.00)
GFR, Estimated: >60 mL/min (ref >60)
Glucose, Bld: 150 mg/dL — ABNORMAL HIGH (ref 70–99)
Potassium: 4.1 mmol/L (ref 3.5–5.1)
Sodium: 137 mmol/L (ref 135–145)

## 2022-08-03 LAB — CBC
HCT: 34.8 % — ABNORMAL LOW (ref 36.0–46.0)
Hemoglobin: 11.1 g/dL — ABNORMAL LOW (ref 12.0–15.0)
MCH: 27.6 pg (ref 26.0–34.0)
MCHC: 31.9 g/dL (ref 30.0–36.0)
MCV: 86.6 fL (ref 80.0–100.0)
Platelets: 391 10*3/uL (ref 150–400)
RBC: 4.02 MIL/uL (ref 3.87–5.11)
RDW: 15.2 % (ref 11.5–15.5)
WBC: 11.3 10*3/uL — ABNORMAL HIGH (ref 4.0–10.5)
nRBC: 0 % (ref 0.0–0.2)

## 2022-08-03 LAB — CALCIUM, IONIZED: Calcium, Ionized, Serum: 5.2 mg/dL (ref 4.5–5.6)

## 2022-08-03 LAB — MAGNESIUM: Magnesium: 2.4 mg/dL (ref 1.7–2.4)

## 2022-08-03 LAB — PHOSPHORUS: Phosphorus: 3 mg/dL (ref 2.5–4.6)

## 2022-08-03 MED ORDER — TIMOLOL MALEATE 0.25 % OP SOLN
1.0000 [drp] | Freq: Every day | OPHTHALMIC | Status: DC
Start: 2022-08-03 — End: 2022-08-04
  Administered 2022-08-04: 1 [drp] via OPHTHALMIC
  Filled 2022-08-03: qty 5

## 2022-08-03 MED ORDER — VALSARTAN-HYDROCHLOROTHIAZIDE 160-12.5 MG PO TABS: 1.0000 | ORAL_TABLET | Freq: Every day | ORAL | Status: DC

## 2022-08-03 MED ORDER — AMLODIPINE BESYLATE 5 MG PO TABS
7.5000 mg | ORAL_TABLET | Freq: Every day | ORAL | Status: DC
  Administered 2022-08-04: 7.5 mg via ORAL
  Filled 2022-08-03: qty 2

## 2022-08-03 MED ORDER — DONEPEZIL HCL 5 MG PO TABS
5.0000 mg | ORAL_TABLET | Freq: Every day | ORAL | Status: DC
  Administered 2022-08-03: 5 mg via ORAL
  Filled 2022-08-03: qty 1

## 2022-08-03 MED ORDER — IRBESARTAN 150 MG PO TABS
150.0000 mg | ORAL_TABLET | Freq: Every day | ORAL | Status: DC
  Administered 2022-08-04: 150 mg via ORAL
  Filled 2022-08-03: qty 1

## 2022-08-03 MED ORDER — ORAL CARE MOUTH RINSE: 15.0000 mL | OROMUCOSAL | Status: DC | PRN

## 2022-08-03 MED ORDER — HYDROCHLOROTHIAZIDE 12.5 MG PO TABS
12.5000 mg | ORAL_TABLET | Freq: Every day | ORAL | Status: DC
  Administered 2022-08-04: 12.5 mg via ORAL
  Filled 2022-08-03: qty 1

## 2022-08-03 NOTE — Progress Notes (Signed)
  PROGRESS NOTE    Tina Johnston  LOV:[id number] DOB: April 02, 1941 DOA: 08/01/2022 PCP: Marinda Elk, MD  [id number]  LOS: 1 day   Brief hospital course: No notes on file  Assessment & Plan: Tina Johnston is a 81 y.o. female with significant PMH of bilateral breast cancer s/p bilateral lumpectomy and radiation, HTN, Cervical myelopathy/Stenosis s/p C3-C4 ACDF 07/31/22 who presented to the ED with chief complaints of neck swelling, difficulty swallowing, odynophagia and sensation of throat closing up.  Pt was found to have a postoperative anterior cervical hematoma compressing the airway prompting emergent intubation and hematoma evacuation with the placement of a JP drain.  Following this, she was admitted to the ICU for continued monitoring and weaning off the ventilator.   Postoperative Anterior Cervical Hematoma with Tracheal Deviation Hx Cervical Stenosis s/p C3-C4 ACDF 8/9 S/p Hematoma evacuation and drain placement  -Neurosurgery following.  Drain removed today   Postoperative Mechanical Ventilation Requirement for airway protection --extubated and on room air.   Hypertension --resume home Amlodipine and Valsartan-HCTZ   Hx of Bilateral Breast Cancer s/p bil. Lumpectomy and radiation -stable per her oncologist no evidence of recurrence -Follows with Dr. Rogue Bussing   Mild Cognitive Impairment -resume Aricept -Avoid sedative as able -promote sleep wake pattern while inpatient    DVT prophylaxis: SCD/Compression stockings Code Status: Full code  Family Communication: sister updated at bedside today Level of care: Med-Surg Dispo:   The patient is from: home Anticipated d/c is to: home Anticipated d/c date is: 1-2 days   Subjective and Interval History:  Drain was taken out today.  Pt reported doing well, eating.    Objective: Vitals:   08/03/22 1400 08/03/22 1500 08/03/22 1600 08/03/22 1704  BP: (!) 140/72  (!) 144/70 (!) 161/77  Pulse: 89   87   Resp: '19 17 18 16  '$ Temp:    98.7 F (37.1 C)  TempSrc:      SpO2: 96%   99%  Weight:      Height:        Intake/Output Summary (Last 24 hours) at 08/03/2022 1940 Last data filed at 08/03/2022 0700 Gross per 24 hour  Intake 240 ml  Output 305 ml  Net -65 ml   Filed Weights   08/01/22 2107 08/02/22 0318  Weight: 64 kg 66.7 kg    Examination:   Constitutional: NAD, AAOx3 HEENT: conjunctivae and lids normal, EOMI, dressing over anterior neck CV: No cyanosis.   RESP: normal respiratory effort, on RA Neuro: II - XII grossly intact.   Psych: Normal mood and affect.  Appropriate judgement and reason   Data Reviewed: I have personally reviewed labs and imaging studies  Time spent: 35 minutes  Enzo Bi, MD [facility] If 7PM-7AM, please contact night-coverage 08/03/2022, 7:40 PM

## 2022-08-03 NOTE — Progress Notes (Signed)
    Attending Progress Note  History: Tina Johnston is s/p C3-4 ACDF with readmission for neck swelling and cervical hematoma. She was taken back to the OR on the morning of 08/02/22 for evacuation.   POD1: Doing well this morning.  POD0: Pt remains intubated but off sedation this morning.   Physical Exam: Vitals:   08/03/22 0900 08/03/22 1000  BP: (!) 173/79 (!) 146/63  Pulse: 84 83  Resp: 20 16  Temp: 98.6 F (37 C)   SpO2: 97% 94%    Alert  CNI Follows commands x 4.  Trachea is midline  Swallowing with mild difficulty.  Data:  Recent Labs  Lab 08/01/22 2132 08/02/22 0649 08/03/22 0339  NA 138 138 137  K 3.4* 3.6 4.1  CL 105 107 104  CO2 '26 25 26  '$ BUN 15 15 27*  CREATININE 0.82 0.80 0.75  GLUCOSE 147* 181* 150*  CALCIUM 9.9 9.1 9.4   Recent Labs  Lab 08/01/22 2132  AST 27  ALT 21  ALKPHOS 73     Recent Labs  Lab 08/01/22 2132 08/02/22 0649 08/03/22 0339  WBC 15.2* 11.6* 11.3*  HGB 12.3 11.7* 11.1*  HCT 38.3 36.2 34.8*  PLT 406* 387 391   Recent Labs  Lab 08/02/22 0341  INR 1.0         Other tests/results:  08/01/22 CT neck  IMPRESSION: 1. Postoperative changes from recent ACDF at C3-4. Extensive swelling and edema throughout the adjacent prevertebral soft tissues, with extension along the left anterior cervical approach. Intermediate to high density complex fluid throughout this region, consistent with blood products/hematoma. No visible active contrast extravasation. Secondary mass effect on the supraglottic airway which is displaced anteriorly and slightly deviated to the right. 2. 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 An abnormally enlarged lymph node would be the primary differential consideration. Further evaluation with dedicated thyroid ultrasound recommended. This could be performed on a nonemergent basis. 3. 7 mm  heterogeneous right level 2 lymph node with possible internal necrosis, of uncertain etiology or significance. Attention at follow-up recommended. 4. Few small right upper lobe pulmonary nodules as above, measuring up to 5 mm as above. Per Fleischner Society Guidelines, a non-contrast Chest CT at 12 months is optional. If performed and the nodule is stable at 12 months, no further follow-up is recommended. These guidelines do not apply to immunocompromised patients and patients with cancer. Follow up in patients with significant comorbidities as clinically warranted. For lung cancer screening, adhere to Lung-RADS guidelines. Reference: Radiology. 2017; 284(1):228-43.   Results discussed by telephone at the time of interpretation on 08/01/2022 at 11:20 pm with provider Tina Johnston.     Electronically Signed   By: Tina Johnston M.D.   On: 08/01/2022 23:42    Assessment/Plan:  Tina Johnston is a 81 y.o with cervical myelopathy s/p C3-4 ACDF on 07/31/22 presenting with neck swelling and found to have cervical hematoma and paravertebral swelling s/p evaluation on 08/01/22  - mobilize - pain control - JP removed today - OK for floor from my standpoint - PTOT  Meade Maw MD Department of Neurosurgery

## 2022-08-03 NOTE — Consult Note (Signed)
[facility] for Electrolyte Monitoring and Replacement   Recent Labs: Potassium (mmol/L)  Date Value  08/03/2022 4.1  11/03/2014 3.3 (L)   Magnesium (mg/dL)  Date Value  08/03/2022 2.4   Calcium (mg/dL)  Date Value  08/03/2022 9.4   Calcium, Total (mg/dL)  Date Value  11/03/2014 9.1   Albumin (g/dL)  Date Value  08/01/2022 4.0  11/23/2014 3.7   Phosphorus (mg/dL)  Date Value  08/03/2022 3.0   Sodium (mmol/L)  Date Value  08/03/2022 137  11/03/2014 142   Assessment: Patient is an 81 y/o F with medical history including bilateral breast cancer s/p bilateral lumpectomy and radiation, HTN, HLD, cervical myelopathy / stenosis s/p C3-C4 ACDF on 07/31/22 who is admitted with post-operative neck hematoma s/p evacuation with drain placement. Pharmacy consulted to assist with electrolyte monitoring and replacement as indicated.  Goal of Therapy:  Electrolytes within normal limits  Plan:  --No electrolyte replacement indicated at this time --Follow-up electrolytes with AM labs tomorrow  Iyanla Eilers A 08/03/2022 10:32 AM

## 2022-08-04 DIAGNOSIS — I10 Essential (primary) hypertension: Secondary | ICD-10-CM

## 2022-08-04 DIAGNOSIS — G3184 Mild cognitive impairment, so stated: Secondary | ICD-10-CM

## 2022-08-04 DIAGNOSIS — S1093XA Contusion of unspecified part of neck, initial encounter: Secondary | ICD-10-CM | POA: Diagnosis not present

## 2022-08-04 LAB — CBC
HCT: 33.4 % — ABNORMAL LOW (ref 36.0–46.0)
Hemoglobin: 10.8 g/dL — ABNORMAL LOW (ref 12.0–15.0)
MCH: 28.1 pg (ref 26.0–34.0)
MCHC: 32.3 g/dL (ref 30.0–36.0)
MCV: 87 fL (ref 80.0–100.0)
Platelets: 388 10*3/uL (ref 150–400)
RBC: 3.84 MIL/uL — ABNORMAL LOW (ref 3.87–5.11)
RDW: 15.4 % (ref 11.5–15.5)
WBC: 12 10*3/uL — ABNORMAL HIGH (ref 4.0–10.5)
nRBC: 0 % (ref 0.0–0.2)

## 2022-08-04 LAB — BASIC METABOLIC PANEL
Anion gap: 4 — ABNORMAL LOW (ref 5–15)
BUN: 33 mg/dL — ABNORMAL HIGH (ref 8–23)
CO2: 31 mmol/L (ref 22–32)
Calcium: 8.8 mg/dL — ABNORMAL LOW (ref 8.9–10.3)
Chloride: 108 mmol/L (ref 98–111)
Creatinine, Ser: 0.65 mg/dL (ref 0.44–1.00)
GFR, Estimated: >60 mL/min (ref >60)
Glucose, Bld: 134 mg/dL — ABNORMAL HIGH (ref 70–99)
Potassium: 4.1 mmol/L (ref 3.5–5.1)
Sodium: 143 mmol/L (ref 135–145)

## 2022-08-04 LAB — MAGNESIUM: Magnesium: 2.5 mg/dL — ABNORMAL HIGH (ref 1.7–2.4)

## 2022-08-04 NOTE — Progress Notes (Signed)
IV discontinue discharge paperwork explain given and sign to patient and daughter, they state no questions at this time.

## 2022-08-04 NOTE — TOC Progression Note (Signed)
Spoke to [place] at [facility] To confirm that the patient is to have a rolling walker  She is discharging home and they tried to deliver to home but noone was there to accept it, They will deliver to the bedside within the hour

## 2022-08-04 NOTE — Plan of Care (Signed)
  Problem: Activity: Goal: Risk for activity intolerance will decrease Outcome: Progressing   Problem: Nutrition: Goal: Adequate nutrition will be maintained Outcome: Progressing   Problem: Coping: Goal: Level of anxiety will decrease Outcome: Progressing   

## 2022-08-04 NOTE — TOC Progression Note (Signed)
Transition of Care [facility]) - Progression Note    Patient Details  Name: Tina Johnston MRN: [id number] Date of Birth: 1941/04/16  Transition of Care [facility]) CM/SW [place], RN Phone Number: 08/04/2022, 11:37 AM  Clinical Narrative:     Reached out to [facility] with [facility] to confirm what disciplines HH is open with at this time, Awaiting a repsonse       Expected Discharge Plan and Services                                                 Social Determinants of Health (SDOH) Interventions    Readmission Risk Interventions     No data to display

## 2022-08-04 NOTE — Evaluation (Signed)
Physical Therapy Evaluation Patient Details Name: Tina Johnston MRN: [id number] DOB: Apr 03, 1941 Today's Date: 08/04/2022  History of Present Illness  Pt is an 81 y/o F admitted on 08/01/22 who presented to the ED with c/o neck swelling, difficulty swallowing, odynophagia & sensation of throat closing up. Pt found to have postoperative anterior cervical hematoma compressiong the airway prompting emergent intubation & hematoma evacuation with placement of JP drain. PMH: bilateral breast CA s/p bilateral lumpectomy & radiation, HTN, cervical myelopathy/stenosis s/p C3-4 ACDF 07/31/22  Clinical Impression  Pt seen for PT evaluation with pt agreeable to tx. Pt reports prior to initial surgery she was independent without AD, walking a couple of miles at the track. On this date, pt ambulated to gym & back with RW & supervision & negotiates 4 steps with 1 rail with supervision. Pt attempted gait without AD & initially requires CGA fade to supervision but demonstrates increased gait speed when ambulating without AD. Will continue to follow pt acutely to address high level balance, gait with LRAD, and stair negotiation.        Recommendations for follow up therapy are one component of a multi-disciplinary discharge planning process, led by the attending physician.  Recommendations may be updated based on patient status, additional functional criteria and insurance authorization.  Follow Up Recommendations Home health PT      Assistance Recommended at Discharge Intermittent Supervision/Assistance  Patient can return home with the following  A little help with walking and/or transfers;A little help with bathing/dressing/bathroom;Assistance with cooking/housework;Assist for transportation;Help with stairs or ramp for entrance    Equipment Recommendations None recommended by PT  Recommendations for Other Services  Speech consult    Functional Status Assessment Patient has had a recent decline in their  functional status and demonstrates the ability to make significant improvements in function in a reasonable and predictable amount of time.     Precautions / Restrictions Precautions Precautions: Fall;Cervical Restrictions Weight Bearing Restrictions: No      Mobility  Bed Mobility               General bed mobility comments: not tested, pt received up in room, left sitting EOB    Transfers Overall transfer level: Modified independent Equipment used: Rolling walker (2 wheels) Transfers: Sit to/from Stand Sit to Stand: Modified independent (Device/Increase time)                Ambulation/Gait Ambulation/Gait assistance: Min guard, Supervision Gait Distance (Feet): 150 Feet Assistive device: Rolling walker (2 wheels), None Gait Pattern/deviations: Decreased step length - right, Decreased step length - left, Decreased stride length       General Gait Details: Pt received ambulating in room with NT with pt using RW. Pt agreeable to continue ambulating & ambulates room<>gym with RW & distant supervision with slow, steady gait. Pt then ambulates without AD with CGA fade to supervision with increased gait speed without AD (10 ft + 40 ft).  Stairs Stairs: Yes Stairs assistance: Supervision, Min guard Stair Management: One rail Right, Step to pattern Number of Stairs: 4 General stair comments: educated pt on need to have someone transport RW up/down steps for her  Wheelchair Mobility    Modified Rankin (Stroke Patients Only)       Balance Overall balance assessment: Needs assistance Sitting-balance support: Single extremity supported Sitting balance-Leahy Scale: Good     Standing balance support: During functional activity, No upper extremity supported Standing balance-Leahy Scale: Fair  Pertinent Vitals/Pain Pain Assessment Pain Assessment: No/denies pain    Home Living Family/patient expects to be discharged  to:: Private residence Living Arrangements: Alone Available Help at Discharge: Available 24 hours/day;Family;Friend(s) Type of Home: House Home Access: Stairs to enter Entrance Stairs-Rails: Right;Left;Can reach both Entrance Stairs-Number of Steps: 4   Home Layout: One level (2 steps to access laundry)   Additional Comments: Pt reports she's supposed to have RW delivered to her home.    Prior Function               Mobility Comments: Pt reports prior to initial sx she was ambulatory without AD, walks a couple miles at the track ADLs Comments: Independent     Hand Dominance        Extremity/Trunk Assessment   Upper Extremity Assessment Upper Extremity Assessment: Overall WFL for tasks assessed    Lower Extremity Assessment Lower Extremity Assessment: Overall WFL for tasks assessed;Generalized weakness    Cervical / Trunk Assessment Cervical / Trunk Assessment:  (swelling & bruising noted to anterior neck)  Communication   Communication: No difficulties  Cognition Arousal/Alertness: Awake/alert Behavior During Therapy: WFL for tasks assessed/performed Overall Cognitive Status: Within Functional Limits for tasks assessed                                 General Comments: very sweet lady, follows commands, AxOx4        General Comments      Exercises     Assessment/Plan    PT Assessment Patient needs continued PT services  PT Problem List Decreased strength;Decreased range of motion;Decreased activity tolerance;Decreased balance;Decreased mobility;Decreased skin integrity       PT Treatment Interventions Gait training;Stair training;Therapeutic activities;Therapeutic exercise;Balance training;Neuromuscular re-education;Patient/family education;Functional mobility training    PT Goals (Current goals can be found in the Care Plan section)  Acute Rehab PT Goals Patient Stated Goal: return home PT Goal Formulation: With patient Time For Goal  Achievement: 08/18/22 Potential to Achieve Goals: Good    Frequency 7X/week     Co-evaluation               AM-PAC PT "6 Clicks" Mobility  Outcome Measure Help needed turning from your back to your side while in a flat bed without using bedrails?: None Help needed moving from lying on your back to sitting on the side of a flat bed without using bedrails?: None Help needed moving to and from a bed to a chair (including a wheelchair)?: None Help needed standing up from a chair using your arms (e.g., wheelchair or bedside chair)?: None Help needed to walk in hospital room?: A Little Help needed climbing 3-5 steps with a railing? : A Little 6 Click Score: 22    End of Session   Activity Tolerance: Patient tolerated treatment well Patient left: with call bell/phone within reach;with family/visitor present (sitting EOB) Nurse Communication: Mobility status PT Visit Diagnosis: Muscle weakness (generalized) (M62.81);Unsteadiness on feet (R26.81)    Time: 2426-8341 PT Time Calculation (min) (ACUTE ONLY): 18 min   Charges:   PT Evaluation $PT Eval Low Complexity: [place], PT, DPT 08/04/22, 11:06 AM   Waunita Schooner 08/04/2022, 11:02 AM

## 2022-08-04 NOTE — Progress Notes (Signed)
    Attending Progress Note  History: Tina Johnston is s/p C3-4 ACDF with readmission for neck swelling and cervical hematoma. She was taken back to the OR on the morning of 08/02/22 for evacuation.   POD2: Doing well.  Expected difficulty swallowing.  POD1: Doing well this morning.  POD0: Pt remains intubated but off sedation this morning.   Physical Exam: Vitals:   08/04/22 0456 08/04/22 0759  BP: (!) 175/94 (!) 149/77  Pulse: 85 78  Resp: 20 18  Temp: 97.8 F (36.6 C) 98.4 F (36.9 C)  SpO2: 100% 98%    Alert  CNI Follows commands x 4.  Trachea is midline  Swallowing with mild difficulty.  Worse with liquids than solids.  Data:  Recent Labs  Lab 08/02/22 0649 08/03/22 0339 08/04/22 0347  NA 138 137 143  K 3.6 4.1 4.1  CL 107 104 108  CO2 '25 26 31  '$ BUN 15 27* 33*  CREATININE 0.80 0.75 0.65  GLUCOSE 181* 150* 134*  CALCIUM 9.1 9.4 8.8*   Recent Labs  Lab 08/01/22 2132  AST 27  ALT 21  ALKPHOS 73     Recent Labs  Lab 08/02/22 0649 08/03/22 0339 08/04/22 0347  WBC 11.6* 11.3* 12.0*  HGB 11.7* 11.1* 10.8*  HCT 36.2 34.8* 33.4*  PLT 387 391 388   Recent Labs  Lab 08/02/22 0341  INR 1.0         Other tests/results:  08/01/22 CT neck  IMPRESSION: 1. Postoperative changes from recent ACDF at C3-4. Extensive swelling and edema throughout the adjacent prevertebral soft tissues, with extension along the left anterior cervical approach. Intermediate to high density complex fluid throughout this region, consistent with blood products/hematoma. No visible active contrast extravasation. Secondary mass effect on the supraglottic airway which is displaced anteriorly and slightly deviated to the right. 2. 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 An abnormally enlarged lymph node would be the primary differential consideration. Further evaluation with  dedicated thyroid ultrasound recommended. This could be performed on a nonemergent basis. 3. 7 mm heterogeneous right level 2 lymph node with possible internal necrosis, of uncertain etiology or significance. Attention at follow-up recommended. 4. Few small right upper lobe pulmonary nodules as above, measuring up to 5 mm as above. Per Fleischner Society Guidelines, a non-contrast Chest CT at 12 months is optional. If performed and the nodule is stable at 12 months, no further follow-up is recommended. These guidelines do not apply to immunocompromised patients and patients with cancer. Follow up in patients with significant comorbidities as clinically warranted. For lung cancer screening, adhere to Lung-RADS guidelines. Reference: Radiology. 2017; 284(1):228-43.   Results discussed by telephone at the time of interpretation on 08/01/2022 at 11:20 pm with provider Tina Johnston.     Electronically Signed   By: Tina Johnston M.D.   On: 08/01/2022 23:42    Assessment/Plan:  Tina Johnston is a 81 y.o with cervical myelopathy s/p C3-4 ACDF on 07/31/22 presenting with neck swelling and found to have cervical hematoma and paravertebral swelling s/p evaluation on 08/01/22  - mobilize - pain control - PTOT - Has some dysphagia. I reviewed with the patient and daughter that most post-operative dysphagia after anterior cervical approaches improves in the post-operative period.    Tina Maw MD Department of Neurosurgery

## 2022-08-04 NOTE — Discharge Summary (Signed)
Physician Discharge Summary  Tina Johnston XLK:[id number] DOB: 1941/03/03 DOA: 08/01/2022  PCP: Marinda Elk, MD  Admit date: 08/01/2022 Discharge date: 08/04/2022  Admitted From: home  Disposition:  home w/ home health   Recommendations for Outpatient Follow-up:  Follow up with PCP in 1-2 weeks F/u w/ neuro surg, Dr. Izora Ribas, in 2 weeks   Home Health: yes Equipment/Devices:  Discharge Condition: stable  CODE STATUS: full  Diet recommendation: Heart Healthy/Dysphagia III diet  Brief/Interim Summary: HPI was taken from NP Ouma: 81 y.o with significant PMH of bilateral breast cancer s/p bilateral lumpectomy and radiation, HTN. HLD, Cervical myelopathy/Stenosis s/p C3-C4 ACDF 07/31/22 who presented to the ED with chief complaints of neck swelling, difficulty swallowing, odynophagia and sensation of throat closing up.   Patient was recently admitted to the hospital on 8/9 for symptomatic cervical stenosis and underwent  successful C3-C4 ACDF. She was discharged on 8/10 at 4pm and later that evening her daughter noticed swelling and purplish discoloration on her neck. Patient states she tried to drink soup but choked on it and had difficulty and pain with swallowing. She also described sensation of her throat closing up.   ED Course: In the emergency department, the temperature was 37.1C, the heart rate 84 beats/minute, the blood pressure 180/77 mm Hg, the respiratory rate 18 breaths/minute, and the oxygen saturation 99% on RA. A STAT CT of the neck was obtained which showed extensive swelling and hematoma along the left anterior cervical region with secondary mass effect on the supraglottic airway which is displaced and slightly deviated to the right. Finding were discussed with on call Neurosurgery Dr. Cari Caraway who recommended initiation of steroid and will take patient urgently to OR for washout of the hematoma with drain placement. PCCM consulted for admission and medical  management.  8/11:Admitted to ICU with postoperative neck hematoma s/p evacuation with drain placement  As per Dr. Billie Ruddy: Pt was found to have a postoperative anterior cervical hematoma compressing the airway prompting emergent intubation and hematoma evacuation with the placement of a JP drain.  Following this, she was admitted to the ICU for continued monitoring and weaning off the ventilator.     As per Dr. Jimmye Norman 08/04/22: Pt's JP drain was removed on 08/03/22. Pt was no longer requiring supplemental oxygen when I started seeing the pt. PT evaluated the pt and recommended home health. For more information, please see previous progress/consult notes.    Discharge Diagnoses:  Principal Problem:   Hematoma of neck  Postoperative anterior cervical hematoma: w/ tracheal deviation. S/p hematoma evacuation & drain placement. Drain removed 08/03/22. Hx of cervical stenosis s/p C3-4 ACDF on 07/31/22. Neuro surg following and recs apprec   Postoperative mechanical ventilation requirement: for airway protection. S/p extubation. Not currently requiring supplemental oxygen    HTN: continue on home dose of amlodipine, valsartan-HCTZ   Hx of b/l breast cancer: s/p b/l lumpectomy and radiation. Management per onco outpatient    Mild cognitive impairment: continue on home dose of aricept   Discharge Instructions  Discharge Instructions     Diet - low sodium heart healthy   Complete by: As directed    Dysphagia III diet/Softer diet until pt is able to swallow better   Discharge instructions   Complete by: As directed    F/u w/ PCP in 1-2 weeks. F/u w/ neuro surg, Dr. Izora Ribas, in 2 weeks   Increase activity slowly   Complete by: As directed    No wound care   Complete  by: As directed       Allergies as of 08/04/2022       Reactions   Ultram [tramadol] Itching        Medication List     TAKE these medications    acetaminophen 500 MG tablet Commonly known as: TYLENOL Take 500 mg  by mouth 2 (two) times daily.   amLODipine 5 MG tablet Commonly known as: NORVASC Take 7.5 tablets by mouth daily.   Calcium Carb-Cholecalciferol 600-800 MG-UNIT Tabs Take 1 tablet by mouth 2 (two) times daily.   donepezil 5 MG tablet Commonly known as: ARICEPT Take 5 mg by mouth at bedtime.   fluticasone 50 MCG/ACT nasal spray Commonly known as: FLONASE Place 1 spray into both nostrils daily.   methocarbamol 500 MG tablet Commonly known as: ROBAXIN Take 1 tablet (500 mg total) by mouth every 8 (eight) hours as needed for muscle spasms.   multivitamin with minerals tablet Take 1 tablet by mouth daily.   oxyCODONE 5 MG immediate release tablet Commonly known as: Oxy IR/ROXICODONE Take 1 tablet (5 mg total) by mouth every 4 (four) hours as needed for up to 5 days for severe pain ((score 4 to 6)).   potassium chloride 10 MEQ tablet Commonly known as: KLOR-CON Take 1 tablet (10 mEq total) by mouth daily. What changed: when to take this   senna 8.6 MG Tabs tablet Commonly known as: SENOKOT Take 1 tablet (8.6 mg total) by mouth daily as needed for mild constipation.   simvastatin 20 MG tablet Commonly known as: ZOCOR Take 1 tablet by mouth at bedtime.   timolol 0.25 % ophthalmic solution Commonly known as: BETIMOL Place 1-2 drops into both eyes daily.   valsartan-hydrochlorothiazide 160-12.5 MG tablet Commonly known as: DIOVAN-HCT Take 1 tablet by mouth daily.   vitamin C 100 MG tablet Take 100 mg by mouth daily.        Allergies  Allergen Reactions   Ultram [Tramadol] Itching    Consultations: Neuro surg    Procedures/Studies: DG Chest 1 View  Result Date: 08/02/2022 CLINICAL DATA:  [id number].  Recent surgery.  Ventilator dependent. EXAM: CHEST  1 VIEW COMPARISON:  None Available. FINDINGS: Heart size and vasculature are normal. There is aortic atherosclerosis and slight tortuosity. No vascular congestion is seen. The lungs are clear. The sulci are sharp.  ETT is in place with tip 4.8 cm from the carina. Thoracic cage is intact. Right paratracheal soft tissue mass T2-4 level is noted and was seen on yesterday's soft tissue neck CT, displaces the trachea to the left slightly. IMPRESSION: No acute radiographic chest findings. ETT tip 4.8 cm from the carina. Again noted right paratracheal soft tissue mass. Electronically Signed   By: Telford Nab M.D.   On: 08/02/2022 03:27   CT Soft Tissue Neck W Contrast  Result Date: 08/01/2022 CLINICAL DATA:  Initial evaluation for neck swelling, recent ACDF. EXAM: CT NECK WITH CONTRAST TECHNIQUE: Multidetector CT imaging of the neck was performed using the standard protocol following the bolus administration of intravenous contrast. RADIATION DOSE REDUCTION: This exam was performed according to the departmental dose-optimization program which includes automated exposure control, adjustment of the mA and/or kV according to patient size and/or use of iterative reconstruction technique. CONTRAST:  33m OMNIPAQUE IOHEXOL 300 MG/ML  SOLN COMPARISON:  None available. FINDINGS: Pharynx and larynx: Oral cavity within normal limits. Nasopharynx normal. Oropharynx itself within normal limits. Postoperative changes from recent ACDF at C3-4. Hardware appears well positioned. Extensive swelling  and edema seen throughout the adjacent prevertebral soft tissues, extending anteriorly along the left anterolateral neck along the surgical approach. Intermediate to high density complex fluid seen throughout this region, consistent with blood products/hematoma. No visible active contrast extravasation. Superimposed scattered foci of postoperative soft tissue emphysema noted. Secondary mass effect on the supraglottic airway which is displaced anteriorly and slightly deviated to the right. Supraglottic airway narrowed measuring 5-6 mm in AP diameter at its most narrow point. Glottis itself within normal limits. Subglottic airway patent clear.  Salivary glands: Parotid and submandibular glands are within normal limits. Thyroid: 4.6 x 2.8 x 4.5 cm well-circumscribed ovoid heterogeneous mass within the right lower neck, immediately adjacent to the right lobe of thyroid (series 2, image 83). 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 Additional subcentimeter nodule noted within the right lobe of thyroid as well. Lymph nodes: 7 mm right level II a node demonstrating a heterogeneous appearance noted (series 2, image 42). Internal hypodensity could reflect necrosis. No other enlarged or pathologic adenopathy seen within the neck. Vascular: Normal intravascular enhancement seen throughout the neck. Scattered atheromatous change about the aortic arch, carotid bifurcations, and carotid siphons. Limited intracranial: Unremarkable. Visualized orbits: Unremarkable. Mastoids and visualized paranasal sinuses: Paranasal sinuses are largely clear. Visualized mastoids and middle ear cavities are well pneumatized and free of fluid. Skeleton: Sequelae of recent ACDF at C3-4. No visible hardware complication. Underlying moderate multilevel cervical spondylosis. No discrete or worrisome osseous lesions. Patient is edentulous. Upper chest: 5 mm right upper lobe pulmonary nodule (series 4, image 100). An adjacent 3 mm right upper lobe nodule noted as well (series 4, image 99). Visualized upper chest demonstrates no other acute finding. Other: None. IMPRESSION: 1. Postoperative changes from recent ACDF at C3-4. Extensive swelling and edema throughout the adjacent prevertebral soft tissues, with extension along the left anterior cervical approach. Intermediate to high density complex fluid throughout this region, consistent with blood products/hematoma. No visible active contrast extravasation. Secondary mass effect on the supraglottic airway which is displaced anteriorly and slightly deviated to the right. 2. 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 An abnormally enlarged lymph node would be the primary differential consideration. Further evaluation with dedicated thyroid ultrasound recommended. This could be performed on a nonemergent basis. 3. 7 mm heterogeneous right level 2 lymph node with possible internal necrosis, of uncertain etiology or significance. Attention at follow-up recommended. 4. Few small right upper lobe pulmonary nodules as above, measuring up to 5 mm as above. Per Fleischner Society Guidelines, a non-contrast Chest CT at 12 months is optional. If performed and the nodule is stable at 12 months, no further follow-up is recommended. These guidelines do not apply to immunocompromised patients and patients with cancer. Follow up in patients with significant comorbidities as clinically warranted. For lung cancer screening, adhere to Lung-RADS guidelines. Reference: Radiology. 2017; 284(1):228-43. Results discussed by telephone at the time of interpretation on 08/01/2022 at 11:20 pm with provider PATRICK ROBINSON. Electronically Signed   By: Jeannine Boga M.D.   On: 08/01/2022 23:42   DG Cervical Spine 2-3 Views  Result Date: 07/31/2022 CLINICAL DATA:  C3-4 anterior fixation EXAM: CERVICAL SPINE - 2-3 VIEW COMPARISON:  MRI of 05/25/2022 FINDINGS: Initial localization of the C3-4 level. Placement of a plate and screw fixation device at C3-4 with interbody fusion material. Endotracheal tube terminates just below the level of the clavicles. IMPRESSION: Intraoperative imaging  of anterior fixation at C3-4. Electronically Signed   By: Abigail Miyamoto M.D.   On: 07/31/2022 12:01   DG C-Arm 1-60 Min-No Report  Result Date: 07/31/2022 Fluoroscopy was utilized by the requesting physician.  No radiographic interpretation.   (Echo, Carotid, EGD, Colonoscopy, ERCP)    Subjective: Pt c/o fatigue    Discharge Exam: Vitals:   08/04/22 0456 08/04/22 0759  BP:  (!) 175/94 (!) 149/77  Pulse: 85 78  Resp: 20 18  Temp: 97.8 F (36.6 C) 98.4 F (36.9 C)  SpO2: 100% 98%   Vitals:   08/03/22 2025 08/04/22 0456 08/04/22 0500 08/04/22 0759  BP: (!) 156/74 (!) 175/94  (!) 149/77  Pulse: 79 85  78  Resp: '20 20  18  '$ Temp: 98.6 F (37 C) 97.8 F (36.6 C)  98.4 F (36.9 C)  TempSrc:      SpO2: 98% 100%  98%  Weight:   67.8 kg   Height:        General: Pt is alert, awake, not in acute distress Cardiovascular: S1/S2 +, no rubs, no gallops Respiratory: CTA bilaterally, no wheezing, no rhonchi Abdominal: Soft, NT, ND, bowel sounds + Extremities: no edema, no cyanosis    The results of significant diagnostics from this hospitalization (including imaging, microbiology, ancillary and laboratory) are listed below for reference.     Microbiology: No results found for this or any previous visit (from the past 240 hour(s)).   Labs: BNP (last 3 results) No results for input(s): "BNP" in the last 8760 hours. Basic Metabolic Panel: Recent Labs  Lab 08/01/22 2132 08/02/22 0649 08/03/22 0339 08/04/22 0347  NA 138 138 137 143  K 3.4* 3.6 4.1 4.1  CL 105 107 104 108  CO2 '26 25 26 31  '$ GLUCOSE 147* 181* 150* 134*  BUN 15 15 27* 33*  CREATININE 0.82 0.80 0.75 0.65  CALCIUM 9.9 9.1 9.4 8.8*  MG  --  2.1 2.4 2.5*  PHOS  --   --  3.0  --    Liver Function Tests: Recent Labs  Lab 08/01/22 2132  AST 27  ALT 21  ALKPHOS 73  BILITOT 0.5  PROT 7.6  ALBUMIN 4.0   No results for input(s): "LIPASE", "AMYLASE" in the last 168 hours. No results for input(s): "AMMONIA" in the last 168 hours. CBC: Recent Labs  Lab 08/01/22 2132 08/02/22 0649 08/03/22 0339 08/04/22 0347  WBC 15.2* 11.6* 11.3* 12.0*  NEUTROABS 12.0*  --   --   --   HGB 12.3 11.7* 11.1* 10.8*  HCT 38.3 36.2 34.8* 33.4*  MCV 86.5 85.6 86.6 87.0  PLT 406* 387 391 388   Cardiac Enzymes: No results for input(s): "CKTOTAL", "CKMB", "CKMBINDEX", "TROPONINI" in the last 168  hours. BNP: Invalid input(s): "POCBNP" CBG: Recent Labs  Lab 08/02/22 0243  GLUCAP 166*   D-Dimer No results for input(s): "DDIMER" in the last 72 hours. Hgb A1c No results for input(s): "HGBA1C" in the last 72 hours. Lipid Profile No results for input(s): "CHOL", "HDL", "LDLCALC", "TRIG", "CHOLHDL", "LDLDIRECT" in the last 72 hours. Thyroid function studies No results for input(s): "TSH", "T4TOTAL", "T3FREE", "THYROIDAB" in the last 72 hours.  Invalid input(s): "FREET3" Anemia work up No results for input(s): "VITAMINB12", "FOLATE", "FERRITIN", "TIBC", "IRON", "RETICCTPCT" in the last 72 hours. Urinalysis    Component Value Date/Time   COLORURINE YELLOW (A) 07/19/2022 1452   APPEARANCEUR CLEAR (A) 07/19/2022 1452   APPEARANCEUR Clear 10/31/2014 1330   LABSPEC 1.014 07/19/2022  1452   LABSPEC 1.009 10/31/2014 1330   PHURINE 6.0 07/19/2022 1452   GLUCOSEU NEGATIVE 07/19/2022 1452   GLUCOSEU Negative 10/31/2014 1330   HGBUR NEGATIVE 07/19/2022 1452   BILIRUBINUR NEGATIVE 07/19/2022 1452   BILIRUBINUR Negative 10/31/2014 1330   KETONESUR NEGATIVE 07/19/2022 1452   PROTEINUR NEGATIVE 07/19/2022 1452   NITRITE NEGATIVE 07/19/2022 1452   LEUKOCYTESUR NEGATIVE 07/19/2022 1452   LEUKOCYTESUR Negative 10/31/2014 1330   Sepsis Labs Recent Labs  Lab 08/01/22 2132 08/02/22 0649 08/03/22 0339 08/04/22 0347  WBC 15.2* 11.6* 11.3* 12.0*   Microbiology No results found for this or any previous visit (from the past 240 hour(s)).   Time coordinating discharge: Over 30 minutes  SIGNED:   Wyvonnia Dusky, MD  [facility] 08/04/2022, 2:06 PM Pager   If 7PM-7AM, please contact night-coverage [email or web address]

## 2022-08-05 ENCOUNTER — Encounter: Payer: Self-pay | Admitting: Neurosurgery

## 2022-08-05 ENCOUNTER — Encounter: Payer: Self-pay | Admitting: Internal Medicine

## 2022-08-07 ENCOUNTER — Ambulatory Visit: Admit: 2022-08-07 | Payer: Medicare HMO | Admitting: Ophthalmology

## 2022-08-07 SURGERY — PHACOEMULSIFICATION, CATARACT, WITH IOL INSERTION
Anesthesia: Topical | Laterality: Left

## 2022-08-15 ENCOUNTER — Encounter: Payer: Self-pay | Admitting: Neurosurgery

## 2022-08-15 ENCOUNTER — Ambulatory Visit (INDEPENDENT_AMBULATORY_CARE_PROVIDER_SITE_OTHER): Payer: Medicare HMO | Admitting: Neurosurgery

## 2022-08-15 VITALS — BP 166/84 | HR 66 | Temp 98.3°F

## 2022-08-15 DIAGNOSIS — Z09 Encounter for follow-up examination after completed treatment for conditions other than malignant neoplasm: Secondary | ICD-10-CM

## 2022-08-15 DIAGNOSIS — M96840 Postprocedural hematoma of a musculoskeletal structure following a musculoskeletal system procedure: Secondary | ICD-10-CM

## 2022-08-15 DIAGNOSIS — M4802 Spinal stenosis, cervical region: Secondary | ICD-10-CM

## 2022-08-15 DIAGNOSIS — Z981 Arthrodesis status: Secondary | ICD-10-CM

## 2022-08-15 NOTE — Progress Notes (Signed)
   REFERRING PHYSICIAN:  Marinda Elk, [address],  [place] [id number]  DOS: C2-4 ACDF 07/31/22 and hematoma evacuation on 08/02/22.   HISTORY OF PRESENT ILLNESS: Tina Johnston is about 2 weeks status post ACDF and hematoma evacuation. Overall, she is doing very well postoperatively.  She reports improvement of her balance and her pain.  She does continue to have some interscapular pain as well as some trouble with swallowing particular things like large pills however she is tolerating a largely normal diet.  She denies any new or worsening neurologic symptoms.  She does endorse some itching of her incision as well as bruising but no drainage or swelling.  PHYSICAL EXAMINATION:  NEUROLOGICAL:  General: In no acute distress.   Awake, alert, oriented to person, place, and time.  Pupils equal round and reactive to light.    Strength: Side Biceps Triceps Deltoid Interossei Grip Wrist Ext. Wrist Flex.  R '5 5 5 5 5 5 5  '$ L '5 5 5 5 5 5 5   '$ Incision c/d/I and healing well.  There is extensive ecchymosis around her incision  Imaging:  08/01/22 CT soft tissue neck IMPRESSION: 1. Postoperative changes from recent ACDF at C3-4. Extensive swelling and edema throughout the adjacent prevertebral soft tissues, with extension along the left anterior cervical approach. Intermediate to high density complex fluid throughout this region, consistent with blood products/hematoma. No visible active contrast extravasation. Secondary mass effect on the supraglottic airway which is displaced anteriorly and slightly deviated to the right. 2. 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 An abnormally enlarged lymph node would be the primary differential consideration. Further evaluation with dedicated thyroid ultrasound recommended. This could be performed on a nonemergent  basis. 3. 7 mm heterogeneous right level 2 lymph node with possible internal necrosis, of uncertain etiology or significance. Attention at follow-up recommended. 4. Few small right upper lobe pulmonary nodules as above, measuring up to 5 mm as above. Per Fleischner Society Guidelines, a non-contrast Chest CT at 12 months is optional. If performed and the nodule is stable at 12 months, no further follow-up is recommended. These guidelines do not apply to immunocompromised patients and patients with cancer. Follow up in patients with significant comorbidities as clinically warranted. For lung cancer screening, adhere to Lung-RADS guidelines. Reference: Radiology. 2017; 284(1):228-43.   Results discussed by telephone at the time of interpretation on 08/01/2022 at 11:20 pm with provider PATRICK ROBINSON.     Electronically Signed   By: Jeannine Boga M.D.   On: 08/01/2022 23:42  Assessment / Plan: Tina Johnston is doing well after ACDF and subsequent hematoma evacuation.  She has been discharged from home physical therapy.  We discussed outpatient therapy however she feels that she can increase her activity slowly and does not feel that she needs this at this time.  We discussed activity escalation and I have advised the patient to lift up to 10 pounds until 6 weeks after surgery, then increase up to 25 pounds until 12 weeks after surgery.  After 12 weeks post-op, the patient advised to increase activity as tolerated. she will return to clinic in approximately 4 weeks to see Dr. Izora Ribas with cervical x-rays prior  Cooper Render PA-C Dept of Neurosurgery

## 2022-09-11 ENCOUNTER — Other Ambulatory Visit: Payer: Self-pay

## 2022-09-11 DIAGNOSIS — Z981 Arthrodesis status: Secondary | ICD-10-CM

## 2022-09-11 DIAGNOSIS — E041 Nontoxic single thyroid nodule: Secondary | ICD-10-CM | POA: Diagnosis not present

## 2022-09-12 ENCOUNTER — Ambulatory Visit (INDEPENDENT_AMBULATORY_CARE_PROVIDER_SITE_OTHER): Payer: Medicare HMO | Admitting: Neurosurgery

## 2022-09-12 ENCOUNTER — Encounter: Payer: Self-pay | Admitting: Neurosurgery

## 2022-09-12 ENCOUNTER — Ambulatory Visit
Admission: RE | Admit: 2022-09-12 | Discharge: 2022-09-12 | Disposition: A | Discharge status: Home / Self Care | Payer: Medicare HMO | Attending: Physician Assistant | Admitting: Physician Assistant

## 2022-09-12 ENCOUNTER — Ambulatory Visit
Admission: RE | Admit: 2022-09-12 | Discharge: 2022-09-12 | Disposition: A | Discharge status: Home / Self Care | Payer: Medicare HMO | Source: Ambulatory Visit | Attending: Neurosurgery | Admitting: Neurosurgery

## 2022-09-12 VITALS — BP 134/78 | Temp 98.3°F | Ht 63.0 in | Wt 149.0 lb

## 2022-09-12 DIAGNOSIS — M96840 Postprocedural hematoma of a musculoskeletal structure following a musculoskeletal system procedure: Secondary | ICD-10-CM

## 2022-09-12 DIAGNOSIS — Z981 Arthrodesis status: Secondary | ICD-10-CM | POA: Insufficient documentation

## 2022-09-12 DIAGNOSIS — M4802 Spinal stenosis, cervical region: Secondary | ICD-10-CM

## 2022-09-12 DIAGNOSIS — Z09 Encounter for follow-up examination after completed treatment for conditions other than malignant neoplasm: Secondary | ICD-10-CM

## 2022-09-12 DIAGNOSIS — M47812 Spondylosis without myelopathy or radiculopathy, cervical region: Secondary | ICD-10-CM | POA: Diagnosis not present

## 2022-09-12 NOTE — Progress Notes (Signed)
   REFERRING PHYSICIAN:  [place], [place], [address] [place],   [id number]  DOS: C3-4 ACDF 07/31/22 and hematoma evacuation on 08/02/22.   HISTORY OF PRESENT ILLNESS: Tina Johnston is status post ACDF and hematoma evacuation. Overall, she is doing very well postoperatively.  She is having some issues with saliva control, but it is getting better.  Overall, she is happy with how she is doing.     PHYSICAL EXAMINATION:  NEUROLOGICAL:  General: In no acute distress.   Awake, alert, oriented to person, place, and time.  Pupils equal round and reactive to light.    Strength: Side Biceps Triceps Deltoid Interossei Grip Wrist Ext. Wrist Flex.  R '5 5 5 5 5 5 5  '$ L '5 5 5 5 5 5 5   '$ Incision c/d/I and healing well.    Imaging:  08/01/22 CT soft tissue neck IMPRESSION: 1. Postoperative changes from recent ACDF at C3-4. Extensive swelling and edema throughout the adjacent prevertebral soft tissues, with extension along the left anterior cervical approach. Intermediate to high density complex fluid throughout this region, consistent with blood products/hematoma. No visible active contrast extravasation. Secondary mass effect on the supraglottic airway which is displaced anteriorly and slightly deviated to the right. 2. 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 An abnormally enlarged lymph node would be the primary differential consideration. Further evaluation with dedicated thyroid ultrasound recommended. This could be performed on a nonemergent basis. 3. 7 mm heterogeneous right level 2 lymph node with possible internal necrosis, of uncertain etiology or significance. Attention at follow-up recommended. 4. Few small right upper lobe pulmonary nodules as above, measuring up to 5 mm as above. Per Fleischner Society Guidelines, a non-contrast Chest CT at 12 months is  optional. If performed and the nodule is stable at 12 months, no further follow-up is recommended. These guidelines do not apply to immunocompromised patients and patients with cancer. Follow up in patients with significant comorbidities as clinically warranted. For lung cancer screening, adhere to Lung-RADS guidelines. Reference: Radiology. 2017; 284(1):228-43.   Results discussed by telephone at the time of interpretation on 08/01/2022 at 11:20 pm with provider PATRICK ROBINSON.     Electronically Signed   By: Jeannine Boga M.D.   On: 08/01/2022 23:42  Assessment / Plan: Tina Johnston is doing well after ACDF and subsequent hematoma evacuation.  I think she is doing extremely well.  We discussed that her swallowing will likely improve over the next few weeks.  I have given her exercises for her neck.  I reviewed her activity limitations.  I will see her back in approximately 6 weeks.    Meade Maw MD Dept of Neurosurgery

## 2022-09-13 ENCOUNTER — Encounter: Payer: Self-pay | Admitting: Internal Medicine

## 2022-09-13 ENCOUNTER — Telehealth: Payer: Self-pay

## 2022-09-13 NOTE — Telephone Encounter (Signed)
-----   Message from Peggyann Shoals sent at 09/13/2022 10:41 AM EDT ----- Regarding: question Contact: [phone]  C3-4 ACDF on 07/31/22/hematoma evacuation on 08/02/22 Can she proceed with having cataract surgery? Do you think she is healed enough from surgery with you?

## 2022-09-16 NOTE — Telephone Encounter (Signed)
Patient notified

## 2022-09-26 DIAGNOSIS — Z780 Asymptomatic menopausal state: Secondary | ICD-10-CM | POA: Diagnosis not present

## 2022-09-26 DIAGNOSIS — R7303 Prediabetes: Secondary | ICD-10-CM | POA: Diagnosis not present

## 2022-09-26 DIAGNOSIS — G959 Disease of spinal cord, unspecified: Secondary | ICD-10-CM | POA: Diagnosis not present

## 2022-09-26 DIAGNOSIS — I10 Essential (primary) hypertension: Secondary | ICD-10-CM | POA: Diagnosis not present

## 2022-09-26 DIAGNOSIS — E782 Mixed hyperlipidemia: Secondary | ICD-10-CM | POA: Diagnosis not present

## 2022-09-26 DIAGNOSIS — E041 Nontoxic single thyroid nodule: Secondary | ICD-10-CM | POA: Diagnosis not present

## 2022-09-26 DIAGNOSIS — M159 Polyosteoarthritis, unspecified: Secondary | ICD-10-CM | POA: Diagnosis not present

## 2022-09-26 DIAGNOSIS — Z23 Encounter for immunization: Secondary | ICD-10-CM | POA: Diagnosis not present

## 2022-09-26 DIAGNOSIS — H2513 Age-related nuclear cataract, bilateral: Secondary | ICD-10-CM | POA: Diagnosis not present

## 2022-09-26 DIAGNOSIS — Z0000 Encounter for general adult medical examination without abnormal findings: Secondary | ICD-10-CM | POA: Diagnosis not present

## 2022-10-01 DIAGNOSIS — E042 Nontoxic multinodular goiter: Secondary | ICD-10-CM | POA: Diagnosis not present

## 2022-10-04 DIAGNOSIS — B351 Tinea unguium: Secondary | ICD-10-CM | POA: Diagnosis not present

## 2022-10-04 DIAGNOSIS — M2041 Other hammer toe(s) (acquired), right foot: Secondary | ICD-10-CM | POA: Diagnosis not present

## 2022-10-04 DIAGNOSIS — L600 Ingrowing nail: Secondary | ICD-10-CM | POA: Diagnosis not present

## 2022-10-04 DIAGNOSIS — M79674 Pain in right toe(s): Secondary | ICD-10-CM | POA: Diagnosis not present

## 2022-10-04 DIAGNOSIS — R7303 Prediabetes: Secondary | ICD-10-CM | POA: Diagnosis not present

## 2022-10-04 DIAGNOSIS — M2042 Other hammer toe(s) (acquired), left foot: Secondary | ICD-10-CM | POA: Diagnosis not present

## 2022-10-04 DIAGNOSIS — M79675 Pain in left toe(s): Secondary | ICD-10-CM | POA: Diagnosis not present

## 2022-10-04 DIAGNOSIS — M898X9 Other specified disorders of bone, unspecified site: Secondary | ICD-10-CM | POA: Diagnosis not present

## 2022-10-23 ENCOUNTER — Other Ambulatory Visit: Payer: Self-pay

## 2022-10-23 ENCOUNTER — Encounter: Payer: Self-pay | Admitting: Ophthalmology

## 2022-10-23 DIAGNOSIS — Z981 Arthrodesis status: Secondary | ICD-10-CM

## 2022-10-24 ENCOUNTER — Encounter: Payer: Self-pay | Admitting: Internal Medicine

## 2022-10-24 ENCOUNTER — Ambulatory Visit (INDEPENDENT_AMBULATORY_CARE_PROVIDER_SITE_OTHER): Payer: Medicare HMO | Admitting: Neurosurgery

## 2022-10-24 ENCOUNTER — Ambulatory Visit
Admission: RE | Admit: 2022-10-24 | Discharge: 2022-10-24 | Disposition: A | Discharge status: Home / Self Care | Payer: Medicare HMO | Attending: Neurosurgery | Admitting: Neurosurgery

## 2022-10-24 ENCOUNTER — Ambulatory Visit
Admission: RE | Admit: 2022-10-24 | Discharge: 2022-10-24 | Disposition: A | Discharge status: Home / Self Care | Payer: Medicare HMO | Source: Ambulatory Visit | Attending: Neurosurgery | Admitting: Neurosurgery

## 2022-10-24 ENCOUNTER — Encounter: Payer: Self-pay | Admitting: Neurosurgery

## 2022-10-24 VITALS — BP 130/72 | Temp 98.1°F | Ht 63.0 in | Wt 141.0 lb

## 2022-10-24 DIAGNOSIS — Z981 Arthrodesis status: Secondary | ICD-10-CM

## 2022-10-24 DIAGNOSIS — G959 Disease of spinal cord, unspecified: Secondary | ICD-10-CM

## 2022-10-24 DIAGNOSIS — M47812 Spondylosis without myelopathy or radiculopathy, cervical region: Secondary | ICD-10-CM | POA: Diagnosis not present

## 2022-10-24 DIAGNOSIS — Z09 Encounter for follow-up examination after completed treatment for conditions other than malignant neoplasm: Secondary | ICD-10-CM

## 2022-10-24 DIAGNOSIS — M96841 Postprocedural hematoma of a musculoskeletal structure following other procedure: Secondary | ICD-10-CM

## 2022-10-24 NOTE — Progress Notes (Signed)
   REFERRING PHYSICIAN:  Marinda Elk, [place] [place],  [place] [address]  DOS: C3-4 ACDF 07/31/22 and hematoma evacuation on 08/02/22.   HISTORY OF PRESENT ILLNESS: Tina Johnston is status post ACDF and hematoma evacuation.   She still having some swallowing difficulty but it is improving.  She is able to eat solids and liquids without trouble.  She is having her cataracts done starting next week.  She was recommended to have her thyroid biopsied but is apprehensive.    PHYSICAL EXAMINATION:  NEUROLOGICAL:  General: In no acute distress.   Awake, alert, oriented to person, place, and time.  Pupils equal round and reactive to light.    Strength: Side Biceps Triceps Deltoid Interossei Grip Wrist Ext. Wrist Flex.  R '5 5 5 5 5 5 5  '$ L '5 5 5 5 5 5 5   '$ Incision c/d/I and healing well.    Imaging:  08/01/22 CT soft tissue neck IMPRESSION: 1. Postoperative changes from recent ACDF at C3-4. Extensive swelling and edema throughout the adjacent prevertebral soft tissues, with extension along the left anterior cervical approach. Intermediate to high density complex fluid throughout this region, consistent with blood products/hematoma. No visible active contrast extravasation. Secondary mass effect on the supraglottic airway which is displaced anteriorly and slightly deviated to the right. 2. 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 An abnormally enlarged lymph node would be the primary differential consideration. Further evaluation with dedicated thyroid ultrasound recommended. This could be performed on a nonemergent basis. 3. 7 mm heterogeneous right level 2 lymph node with possible internal necrosis, of uncertain etiology or significance. Attention at follow-up recommended. 4. Few small right upper lobe pulmonary nodules as above,  measuring up to 5 mm as above. Per Fleischner Society Guidelines, a non-contrast Chest CT at 12 months is optional. If performed and the nodule is stable at 12 months, no further follow-up is recommended. These guidelines do not apply to immunocompromised patients and patients with cancer. Follow up in patients with significant comorbidities as clinically warranted. For lung cancer screening, adhere to Lung-RADS guidelines. Reference: Radiology. 2017; 284(1):228-43.   Results discussed by telephone at the time of interpretation on 08/01/2022 at 11:20 pm with provider PATRICK ROBINSON.     Electronically Signed   By: Jeannine Boga M.D.   On: 08/01/2022 23:42  Assessment / Plan: Tina Johnston is doing well after ACDF and subsequent hematoma evacuation.  I think she is doing extremely well.  I will see her back in 6 months.  She is off all activity limitations.  I encouraged her to get her thyroid evaluated.   Meade Maw MD Dept of Neurosurgery

## 2022-10-25 ENCOUNTER — Encounter: Payer: Self-pay | Admitting: Internal Medicine

## 2022-10-29 NOTE — Discharge Instructions (Signed)

## 2022-10-30 ENCOUNTER — Ambulatory Visit: Payer: Medicare HMO | Admitting: Anesthesiology

## 2022-10-30 ENCOUNTER — Encounter
Admission: RE | Disposition: A | Discharge status: Home / Self Care | Payer: Self-pay | Source: Home / Self Care | Attending: Ophthalmology

## 2022-10-30 ENCOUNTER — Other Ambulatory Visit: Payer: Self-pay

## 2022-10-30 ENCOUNTER — Ambulatory Visit
Admission: RE | Admit: 2022-10-30 | Discharge: 2022-10-30 | Disposition: A | Discharge status: Home / Self Care | Payer: Medicare HMO | Attending: Ophthalmology | Admitting: Ophthalmology

## 2022-10-30 DIAGNOSIS — I10 Essential (primary) hypertension: Secondary | ICD-10-CM | POA: Insufficient documentation

## 2022-10-30 DIAGNOSIS — M1990 Unspecified osteoarthritis, unspecified site: Secondary | ICD-10-CM | POA: Insufficient documentation

## 2022-10-30 DIAGNOSIS — Z79899 Other long term (current) drug therapy: Secondary | ICD-10-CM | POA: Insufficient documentation

## 2022-10-30 DIAGNOSIS — E785 Hyperlipidemia, unspecified: Secondary | ICD-10-CM | POA: Diagnosis not present

## 2022-10-30 DIAGNOSIS — H2511 Age-related nuclear cataract, right eye: Secondary | ICD-10-CM | POA: Insufficient documentation

## 2022-10-30 HISTORY — DX: Presence of dental prosthetic device (complete) (partial): Z97.2

## 2022-10-30 HISTORY — DX: Dizziness and giddiness: R42

## 2022-10-30 HISTORY — PX: CATARACT EXTRACTION W/PHACO: SHX586

## 2022-10-30 SURGERY — PHACOEMULSIFICATION, CATARACT, WITH IOL INSERTION
Anesthesia: Monitor Anesthesia Care | Site: Eye | Laterality: Right

## 2022-10-30 MED ORDER — CEFUROXIME OPHTHALMIC INJECTION 1 MG/0.1 ML
INJECTION | OPHTHALMIC | Status: DC | PRN
  Administered 2022-10-30: 1 mg via OPHTHALMIC

## 2022-10-30 MED ORDER — ARMC OPHTHALMIC DILATING DROPS
1.0000 | OPHTHALMIC | Status: AC | PRN
  Administered 2022-10-30 (×3): 1 via OPHTHALMIC

## 2022-10-30 MED ORDER — BRIMONIDINE TARTRATE-TIMOLOL 0.2-0.5 % OP SOLN
OPHTHALMIC | Status: DC | PRN
  Administered 2022-10-30: 1 [drp] via OPHTHALMIC

## 2022-10-30 MED ORDER — FENTANYL CITRATE (PF) 100 MCG/2ML IJ SOLN
INTRAMUSCULAR | Status: DC | PRN
  Administered 2022-10-30: 50 ug via INTRAVENOUS

## 2022-10-30 MED ORDER — TETRACAINE HCL 0.5 % OP SOLN
1.0000 [drp] | OPHTHALMIC | Status: AC | PRN
  Administered 2022-10-30 (×3): 1 [drp] via OPHTHALMIC

## 2022-10-30 MED ORDER — LACTATED RINGERS IV SOLN: INTRAVENOUS | Status: DC

## 2022-10-30 MED ORDER — SIGHTPATH DOSE#1 NA HYALUR & NA CHOND-NA HYALUR IO KIT
PACK | INTRAOCULAR | Status: DC | PRN
  Administered 2022-10-30: 1 via OPHTHALMIC

## 2022-10-30 MED ORDER — SIGHTPATH DOSE#1 BSS IO SOLN
INTRAOCULAR | Status: DC | PRN
  Administered 2022-10-30: 65 mL via OPHTHALMIC

## 2022-10-30 MED ORDER — SIGHTPATH DOSE#1 BSS IO SOLN
INTRAOCULAR | Status: DC | PRN
  Administered 2022-10-30: 15 mL

## 2022-10-30 MED ORDER — ONDANSETRON HCL 4 MG/2ML IJ SOLN: 4.0000 mg | Freq: Once | INTRAMUSCULAR | Status: DC | PRN

## 2022-10-30 MED ORDER — MIDAZOLAM HCL 2 MG/2ML IJ SOLN
INTRAMUSCULAR | Status: DC | PRN
  Administered 2022-10-30: 1 mg via INTRAVENOUS

## 2022-10-30 MED ORDER — SIGHTPATH DOSE#1 BSS IO SOLN: INTRAOCULAR | Status: DC | PRN

## 2022-10-30 SURGICAL SUPPLY — 10 items
CATARACT SUITE SIGHTPATH (MISCELLANEOUS) ×1 IMPLANT
FEE CATARACT SUITE SIGHTPATH (MISCELLANEOUS) ×1 IMPLANT
GLOVE SRG 8 PF TXTR STRL LF DI (GLOVE) ×1 IMPLANT
GLOVE SURG ENC TEXT LTX SZ7.5 (GLOVE) ×1 IMPLANT
GLOVE SURG UNDER POLY LF SZ8 (GLOVE) ×1
LENS IOL TECNIS EYHANCE 22.5 (Intraocular Lens) IMPLANT
NDL FILTER BLUNT 18X1 1/2 (NEEDLE) ×1 IMPLANT
NEEDLE FILTER BLUNT 18X1 1/2 (NEEDLE) ×1 IMPLANT
SYR 3ML LL SCALE MARK (SYRINGE) ×1 IMPLANT
WATER STERILE IRR 250ML POUR (IV SOLUTION) ×1 IMPLANT

## 2022-10-30 NOTE — H&P (Signed)
[facility]   Primary Care Physician:  Marinda Elk, MD Ophthalmologist: Dr. Leandrew Koyanagi  Pre-Procedure History & Physical: HPI:  Tina Johnston is a 81 y.o. female here for ophthalmic surgery.   Past Medical History:  Diagnosis Date   Arthritis    Breast cancer ([place]) 2014   Bilateral- radiation   Breast cyst    Family history of breast cancer    Family history of lung cancer    Family history of ovarian cancer    Family history of prostate cancer    Hyperlipidemia    Hypertension    Personal history of breast cancer    Personal history of radiation therapy 2014   Bilateral lumpectomy   Vertigo    1 episode several years ago   Wears dentures    full upper and lower    Past Surgical History:  Procedure Laterality Date   ABDOMINAL HYSTERECTOMY  1968   ANTERIOR CERVICAL DECOMP/DISCECTOMY FUSION N/A 08/02/2022   Procedure: ANTERIOR CERVICAL Wound exploration and hematoma evacuation;  Surgeon: Meade Maw, MD;  Location: [facility] ORS;  Service: Neurosurgery;  Laterality: N/A;   ANTERIOR CERVICAL DECOMP/DISCECTOMY FUSION N/A 07/31/2022   Procedure: C3-4 ANTERIOR CERVICAL DISCECTOMY AND FUSION (GLOBUS HEDRON);  Surgeon: Meade Maw, MD;  Location: [facility] ORS;  Service: Neurosurgery;  Laterality: N/A;   BREAST BIOPSY Bilateral 2014   Bilateral Bx Both Positive c Bilateral Lumpectomy   BREAST CYST ASPIRATION Right 2014   Positive   BREAST LUMPECTOMY Bilateral 2014   f/u radiation    BREAST SURGERY Bilateral July 15 2013   wide excision w sentinel node biopsy   SENTINEL LYMPH NODE BIOPSY Bilateral     Prior to Admission medications   Medication Sig Start Date End Date Taking? Authorizing Provider  acetaminophen (TYLENOL) 500 MG tablet Take 500 mg by mouth 2 (two) times daily.   Yes [provider]  amLODipine (NORVASC) 5 MG tablet Take 7.5 tablets by mouth daily.   Yes [provider]  Ascorbic Acid (VITAMIN C) 100 MG  tablet Take 100 mg by mouth daily.   Yes [provider]  Calcium Carb-Cholecalciferol 600-5 MG-MCG TABS Take by mouth daily.   Yes [provider]  donepezil (ARICEPT) 5 MG tablet Take 5 mg by mouth at bedtime.   Yes [provider]  fluticasone (FLONASE) 50 MCG/ACT nasal spray Place 1 spray into both nostrils daily.   Yes [provider]  gabapentin (NEURONTIN) 100 MG capsule Take 100 mg by mouth at bedtime.   Yes [provider]  Magnesium 250 MG TABS Take by mouth at bedtime.   Yes [provider]  meclizine (ANTIVERT) 12.5 MG tablet Take 12.5 mg by mouth 3 (three) times daily as needed for dizziness.   Yes [provider]  Multiple Vitamin (MULTIVITAMIN) tablet Take 1 tablet by mouth daily.   Yes [provider]  potassium chloride (K-DUR) 10 MEQ tablet Take 1 tablet (10 mEq total) by mouth daily. Patient taking differently: Take 10 mEq by mouth 2 (two) times daily. 08/30/16  Yes Triplett, Cari B, FNP  simvastatin (ZOCOR) 20 MG tablet Take 1 tablet by mouth at bedtime.   Yes [provider]  timolol (BETIMOL) 0.25 % ophthalmic solution Place 1-2 drops into both eyes daily.   Yes [provider]  valsartan-hydrochlorothiazide (DIOVAN-HCT) 160-12.5 MG tablet Take 1 tablet by mouth daily. 03/01/20 10/30/22 Yes [provider]  senna (SENOKOT) 8.6 MG TABS tablet Take 1 tablet (  8.6 mg total) by mouth daily as needed for mild constipation. 08/01/22   Loleta Dicker, PA    Allergies as of 09/30/2022 - Review Complete 09/12/2022  Allergen Reaction Noted   Ultram [tramadol] Itching 07/27/2013    Family History  Problem Relation Age of Onset   Breast cancer Mother        dx late 7s, d. 88   Lung cancer Father        d. 35   Breast cancer Sister        dx 55   Breast cancer Sister        dx 65   Prostate cancer Brother        d. 93   Ovarian cancer Paternal Grandmother        d. 64   Lung  cancer Paternal Grandfather        d. 28   Other Daughter        brain tumor, unk type    Social History   Socioeconomic History   Marital status: Divorced    Spouse name: Not on file   Number of children: Not on file   Years of education: Not on file   Highest education level: Not on file  Occupational History   Not on file  Tobacco Use   Smoking status: Never   Smokeless tobacco: Never  Vaping Use   Vaping Use: Never used  Substance and Sexual Activity   Alcohol use: No   Drug use: No   Sexual activity: Not on file  Other Topics Concern   Not on file  Social History Narrative   Not on file   Social Determinants of Health   Financial Resource Strain: Not on file  Food Insecurity: Not on file  Transportation Needs: Not on file  Physical Activity: Not on file  Stress: Not on file  Social Connections: Not on file  Intimate Partner Violence: Not on file    Review of Systems: See HPI, otherwise negative ROS  Physical Exam: BP (!) 179/85   Pulse 73   Temp 98.5 F (36.9 C) (Temporal)   Resp (!) 22   Ht '5\' 3"'$  (1.6 m)   Wt 64 kg   SpO2 99%   BMI 24.98 kg/m  General:   Alert,  pleasant and cooperative in NAD Head:  Normocephalic and atraumatic. Lungs:  Clear to auscultation.    Heart:  Regular rate and rhythm.   Impression/Plan: Tina Johnston is here for ophthalmic surgery.  Risks, benefits, limitations, and alternatives regarding ophthalmic surgery have been reviewed with the patient.  Questions have been answered.  All parties agreeable.   Leandrew Koyanagi, MD  10/30/2022, 11:43 AM

## 2022-10-30 NOTE — Anesthesia Preprocedure Evaluation (Signed)
Anesthesia Evaluation  Patient identified by MRN, date of birth, ID band Patient awake    Reviewed: Allergy & Precautions, NPO status , Patient's Chart, lab work & pertinent test results  History of Anesthesia Complications Negative for: history of anesthetic complications  Airway Mallampati: III  TM Distance: >3 FB Neck ROM: Full    Dental  (+) Upper Dentures, Lower Dentures   Pulmonary neg pulmonary ROS, neg sleep apnea, neg COPD, Patient abstained from smoking.Not current smoker   Pulmonary exam normal breath sounds clear to auscultation       Cardiovascular Exercise Tolerance: Good METShypertension, Pt. on medications (-) CAD and (-) Past MI (-) dysrhythmias  Rhythm:Regular Rate:Normal - Systolic murmurs    Neuro/Psych negative neurological ROS  negative psych ROS   GI/Hepatic ,neg GERD  ,,(+)     (-) substance abuse    Endo/Other  neg diabetes    Renal/GU negative Renal ROS     Musculoskeletal  (+) Arthritis , Osteoarthritis,    Abdominal   Peds  Hematology   Anesthesia Other Findings Past Medical History: No date: Arthritis 2014: Breast cancer ([place])     Comment:  Bilateral- radiation No date: Breast cyst No date: Family history of breast cancer No date: Family history of lung cancer No date: Family history of ovarian cancer No date: Family history of prostate cancer No date: Hyperlipidemia No date: Hypertension No date: Personal history of breast cancer 2014: Personal history of radiation therapy     Comment:  Bilateral lumpectomy  Reproductive/Obstetrics                              Anesthesia Physical Anesthesia Plan  ASA: 2  Anesthesia Plan: MAC   Post-op Pain Management:    Induction: Intravenous  PONV Risk Score and Plan: 1 and Midazolam and Treatment may vary due to age or medical condition  Airway Management Planned: Nasal Cannula  Additional  Equipment:   Intra-op Plan:   Post-operative Plan:   Informed Consent: I have reviewed the patients History and Physical, chart, labs and discussed the procedure including the risks, benefits and alternatives for the proposed anesthesia with the patient or authorized representative who has indicated his/her understanding and acceptance.       Plan Discussed with: CRNA and Surgeon  Anesthesia Plan Comments: (Explained risks of anesthesia, including PONV, and rare emergencies such as cardiac events, respiratory problems, and allergic reactions, requiring invasive intervention. Discussed the role of CRNA in patient's perioperative care. Patient understands. )         Anesthesia Quick Evaluation

## 2022-10-30 NOTE — Anesthesia Postprocedure Evaluation (Signed)
Anesthesia Post Note  Patient: SEPHIRA ZELLMAN  Procedure(s) Performed: CATARACT EXTRACTION PHACO AND INTRAOCULAR LENS PLACEMENT (IOC) RIGHT (Right: Eye)  Patient location during evaluation: PACU Anesthesia Type: MAC Level of consciousness: awake and alert Pain management: pain level controlled Vital Signs Assessment: post-procedure vital signs reviewed and stable Respiratory status: spontaneous breathing, nonlabored ventilation, respiratory function stable and patient connected to nasal cannula oxygen Cardiovascular status: stable and blood pressure returned to baseline Postop Assessment: no apparent nausea or vomiting Anesthetic complications: no   There were no known notable events for this encounter.   Last Vitals:  Vitals:   10/30/22 1300 10/30/22 1304  BP: (!) 165/80 (!) 146/83  Pulse: 78 75  Resp: (!) 9 14  Temp: (!) 36.2 C (!) 36.2 C  SpO2: 95% 97%    Last Pain:  Vitals:   10/30/22 1304  TempSrc:   PainSc: 0-No pain                 Arita Miss

## 2022-10-30 NOTE — Transfer of Care (Signed)
Immediate Anesthesia Transfer of Care Note  Patient: Tina Johnston  Procedure(s) Performed: CATARACT EXTRACTION PHACO AND INTRAOCULAR LENS PLACEMENT (IOC) RIGHT (Right: Eye)  Patient Location: PACU  Anesthesia Type: MAC  Level of Consciousness: awake, alert  and patient cooperative  Airway and Oxygen Therapy: Patient Spontanous Breathing and Patient connected to supplemental oxygen  Post-op Assessment: Post-op Vital signs reviewed, Patient's Cardiovascular Status Stable, Respiratory Function Stable, Patent Airway and No signs of Nausea or vomiting  Post-op Vital Signs: Reviewed and stable  Complications: There were no known notable events for this encounter.

## 2022-10-30 NOTE — Op Note (Signed)
  LOCATION:  [place]   PREOPERATIVE DIAGNOSIS:    Nuclear sclerotic cataract right eye. H25.11   POSTOPERATIVE DIAGNOSIS:  Nuclear sclerotic cataract right eye.     PROCEDURE:  Phacoemusification with posterior chamber intraocular lens placement of the right eye   ULTRASOUND TIME: Procedure(s) with comments: CATARACT EXTRACTION PHACO AND INTRAOCULAR LENS PLACEMENT (IOC) RIGHT (Right) - 7.73 01:11.7  LENS:   Implant Name Type Inv. Item Serial No. Manufacturer Lot No. LRB No. Used Action  LENS IOL TECNIS EYHANCE 22.5 - [id number] Intraocular Lens LENS IOL TECNIS EYHANCE 22.5 [id number] [employer]  Right 1 Implanted         SURGEON:  Wyonia Hough, MD   ANESTHESIA:  Topical with tetracaine drops and 2% Xylocaine jelly, augmented with 1% preservative-free intracameral lidocaine.    COMPLICATIONS:  None.   DESCRIPTION OF PROCEDURE:  The patient was identified in the holding room and transported to the operating room and placed in the supine position under the operating microscope.  The right eye was identified as the operative eye and it was prepped and draped in the usual sterile ophthalmic fashion.   A 1 millimeter clear-corneal paracentesis was made at the 12:00 position.  0.5 ml of preservative-free 1% lidocaine was injected into the anterior chamber. The anterior chamber was filled with Viscoat viscoelastic.  A 2.4 millimeter keratome was used to make a near-clear corneal incision at the 9:00 position.  A curvilinear capsulorrhexis was made with a cystotome and capsulorrhexis forceps.  Balanced salt solution was used to hydrodissect and hydrodelineate the nucleus.   Phacoemulsification was then used in stop and chop fashion to remove the lens nucleus and epinucleus.  The remaining cortex was then removed using the irrigation and aspiration handpiece. Provisc was then placed into the capsular bag to distend it for lens placement.  A lens was then injected into the  capsular bag.  The remaining viscoelastic was aspirated.   Wounds were hydrated with balanced salt solution.  The anterior chamber was inflated to a physiologic pressure with balanced salt solution.  No wound leaks were noted. Cefuroxime 0.1 ml of a '10mg'$ /ml solution was injected into the anterior chamber for a dose of 1 mg of intracameral antibiotic at the completion of the case.   Timolol and Brimonidine drops were applied to the eye.  The patient was taken to the recovery room in stable condition without complications of anesthesia or surgery.   Glada Wickstrom 10/30/2022, 12:57 PM

## 2022-10-31 ENCOUNTER — Encounter: Payer: Self-pay | Admitting: Ophthalmology

## 2022-11-07 ENCOUNTER — Encounter: Payer: Self-pay | Admitting: Ophthalmology

## 2022-11-07 ENCOUNTER — Encounter: Payer: Self-pay | Admitting: Internal Medicine

## 2022-11-12 NOTE — Discharge Instructions (Signed)

## 2022-11-13 ENCOUNTER — Encounter: Payer: Self-pay | Admitting: Ophthalmology

## 2022-11-13 ENCOUNTER — Ambulatory Visit
Admission: RE | Admit: 2022-11-13 | Discharge: 2022-11-13 | Disposition: A | Discharge status: Home / Self Care | Payer: Medicare HMO | Attending: Ophthalmology | Admitting: Ophthalmology

## 2022-11-13 ENCOUNTER — Other Ambulatory Visit: Payer: Self-pay

## 2022-11-13 ENCOUNTER — Ambulatory Visit: Payer: Medicare HMO | Admitting: Anesthesiology

## 2022-11-13 ENCOUNTER — Encounter
Admission: RE | Disposition: A | Discharge status: Home / Self Care | Payer: Self-pay | Source: Home / Self Care | Attending: Ophthalmology

## 2022-11-13 DIAGNOSIS — Z803 Family history of malignant neoplasm of breast: Secondary | ICD-10-CM | POA: Insufficient documentation

## 2022-11-13 DIAGNOSIS — Z923 Personal history of irradiation: Secondary | ICD-10-CM | POA: Diagnosis not present

## 2022-11-13 DIAGNOSIS — Z8042 Family history of malignant neoplasm of prostate: Secondary | ICD-10-CM | POA: Diagnosis not present

## 2022-11-13 DIAGNOSIS — Z8041 Family history of malignant neoplasm of ovary: Secondary | ICD-10-CM | POA: Insufficient documentation

## 2022-11-13 DIAGNOSIS — T7840XA Allergy, unspecified, initial encounter: Secondary | ICD-10-CM | POA: Diagnosis not present

## 2022-11-13 DIAGNOSIS — Z853 Personal history of malignant neoplasm of breast: Secondary | ICD-10-CM | POA: Insufficient documentation

## 2022-11-13 DIAGNOSIS — Z801 Family history of malignant neoplasm of trachea, bronchus and lung: Secondary | ICD-10-CM | POA: Insufficient documentation

## 2022-11-13 DIAGNOSIS — H2512 Age-related nuclear cataract, left eye: Secondary | ICD-10-CM | POA: Diagnosis not present

## 2022-11-13 DIAGNOSIS — E785 Hyperlipidemia, unspecified: Secondary | ICD-10-CM | POA: Diagnosis not present

## 2022-11-13 DIAGNOSIS — I10 Essential (primary) hypertension: Secondary | ICD-10-CM | POA: Diagnosis not present

## 2022-11-13 HISTORY — PX: CATARACT EXTRACTION W/PHACO: SHX586

## 2022-11-13 SURGERY — PHACOEMULSIFICATION, CATARACT, WITH IOL INSERTION
Anesthesia: Monitor Anesthesia Care | Site: Eye | Laterality: Left

## 2022-11-13 MED ORDER — SIGHTPATH DOSE#1 BSS IO SOLN
INTRAOCULAR | Status: DC | PRN
  Administered 2022-11-13: 62 mL via OPHTHALMIC

## 2022-11-13 MED ORDER — FENTANYL CITRATE (PF) 100 MCG/2ML IJ SOLN
INTRAMUSCULAR | Status: DC | PRN
  Administered 2022-11-13: 100 ug via INTRAVENOUS

## 2022-11-13 MED ORDER — BRIMONIDINE TARTRATE-TIMOLOL 0.2-0.5 % OP SOLN
OPHTHALMIC | Status: DC | PRN
  Administered 2022-11-13: 1 [drp] via OPHTHALMIC

## 2022-11-13 MED ORDER — ARMC OPHTHALMIC DILATING DROPS
1.0000 | OPHTHALMIC | Status: DC | PRN
  Administered 2022-11-13 (×3): 1 via OPHTHALMIC

## 2022-11-13 MED ORDER — CEFUROXIME OPHTHALMIC INJECTION 1 MG/0.1 ML
INJECTION | OPHTHALMIC | Status: DC | PRN
  Administered 2022-11-13: .1 mL via INTRACAMERAL

## 2022-11-13 MED ORDER — SIGHTPATH DOSE#1 BSS IO SOLN
INTRAOCULAR | Status: DC | PRN
  Administered 2022-11-13: 1 mL via INTRAMUSCULAR

## 2022-11-13 MED ORDER — LACTATED RINGERS IV SOLN: INTRAVENOUS | Status: DC

## 2022-11-13 MED ORDER — SIGHTPATH DOSE#1 NA HYALUR & NA CHOND-NA HYALUR IO KIT
PACK | INTRAOCULAR | Status: DC | PRN
  Administered 2022-11-13: 1 via OPHTHALMIC

## 2022-11-13 MED ORDER — TETRACAINE HCL 0.5 % OP SOLN
1.0000 [drp] | OPHTHALMIC | Status: DC | PRN
  Administered 2022-11-13 (×3): 1 [drp] via OPHTHALMIC

## 2022-11-13 MED ORDER — MIDAZOLAM HCL 2 MG/2ML IJ SOLN
INTRAMUSCULAR | Status: DC | PRN
  Administered 2022-11-13: 1 mg via INTRAVENOUS

## 2022-11-13 MED ORDER — SIGHTPATH DOSE#1 BSS IO SOLN
INTRAOCULAR | Status: DC | PRN
  Administered 2022-11-13: 15 mL

## 2022-11-13 SURGICAL SUPPLY — 10 items
CATARACT SUITE SIGHTPATH (MISCELLANEOUS) ×1 IMPLANT
FEE CATARACT SUITE SIGHTPATH (MISCELLANEOUS) ×1 IMPLANT
GLOVE SRG 8 PF TXTR STRL LF DI (GLOVE) ×1 IMPLANT
GLOVE SURG ENC TEXT LTX SZ7.5 (GLOVE) ×1 IMPLANT
GLOVE SURG UNDER POLY LF SZ8 (GLOVE) ×1
LENS IOL TECNIS EYHANCE 23.0 (Intraocular Lens) IMPLANT
NDL FILTER BLUNT 18X1 1/2 (NEEDLE) ×1 IMPLANT
NEEDLE FILTER BLUNT 18X1 1/2 (NEEDLE) ×1 IMPLANT
SYR 3ML LL SCALE MARK (SYRINGE) ×1 IMPLANT
WATER STERILE IRR 250ML POUR (IV SOLUTION) ×1 IMPLANT

## 2022-11-13 NOTE — Anesthesia Preprocedure Evaluation (Signed)
Anesthesia Evaluation  Patient identified by MRN, date of birth, ID band Patient awake    Reviewed: Allergy & Precautions, NPO status , Patient's Chart, lab work & pertinent test results  History of Anesthesia Complications Negative for: history of anesthetic complications  Airway Mallampati: III  TM Distance: >3 FB Neck ROM: Full    Dental  (+) Upper Dentures, Lower Dentures   Pulmonary neg pulmonary ROS, neg sleep apnea, neg COPD, Patient abstained from smoking.Not current smoker   Pulmonary exam normal breath sounds clear to auscultation       Cardiovascular Exercise Tolerance: Good METShypertension, Pt. on medications (-) CAD and (-) Past MI (-) dysrhythmias  Rhythm:Regular Rate:Normal - Systolic murmurs    Neuro/Psych negative neurological ROS  negative psych ROS   GI/Hepatic ,neg GERD  ,,(+)     (-) substance abuse    Endo/Other  neg diabetes    Renal/GU negative Renal ROS     Musculoskeletal  (+) Arthritis , Osteoarthritis,    Abdominal   Peds  Hematology   Anesthesia Other Findings Past Medical History: No date: Arthritis 2014: Breast cancer ([place])     Comment:  Bilateral- radiation No date: Breast cyst No date: Family history of breast cancer No date: Family history of lung cancer No date: Family history of ovarian cancer No date: Family history of prostate cancer No date: Hyperlipidemia No date: Hypertension No date: Personal history of breast cancer 2014: Personal history of radiation therapy     Comment:  Bilateral lumpectomy  Reproductive/Obstetrics                              Anesthesia Physical Anesthesia Plan  ASA: 2  Anesthesia Plan: MAC   Post-op Pain Management:    Induction: Intravenous  PONV Risk Score and Plan: 1 and Midazolam and Treatment may vary due to age or medical condition  Airway Management Planned: Nasal Cannula  Additional  Equipment:   Intra-op Plan:   Post-operative Plan:   Informed Consent: I have reviewed the patients History and Physical, chart, labs and discussed the procedure including the risks, benefits and alternatives for the proposed anesthesia with the patient or authorized representative who has indicated his/her understanding and acceptance.       Plan Discussed with: CRNA and Surgeon  Anesthesia Plan Comments: (Explained risks of anesthesia, including PONV, and rare emergencies such as cardiac events, respiratory problems, and allergic reactions, requiring invasive intervention. Discussed the role of CRNA in patient's perioperative care. Patient understands. )         Anesthesia Quick Evaluation

## 2022-11-13 NOTE — Op Note (Signed)
  OPERATIVE NOTE  Tina Johnston [id number] 11/13/2022   PREOPERATIVE DIAGNOSIS:  Nuclear sclerotic cataract left eye. H25.12   POSTOPERATIVE DIAGNOSIS:    Nuclear sclerotic cataract left eye.     PROCEDURE:  Phacoemusification with posterior chamber intraocular lens placement of the left eye  Ultrasound time: Procedure(s): CATARACT EXTRACTION PHACO AND INTRAOCULAR LENS PLACEMENT (IOC) LEFT  10.80  01:35.8 (Left)  LENS:  * No implants in log *   DIB00 23.0 D PCIOL  SURGEON:  Wyonia Hough, MD   ANESTHESIA:  Topical with tetracaine drops and 2% Xylocaine jelly, augmented with 1% preservative-free intracameral lidocaine.    COMPLICATIONS:  None.   DESCRIPTION OF PROCEDURE:  The patient was identified in the holding room and transported to the operating room and placed in the supine position under the operating microscope.  The left eye was identified as the operative eye and it was prepped and draped in the usual sterile ophthalmic fashion.   A 1 millimeter clear-corneal paracentesis was made at the 1:30 position.  0.5 ml of preservative-free 1% lidocaine was injected into the anterior chamber.  The anterior chamber was filled with Viscoat viscoelastic.  A 2.4 millimeter keratome was used to make a near-clear corneal incision at the 10:30 position.  .  A curvilinear capsulorrhexis was made with a cystotome and capsulorrhexis forceps.  Balanced salt solution was used to hydrodissect and hydrodelineate the nucleus.   Phacoemulsification was then used in stop and chop fashion to remove the lens nucleus and epinucleus.  The remaining cortex was then removed using the irrigation and aspiration handpiece. Provisc was then placed into the capsular bag to distend it for lens placement.  A lens was then injected into the capsular bag.  The remaining viscoelastic was aspirated.   Wounds were hydrated with balanced salt solution.  The anterior chamber was inflated to a physiologic  pressure with balanced salt solution.  No wound leaks were noted. Cefuroxime 0.1 ml of a '10mg'$ /ml solution was injected into the anterior chamber for a dose of 1 mg of intracameral antibiotic at the completion of the case.   Timolol and Brimonidine drops were applied to the eye.  The patient was taken to the recovery room in stable condition without complications of anesthesia or surgery.  [name] 11/13/2022, 12:01 PM

## 2022-11-13 NOTE — Anesthesia Postprocedure Evaluation (Signed)
Anesthesia Post Note  Patient: Tina Johnston  Procedure(s) Performed: CATARACT EXTRACTION PHACO AND INTRAOCULAR LENS PLACEMENT (IOC) LEFT  10.80  01:35.8 (Left: Eye)  Patient location during evaluation: PACU Anesthesia Type: MAC Level of consciousness: awake and alert Pain management: pain level controlled Vital Signs Assessment: post-procedure vital signs reviewed and stable Respiratory status: spontaneous breathing, nonlabored ventilation, respiratory function stable and patient connected to nasal cannula oxygen Cardiovascular status: stable and blood pressure returned to baseline Postop Assessment: no apparent nausea or vomiting Anesthetic complications: no   There were no known notable events for this encounter.   Last Vitals:  Vitals:   11/13/22 1202 11/13/22 1207  BP: (!) 153/86 (!) 147/80  Pulse: 65 73  Resp: 18 20  Temp: 36.6 C   SpO2: 100% 97%    Last Pain:  Vitals:   11/13/22 1207  PainSc: 0-No pain                 Martha Clan

## 2022-11-13 NOTE — H&P (Signed)
[facility]   Primary Care Physician:  Marinda Elk, MD Ophthalmologist: Dr. Leandrew Koyanagi  Pre-Procedure History & Physical: HPI:  Tina Johnston is a 81 y.o. female here for ophthalmic surgery.   Past Medical History:  Diagnosis Date   Arthritis    Breast cancer ([place]) 2014   Bilateral- radiation   Breast cyst    Family history of breast cancer    Family history of lung cancer    Family history of ovarian cancer    Family history of prostate cancer    Hyperlipidemia    Hypertension    Personal history of breast cancer    Personal history of radiation therapy 2014   Bilateral lumpectomy   Vertigo    1 episode several years ago   Wears dentures    full upper and lower    Past Surgical History:  Procedure Laterality Date   ABDOMINAL HYSTERECTOMY  1968   ANTERIOR CERVICAL DECOMP/DISCECTOMY FUSION N/A 08/02/2022   Procedure: ANTERIOR CERVICAL Wound exploration and hematoma evacuation;  Surgeon: Meade Maw, MD;  Location: [facility] ORS;  Service: Neurosurgery;  Laterality: N/A;   ANTERIOR CERVICAL DECOMP/DISCECTOMY FUSION N/A 07/31/2022   Procedure: C3-4 ANTERIOR CERVICAL DISCECTOMY AND FUSION (GLOBUS HEDRON);  Surgeon: Meade Maw, MD;  Location: [facility];  Service: Neurosurgery;  Laterality: N/A;   BREAST BIOPSY Bilateral 2014   Bilateral Bx Both Positive c Bilateral Lumpectomy   BREAST CYST ASPIRATION Right 2014   Positive   BREAST LUMPECTOMY Bilateral 2014   f/u radiation    BREAST SURGERY Bilateral July 15 2013   wide excision w sentinel node biopsy   CATARACT EXTRACTION W/PHACO Right 10/30/2022   Procedure: CATARACT EXTRACTION PHACO AND INTRAOCULAR LENS PLACEMENT ([place]) RIGHT;  Surgeon: Leandrew Koyanagi, MD;  Location: [place];  Service: Ophthalmology;  Laterality: Right;  7.73 01:11.7   SENTINEL LYMPH NODE BIOPSY Bilateral     Prior to Admission medications   Medication Sig Start Date End Date Taking? Authorizing  Provider  acetaminophen (TYLENOL) 500 MG tablet Take 500 mg by mouth 2 (two) times daily.   Yes [provider]  amLODipine (NORVASC) 5 MG tablet Take 7.5 tablets by mouth daily.   Yes [provider]  Ascorbic Acid (VITAMIN C) 100 MG tablet Take 100 mg by mouth daily.   Yes [provider]  Calcium Carb-Cholecalciferol 600-5 MG-MCG TABS Take by mouth daily.   Yes [provider]  donepezil (ARICEPT) 5 MG tablet Take 5 mg by mouth at bedtime.   Yes [provider]  fluticasone (FLONASE) 50 MCG/ACT nasal spray Place 1 spray into both nostrils daily.   Yes [provider]  gabapentin (NEURONTIN) 100 MG capsule Take 100 mg by mouth at bedtime.   Yes [provider]  Magnesium 250 MG TABS Take by mouth at bedtime.   Yes [provider]  meclizine (ANTIVERT) 12.5 MG tablet Take 12.5 mg by mouth 3 (three) times daily as needed for dizziness.   Yes [provider]  Multiple Vitamin (MULTIVITAMIN) tablet Take 1 tablet by mouth daily.   Yes [provider]  potassium chloride (K-DUR) 10 MEQ tablet Take 1 tablet (10 mEq total) by mouth daily. Patient taking differently: Take 10 mEq by mouth 2 (two) times daily. 08/30/16  Yes Triplett, Cari B, FNP  senna (SENOKOT) 8.6 MG TABS tablet Take 1 tablet (8.6 mg total) by mouth daily as needed for mild constipation. 08/01/22  Yes Loleta Dicker, PA  simvastatin (ZOCOR) 20 MG tablet Take 1 tablet by mouth at bedtime.   Yes [provider]  timolol (BETIMOL) 0.25 % ophthalmic solution Place 1-2 drops into both eyes daily.   Yes [provider]  valsartan-hydrochlorothiazide (DIOVAN-HCT) 160-12.5 MG tablet Take 1 tablet by mouth daily. 03/01/20 11/13/22 Yes [provider]    Allergies as of 09/30/2022 - Review Complete 09/12/2022  Allergen Reaction Noted   Ultram [tramadol] Itching 07/27/2013    Family History  Problem Relation Age of Onset    Breast cancer Mother        dx late 68s, d. 62   Lung cancer Father        d. 46   Breast cancer Sister        dx 23   Breast cancer Sister        dx 18   Prostate cancer Brother        d. 78   Ovarian cancer Paternal Grandmother        d. 15   Lung cancer Paternal Grandfather        d. 1   Other Daughter        brain tumor, unk type    Social History   Socioeconomic History   Marital status: Divorced    Spouse name: Not on file   Number of children: Not on file   Years of education: Not on file   Highest education level: Not on file  Occupational History   Not on file  Tobacco Use   Smoking status: Never   Smokeless tobacco: Never  Vaping Use   Vaping Use: Never used  Substance and Sexual Activity   Alcohol use: No   Drug use: No   Sexual activity: Not on file  Other Topics Concern   Not on file  Social History Narrative   Not on file   Social Determinants of Health   Financial Resource Strain: Not on file  Food Insecurity: Not on file  Transportation Needs: Not on file  Physical Activity: Not on file  Stress: Not on file  Social Connections: Not on file  Intimate Partner Violence: Not on file    Review of Systems: See HPI, otherwise negative ROS  Physical Exam: BP (!) 161/96   Pulse 67   Temp 97.6 F (36.4 C)   Resp 18   Ht '5\' 3"'$  (1.6 m)   Wt 64.9 kg   SpO2 100%   BMI 25.33 kg/m  General:   Alert,  pleasant and cooperative in NAD Head:  Normocephalic and atraumatic. Lungs:  Clear to auscultation.    Heart:  Regular rate and rhythm.   Impression/Plan: Tina Johnston is here for ophthalmic surgery.  Risks, benefits, limitations, and alternatives regarding ophthalmic surgery have been reviewed with the patient.  Questions have been answered.  All parties agreeable.   Leandrew Koyanagi, MD  11/13/2022, 11:23 AM

## 2022-11-13 NOTE — Transfer of Care (Signed)
Immediate Anesthesia Transfer of Care Note  Patient: Tina Johnston  Procedure(s) Performed: CATARACT EXTRACTION PHACO AND INTRAOCULAR LENS PLACEMENT (IOC) LEFT  10.80  01:35.8 (Left: Eye)  Patient Location: PACU  Anesthesia Type: MAC  Level of Consciousness: awake, alert  and patient cooperative  Airway and Oxygen Therapy: Patient Spontanous Breathing and Patient connected to supplemental oxygen  Post-op Assessment: Post-op Vital signs reviewed, Patient's Cardiovascular Status Stable, Respiratory Function Stable, Patent Airway and No signs of Nausea or vomiting  Post-op Vital Signs: Reviewed and stable  Complications: No notable events documented.

## 2022-11-27 ENCOUNTER — Inpatient Hospital Stay (HOSPITAL_BASED_OUTPATIENT_CLINIC_OR_DEPARTMENT_OTHER): Payer: Medicare HMO | Admitting: Internal Medicine

## 2022-11-27 ENCOUNTER — Inpatient Hospital Stay: Payer: Medicare HMO | Attending: Internal Medicine

## 2022-11-27 ENCOUNTER — Encounter: Payer: Self-pay | Admitting: Internal Medicine

## 2022-11-27 VITALS — BP 165/92 | HR 72 | Temp 98.1°F | Resp 18 | Wt 145.1 lb

## 2022-11-27 DIAGNOSIS — Z803 Family history of malignant neoplasm of breast: Secondary | ICD-10-CM | POA: Insufficient documentation

## 2022-11-27 DIAGNOSIS — Z853 Personal history of malignant neoplasm of breast: Secondary | ICD-10-CM | POA: Insufficient documentation

## 2022-11-27 DIAGNOSIS — Z801 Family history of malignant neoplasm of trachea, bronchus and lung: Secondary | ICD-10-CM | POA: Diagnosis not present

## 2022-11-27 DIAGNOSIS — C50411 Malignant neoplasm of upper-outer quadrant of right female breast: Secondary | ICD-10-CM

## 2022-11-27 DIAGNOSIS — Z170 Estrogen receptor positive status [ER+]: Secondary | ICD-10-CM | POA: Diagnosis not present

## 2022-11-27 DIAGNOSIS — Z8042 Family history of malignant neoplasm of prostate: Secondary | ICD-10-CM | POA: Diagnosis not present

## 2022-11-27 DIAGNOSIS — Z8041 Family history of malignant neoplasm of ovary: Secondary | ICD-10-CM | POA: Insufficient documentation

## 2022-11-27 DIAGNOSIS — R0789 Other chest pain: Secondary | ICD-10-CM | POA: Insufficient documentation

## 2022-11-27 DIAGNOSIS — M8580 Other specified disorders of bone density and structure, unspecified site: Secondary | ICD-10-CM | POA: Diagnosis not present

## 2022-11-27 DIAGNOSIS — Z780 Asymptomatic menopausal state: Secondary | ICD-10-CM | POA: Insufficient documentation

## 2022-11-27 DIAGNOSIS — Z923 Personal history of irradiation: Secondary | ICD-10-CM | POA: Diagnosis not present

## 2022-11-27 DIAGNOSIS — K769 Liver disease, unspecified: Secondary | ICD-10-CM | POA: Insufficient documentation

## 2022-11-27 LAB — CBC WITH DIFFERENTIAL/PLATELET
Abs Immature Granulocytes: 0.02 10*3/uL (ref 0.00–0.07)
Basophils Absolute: 0 10*3/uL (ref 0.0–0.1)
Basophils Relative: 0 %
Eosinophils Absolute: 0.2 10*3/uL (ref 0.0–0.5)
Eosinophils Relative: 3 %
HCT: 40.4 % (ref 36.0–46.0)
Hemoglobin: 12.8 g/dL (ref 12.0–15.0)
Immature Granulocytes: 0 %
Lymphocytes Relative: 29 %
Lymphs Abs: 1.8 10*3/uL (ref 0.7–4.0)
MCH: 27.9 pg (ref 26.0–34.0)
MCHC: 31.7 g/dL (ref 30.0–36.0)
MCV: 88 fL (ref 80.0–100.0)
Monocytes Absolute: 0.5 10*3/uL (ref 0.1–1.0)
Monocytes Relative: 7 %
Neutro Abs: 3.9 10*3/uL (ref 1.7–7.7)
Neutrophils Relative %: 61 %
Platelets: 368 10*3/uL (ref 150–400)
RBC: 4.59 MIL/uL (ref 3.87–5.11)
RDW: 14.6 % (ref 11.5–15.5)
WBC: 6.4 10*3/uL (ref 4.0–10.5)
nRBC: 0 % (ref 0.0–0.2)

## 2022-11-27 LAB — COMPREHENSIVE METABOLIC PANEL
ALT: 26 U/L (ref 0–44)
AST: 22 U/L (ref 15–41)
Albumin: 3.9 g/dL (ref 3.5–5.0)
Alkaline Phosphatase: 77 U/L (ref 38–126)
Anion gap: 8 (ref 5–15)
BUN: 11 mg/dL (ref 8–23)
CO2: 28 mmol/L (ref 22–32)
Calcium: 9.1 mg/dL (ref 8.9–10.3)
Chloride: 103 mmol/L (ref 98–111)
Creatinine, Ser: 0.82 mg/dL (ref 0.44–1.00)
GFR, Estimated: >60 mL/min (ref >60)
Glucose, Bld: 112 mg/dL — ABNORMAL HIGH (ref 70–99)
Potassium: 3.5 mmol/L (ref 3.5–5.1)
Sodium: 139 mmol/L (ref 135–145)
Total Bilirubin: 0.7 mg/dL (ref 0.3–1.2)
Total Protein: 6.9 g/dL (ref 6.5–8.1)

## 2022-11-27 NOTE — Progress Notes (Signed)
[place] CONSULT NOTE  Patient Care Team: Marinda Elk, MD as PCP - General (Physician Assistant) Christene Lye, MD (General Surgery) Cammie Sickle, MD as Consulting Physician (Internal Medicine)  CHIEF COMPLAINTS/PURPOSE OF CONSULTATION: Breast cancer  #  Oncology History Overview Note  July 2014 -bilateral breast cancer s/p bilateral lumpectomy; s/p bilateral breast radiation of 5040 cGy from 09/07/2013 until 10/14/2013.   # RIGHT IMC-  STAGE I [1.1cm; G-1; ER >90%; PR-NEG; Her- 2 NEG; Oncotype-Low risk RS- 15 (low risk) which corresponded to a 10 year risk of distant recurrence with tamoxifen alone of 9%]  # Left breast - [facility]  revealed 2 foci of 0.6 cm each. Three sentinel lymph nodes were negative.  Tumor was ER positive (90%), PR positive (80%), and HER-2/neu negative.  Patient finished anastrozole x5 years-2019.   BCI testing on 11/13/2018 revealed a 3.7% (95% CI:  1.4% - 5.9%) risk of late recurrence between years 5-10 and a low likelihood of benefit.  DIAGNOSIS: Bil breast cancer  STAGE:  Right-I & Left-I    ;  GOALS: cure  CURRENT/MOST RECENT THERAPY :     Carcinoma of upper-outer quadrant of right breast in female, estrogen receptor positive ([place])  07/13/2013 Initial Diagnosis   Carcinoma of upper-outer quadrant of right breast in female, estrogen receptor positive ([place])    Genetic Testing   Negative genetic testing. No pathogenic variants identified on the CancerNext-Expanded+RNA panel. VUS in AIP called c.695C>T and VUS in POT1 called c.1562A>G identified. The report date is 01/10/2022.  The CancerNext-Expanded + RNAinsight gene panel offered by [employer] and includes sequencing and rearrangement analysis for the following 77 genes: IP, ALK, APC*, ATM*, AXIN2, BAP1, BARD1, BLM, BMPR1A, BRCA1*, BRCA2*, BRIP1*, CDC73, CDH1*,CDK4, CDKN1B, CDKN2A, CHEK2*, CTNNA1, DICER1, FANCC, FH, FLCN, GALNT12, KIF1B, LZTR1, MAX, MEN1, MET,  MLH1*, MSH2*, MSH3, MSH6*, MUTYH*, NBN, NF1*, NF2, NTHL1, PALB2*, PHOX2B, PMS2*, POT1, PRKAR1A, PTCH1, PTEN*, RAD51C*, RAD51D*,RB1, RECQL, RET, SDHA, SDHAF2, SDHB, SDHC, SDHD, SMAD4, SMARCA4, SMARCB1, SMARCE1, STK11, SUFU, TMEM127, TP53*,TSC1, TSC2, VHL and XRCC2 (sequencing and deletion/duplication); EGFR, EGLN1, HOXB13, KIT, MITF, PDGFRA, POLD1 and POLE (sequencing only); EPCAM and GREM1 (deletion/duplication only).   Bilateral breast cancer ([place])  08/10/2013 Initial Diagnosis   Bilateral breast cancer ([place])     HISTORY OF PRESENTING ILLNESS: Alone. Ambulating independently.  Tina Johnston 81 y.o.  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  Patient is currently not on any therapy.  Left sided chest wall pain in last 1 weeks. Not pleuritic. Worse with movement; 6/10;   Denies any new bone pain.  No nausea vomiting headache.  No shortness of breath or cough.  Review of Systems  Constitutional:  Negative for chills, diaphoresis, fever, malaise/fatigue and weight loss.  HENT:  Negative for nosebleeds and sore throat.   Eyes:  Negative for double vision.  Respiratory:  Negative for hemoptysis, sputum production, shortness of breath and wheezing.   Cardiovascular:  Negative for chest pain, palpitations, orthopnea and leg swelling.  Gastrointestinal:  Negative for abdominal pain, blood in stool, constipation, diarrhea, heartburn, melena, nausea and vomiting.  Genitourinary:  Negative for dysuria, frequency and urgency.  Musculoskeletal:  Positive for back pain and joint pain.  Skin: Negative.  Negative for itching and rash.  Neurological:  Negative for dizziness, tingling, focal weakness, weakness and headaches.  Endo/Heme/Allergies:  Does not bruise/bleed easily.  Psychiatric/Behavioral:  Negative for depression. The patient is not  nervous/anxious and does not have insomnia.      MEDICAL HISTORY:  Past Medical History:   Diagnosis Date   Arthritis    Breast cancer ([place]) 2014   Bilateral- radiation   Breast cyst    Family history of breast cancer    Family history of lung cancer    Family history of ovarian cancer    Family history of prostate cancer    Hyperlipidemia    Hypertension    Personal history of breast cancer    Personal history of radiation therapy 2014   Bilateral lumpectomy   Vertigo    1 episode several years ago   Wears dentures    full upper and lower    SURGICAL HISTORY: Past Surgical History:  Procedure Laterality Date   ABDOMINAL HYSTERECTOMY  1968   ANTERIOR CERVICAL DECOMP/DISCECTOMY FUSION N/A 08/02/2022   Procedure: ANTERIOR CERVICAL Wound exploration and hematoma evacuation;  Surgeon: Meade Maw, MD;  Location: [facility];  Service: Neurosurgery;  Laterality: N/A;   ANTERIOR CERVICAL DECOMP/DISCECTOMY FUSION N/A 07/31/2022   Procedure: C3-4 ANTERIOR CERVICAL DISCECTOMY AND FUSION (GLOBUS HEDRON);  Surgeon: Meade Maw, MD;  Location: [facility];  Service: Neurosurgery;  Laterality: N/A;   BREAST BIOPSY Bilateral 2014   Bilateral Bx Both Positive c Bilateral Lumpectomy   BREAST CYST ASPIRATION Right 2014   Positive   BREAST LUMPECTOMY Bilateral 2014   f/u radiation    BREAST SURGERY Bilateral July 15 2013   wide excision w sentinel node biopsy   CATARACT EXTRACTION W/PHACO Right 10/30/2022   Procedure: CATARACT EXTRACTION PHACO AND INTRAOCULAR LENS PLACEMENT ([place]) RIGHT;  Surgeon: Leandrew Koyanagi, MD;  Location: [place];  Service: Ophthalmology;  Laterality: Right;  7.73 01:11.7   CATARACT EXTRACTION W/PHACO Left 11/13/2022   Procedure: CATARACT EXTRACTION PHACO AND INTRAOCULAR LENS PLACEMENT ([facility]) LEFT  10.80  01:35.8;  Surgeon: Leandrew Koyanagi, MD;  Location: [place];  Service: Ophthalmology;  Laterality: Left;   SENTINEL LYMPH NODE BIOPSY Bilateral     SOCIAL HISTORY: Social History   Socioeconomic History   Marital  status: Divorced    Spouse name: Not on file   Number of children: Not on file   Years of education: Not on file   Highest education level: Not on file  Occupational History   Not on file  Tobacco Use   Smoking status: Never   Smokeless tobacco: Never  Vaping Use   Vaping Use: Never used  Substance and Sexual Activity   Alcohol use: No   Drug use: No   Sexual activity: Not on file  Other Topics Concern   Not on file  Social History Narrative   Not on file   Social Determinants of Health   Financial Resource Strain: Not on file  Food Insecurity: Not on file  Transportation Needs: Not on file  Physical Activity: Not on file  Stress: Not on file  Social Connections: Not on file  Intimate Partner Violence: Not on file    FAMILY HISTORY: Family History  Problem Relation Age of Onset   Breast cancer Mother        dx late 31s, d. 12   Lung cancer Father        d. 80   Breast cancer Sister        dx 68   Breast cancer Sister        dx 22   Prostate cancer Brother        d. 52  Ovarian cancer Paternal Grandmother        d. 38   Lung cancer Paternal Grandfather        d. 10   Other Daughter        brain tumor, unk type    ALLERGIES:  is allergic to ultram [tramadol].  MEDICATIONS:  Current Outpatient Medications  Medication Sig Dispense Refill   acetaminophen (TYLENOL) 500 MG tablet Take 500 mg by mouth 2 (two) times daily.     amLODipine (NORVASC) 5 MG tablet Take 7.5 tablets by mouth daily.     Ascorbic Acid (VITAMIN C) 100 MG tablet Take 100 mg by mouth daily.     Calcium Carb-Cholecalciferol 600-5 MG-MCG TABS Take by mouth daily.     donepezil (ARICEPT) 5 MG tablet Take 5 mg by mouth at bedtime.     fluticasone (FLONASE) 50 MCG/ACT nasal spray Place 1 spray into both nostrils daily.     gabapentin (NEURONTIN) 100 MG capsule Take 100 mg by mouth at bedtime.     Magnesium 250 MG TABS Take by mouth at bedtime.     meclizine (ANTIVERT) 12.5 MG tablet Take 12.5  mg by mouth 3 (three) times daily as needed for dizziness.     Multiple Vitamin (MULTIVITAMIN) tablet Take 1 tablet by mouth daily.     potassium chloride (K-DUR) 10 MEQ tablet Take 1 tablet (10 mEq total) by mouth daily. (Patient taking differently: Take 10 mEq by mouth 2 (two) times daily.) 7 tablet 0   senna (SENOKOT) 8.6 MG TABS tablet Take 1 tablet (8.6 mg total) by mouth daily as needed for mild constipation. 30 tablet 0   simvastatin (ZOCOR) 20 MG tablet Take 1 tablet by mouth at bedtime.     timolol (BETIMOL) 0.25 % ophthalmic solution Place 1-2 drops into both eyes daily.     valsartan-hydrochlorothiazide (DIOVAN-HCT) 160-12.5 MG tablet Take 1 tablet by mouth daily.     No current facility-administered medications for this visit.      Marland Kitchen  PHYSICAL EXAMINATION: ECOG PERFORMANCE STATUS: 0 - Asymptomatic  Vitals:   11/27/22 1000  BP: (!) 165/92  Pulse: 72  Resp: 18  Temp: 98.1 F (36.7 C)   Filed Weights   11/27/22 1000  Weight: 145 lb 1.6 oz (65.8 kg)    Physical Exam HENT:     Head: Normocephalic and atraumatic.     Mouth/Throat:     Pharynx: No oropharyngeal exudate.  Eyes:     Pupils: Pupils are equal, round, and reactive to light.  Cardiovascular:     Rate and Rhythm: Normal rate and regular rhythm.  Pulmonary:     Effort: No respiratory distress.     Breath sounds: No wheezing.  Abdominal:     General: Bowel sounds are normal. There is no distension.     Palpations: Abdomen is soft. There is no mass.     Tenderness: There is no abdominal tenderness. There is no guarding or rebound.  Musculoskeletal:        General: No tenderness. Normal range of motion.     Cervical back: Normal range of motion and neck supple.  Skin:    General: Skin is warm.  Neurological:     Mental Status: She is alert and oriented to person, place, and time.  Psychiatric:        Mood and Affect: Affect normal.      LABORATORY DATA:  I have reviewed the data as listed Lab  Results  Component  Value Date   WBC 6.4 11/27/2022   HGB 12.8 11/27/2022   HCT 40.4 11/27/2022   MCV 88.0 11/27/2022   PLT 368 11/27/2022   Recent Labs    05/26/22 2026 07/19/22 1441 08/01/22 2132 08/02/22 0649 08/03/22 0339 08/04/22 0347 11/27/22 1002  NA 137  --  138   < > 137 143 139  K 3.2*   < > 3.4*   < > 4.1 4.1 3.5  CL 104  --  105   < > 104 108 103  CO2 25  --  26   < > _0 GLUCOSE 124*  --  147*   < > 150* 134* 112*  BUN 11  --  15   < > 27* 33* 11  CREATININE 0.74  --  0.82   < > 0.75 0.65 0.82  CALCIUM 9.6  --  9.9   < > 9.4 8.8* 9.1  GFRNONAA >60  --  >60   < > >60 >60 >60  PROT 8.1  --  7.6  --   --   --  6.9  ALBUMIN 4.0  --  4.0  --   --   --  3.9  AST 24  --  27  --   --   --  22  ALT 26  --  21  --   --   --  26  ALKPHOS 79  --  73  --   --   --  77  BILITOT 0.5  --  0.5  --   --   --  0.7   < > = values in this interval not displayed.    RADIOGRAPHIC STUDIES: I have personally reviewed the radiological images as listed and agreed with the findings in the report. No results found.  ASSESSMENT & PLAN:   Carcinoma of upper-outer quadrant of right breast in female, estrogen receptor positive ([place]) # Bil breast cancer- Stage I ER/Pos; her 2 NEG- stopped anastrzole x5 years [finished 2019].   # STABLE;  Clinically no evidence of recurrence.  Continue surveillance on a yearly basis; mammo- June 2023 WNL.   # Liver lesions- incidental [CT/MRi July 2022]- awaiting repeat MRI in Jan 2023/PCP.   # Genetics- sister diagnosed with breast cancer at 65y; mother- breast cancer.  Patient has 2 children son  [58]; daughter- 91 [brain tumor-doing well] Patient meets the NCCN criteria for genetic testing. Discussed the potential implications of a positive test-for herself; and for the rest of the family.  We will make a referral to genetics.  # OSTEOPENIA-July 2020- T score=1.6; JUNE 2023- T score= -1.6. STABLE. physically active; ca+vit D BID. Discussed the  potential risk factors for osteoporosis- age/gender/postmenopausal status/use of anti-estrogen treatments. Discussed multiple options including exercise/ calcium and vitamin D supplementation/ and also use of bisphosphonates...   # DISPOSITION:  # genetics counselling- re: Bil breast cancer- Second referral  # Follow up in 12 months-  MD; labs- cbc/cmp; bil screeening mamogram/Dexa scan in June 2023-Dr.B  All questions were answered. The patient knows to call the clinic with any problems, questions or concerns.    Cammie Sickle, MD 11/27/2022 12:01 PM

## 2022-11-27 NOTE — Progress Notes (Signed)
Right side rib pain with tenderness for the past year.

## 2022-11-27 NOTE — Assessment & Plan Note (Addendum)
#   Bil breast cancer- Stage I ER/Pos; her 2 NEG- stopped anastrzole x 5 years [finished 2019].  STABLE;  Clinically no evidence of recurrence.  Continue surveillance on a yearly basis; mammo- June 2023 WNL.   # Left chest wall pain- x1 week- likely- MSK- if not improved then call us for further wor up. Ok with Tylenol prn.   # Genetics- sister diagnosed with breast cancer at 51y; mother- breast cancer.  Patient has 2 children son  [58]; daughter- 47 [brain tumor-doing well] Patient meets the NCCN criteria for genetic testing. DEC 2022- NEGATIVE.   # OSTEOPENIA-July 2020- T score=1.6; JUNE 2023- T score= -1.6. STABLE. physically active; ca+vit D BID. Discussed the potential risk factors for osteoporosis- age/gender/postmenopausal status/use of anti-estrogen treatments. Discussed multiple options including exercise/ calcium and vitamin D supplementation. HOLD off  use of bisphosphonates.   #Since patient is clinically stable I think is reasonable for the patient to follow-up with PCP/can follow-up with [place] as needed.  Patient comfortable with the plan; to call us if any questions or concerns in the interim.  # DISPOSITION:  # as needed- Dr.B

## 2022-12-12 DIAGNOSIS — Z961 Presence of intraocular lens: Secondary | ICD-10-CM | POA: Diagnosis not present

## 2022-12-12 DIAGNOSIS — H524 Presbyopia: Secondary | ICD-10-CM | POA: Diagnosis not present

## 2022-12-12 DIAGNOSIS — H5203 Hypermetropia, bilateral: Secondary | ICD-10-CM | POA: Diagnosis not present

## 2022-12-12 DIAGNOSIS — H401231 Low-tension glaucoma, bilateral, mild stage: Secondary | ICD-10-CM | POA: Diagnosis not present

## 2022-12-12 DIAGNOSIS — H52223 Regular astigmatism, bilateral: Secondary | ICD-10-CM | POA: Diagnosis not present

## 2022-12-26 DIAGNOSIS — E041 Nontoxic single thyroid nodule: Secondary | ICD-10-CM | POA: Diagnosis not present

## 2023-03-17 DIAGNOSIS — H524 Presbyopia: Secondary | ICD-10-CM | POA: Diagnosis not present

## 2023-03-17 DIAGNOSIS — H401231 Low-tension glaucoma, bilateral, mild stage: Secondary | ICD-10-CM | POA: Diagnosis not present

## 2023-03-17 DIAGNOSIS — Z961 Presence of intraocular lens: Secondary | ICD-10-CM | POA: Diagnosis not present

## 2023-03-17 DIAGNOSIS — H5203 Hypermetropia, bilateral: Secondary | ICD-10-CM | POA: Diagnosis not present

## 2023-03-17 DIAGNOSIS — H52223 Regular astigmatism, bilateral: Secondary | ICD-10-CM | POA: Diagnosis not present

## 2023-03-24 DIAGNOSIS — R7303 Prediabetes: Secondary | ICD-10-CM | POA: Diagnosis not present

## 2023-03-24 DIAGNOSIS — E782 Mixed hyperlipidemia: Secondary | ICD-10-CM | POA: Diagnosis not present

## 2023-03-24 DIAGNOSIS — I10 Essential (primary) hypertension: Secondary | ICD-10-CM | POA: Diagnosis not present

## 2023-03-24 DIAGNOSIS — E041 Nontoxic single thyroid nodule: Secondary | ICD-10-CM | POA: Diagnosis not present

## 2023-04-01 DIAGNOSIS — G959 Disease of spinal cord, unspecified: Secondary | ICD-10-CM | POA: Diagnosis not present

## 2023-04-01 DIAGNOSIS — Z1231 Encounter for screening mammogram for malignant neoplasm of breast: Secondary | ICD-10-CM | POA: Diagnosis not present

## 2023-04-01 DIAGNOSIS — R7303 Prediabetes: Secondary | ICD-10-CM | POA: Diagnosis not present

## 2023-04-01 DIAGNOSIS — Z0000 Encounter for general adult medical examination without abnormal findings: Secondary | ICD-10-CM | POA: Diagnosis not present

## 2023-04-01 DIAGNOSIS — G3184 Mild cognitive impairment, so stated: Secondary | ICD-10-CM | POA: Diagnosis not present

## 2023-04-01 DIAGNOSIS — E782 Mixed hyperlipidemia: Secondary | ICD-10-CM | POA: Diagnosis not present

## 2023-04-01 DIAGNOSIS — C50911 Malignant neoplasm of unspecified site of right female breast: Secondary | ICD-10-CM | POA: Diagnosis not present

## 2023-04-01 DIAGNOSIS — Z170 Estrogen receptor positive status [ER+]: Secondary | ICD-10-CM | POA: Diagnosis not present

## 2023-04-01 DIAGNOSIS — C50912 Malignant neoplasm of unspecified site of left female breast: Secondary | ICD-10-CM | POA: Diagnosis not present

## 2023-04-01 DIAGNOSIS — I10 Essential (primary) hypertension: Secondary | ICD-10-CM | POA: Diagnosis not present

## 2023-04-07 ENCOUNTER — Other Ambulatory Visit: Payer: Self-pay | Admitting: Physician Assistant

## 2023-04-07 DIAGNOSIS — Z1231 Encounter for screening mammogram for malignant neoplasm of breast: Secondary | ICD-10-CM

## 2023-04-10 DIAGNOSIS — Z03818 Encounter for observation for suspected exposure to other biological agents ruled out: Secondary | ICD-10-CM | POA: Diagnosis not present

## 2023-04-10 DIAGNOSIS — U071 COVID-19: Secondary | ICD-10-CM | POA: Diagnosis not present

## 2023-04-17 ENCOUNTER — Encounter: Payer: Self-pay | Admitting: Internal Medicine

## 2023-04-23 ENCOUNTER — Other Ambulatory Visit: Payer: Self-pay

## 2023-04-23 DIAGNOSIS — G959 Disease of spinal cord, unspecified: Secondary | ICD-10-CM

## 2023-04-24 ENCOUNTER — Ambulatory Visit
Admission: RE | Admit: 2023-04-24 | Discharge: 2023-04-24 | Disposition: A | Discharge status: Home / Self Care | Payer: HMO | Attending: Neurosurgery | Admitting: Neurosurgery

## 2023-04-24 ENCOUNTER — Ambulatory Visit: Payer: Self-pay | Admitting: Neurosurgery

## 2023-04-24 ENCOUNTER — Ambulatory Visit
Admission: RE | Admit: 2023-04-24 | Discharge: 2023-04-24 | Disposition: A | Discharge status: Home / Self Care | Payer: HMO | Source: Ambulatory Visit | Attending: Neurosurgery | Admitting: Neurosurgery

## 2023-04-24 DIAGNOSIS — G959 Disease of spinal cord, unspecified: Secondary | ICD-10-CM | POA: Diagnosis not present

## 2023-04-29 ENCOUNTER — Ambulatory Visit (INDEPENDENT_AMBULATORY_CARE_PROVIDER_SITE_OTHER): Payer: HMO | Admitting: Neurosurgery

## 2023-04-29 ENCOUNTER — Encounter: Payer: Self-pay | Admitting: Neurosurgery

## 2023-04-29 ENCOUNTER — Encounter: Payer: Self-pay | Admitting: Internal Medicine

## 2023-04-29 VITALS — BP 131/70 | Ht 63.0 in | Wt 145.0 lb

## 2023-04-29 DIAGNOSIS — G959 Disease of spinal cord, unspecified: Secondary | ICD-10-CM

## 2023-04-29 DIAGNOSIS — Z09 Encounter for follow-up examination after completed treatment for conditions other than malignant neoplasm: Secondary | ICD-10-CM | POA: Diagnosis not present

## 2023-04-29 NOTE — Progress Notes (Signed)
   REFERRING PHYSICIAN:  Patrice Paradise, Md [address] [facility] [place],  [place] [address]  DOS: C3-4 ACDF 07/31/22 and hematoma evacuation on 08/02/22.   HISTORY OF PRESENT ILLNESS: Tina Johnston is status post ACDF and hematoma evacuation.  She is doing very well.  She has no pain.      PHYSICAL EXAMINATION:  NEUROLOGICAL:  General: In no acute distress.   Awake, alert, oriented to person, place, and time.  Pupils equal round and reactive to light.    Strength: Side Biceps Triceps Deltoid Interossei Grip Wrist Ext. Wrist Flex.  R 5 5 5 5 5 5 5   L 5 5 5 5 5 5 5    Incision c/d/I and healing well.    Imaging:  08/01/22 CT soft tissue neck IMPRESSION: 1. Postoperative changes from recent ACDF at C3-4. Extensive swelling and edema throughout the adjacent prevertebral soft tissues, with extension along the left anterior cervical approach. Intermediate to high density complex fluid throughout this region, consistent with blood products/hematoma. No visible active contrast extravasation. Secondary mass effect on the supraglottic airway which is displaced anteriorly and slightly deviated to the right. 2. 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 An abnormally enlarged lymph node would be the primary differential consideration. Further evaluation with dedicated thyroid ultrasound recommended. This could be performed on a nonemergent basis. 3. 7 mm heterogeneous right level 2 lymph node with possible internal necrosis, of uncertain etiology or significance. Attention at follow-up recommended. 4. Few small right upper lobe pulmonary nodules as above, measuring up to 5 mm as above. Per Fleischner Society Guidelines, a non-contrast Chest CT at 12 months is optional. If performed and the nodule is stable at 12 months, no further follow-up is recommended. These  guidelines do not apply to immunocompromised patients and patients with cancer. Follow up in patients with significant comorbidities as clinically warranted. For lung cancer screening, adhere to Lung-RADS guidelines. Reference: Radiology. 2017; 284(1):228-43.   Results discussed by telephone at the time of interpretation on 08/01/2022 at 11:20 pm with provider PATRICK ROBINSON.     Electronically Signed   By: Rise Mu M.D.   On: 08/01/2022 23:42  X-rays from May 2024 reviewed-no complications noted.  Assessment / Plan: Tina Johnston is doing well after ACDF and subsequent hematoma evacuation.  I think she is doing extremely well.    I will see her back as needed.  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  I spent a total of 10 minutes in this patient's care today. This time was spent reviewing pertinent records including imaging studies, obtaining and confirming history, performing a directed evaluation, formulating and discussing my recommendations, and documenting the visit within the medical record.    Venetia Night MD Dept of Neurosurgery

## 2023-06-12 ENCOUNTER — Ambulatory Visit
Admission: RE | Admit: 2023-06-12 | Discharge: 2023-06-12 | Disposition: A | Discharge status: Home / Self Care | Payer: HMO | Source: Ambulatory Visit | Attending: Physician Assistant | Admitting: Physician Assistant

## 2023-06-12 DIAGNOSIS — Z1231 Encounter for screening mammogram for malignant neoplasm of breast: Secondary | ICD-10-CM | POA: Diagnosis not present

## 2023-06-24 DIAGNOSIS — H401231 Low-tension glaucoma, bilateral, mild stage: Secondary | ICD-10-CM | POA: Diagnosis not present

## 2023-06-24 DIAGNOSIS — H52223 Regular astigmatism, bilateral: Secondary | ICD-10-CM | POA: Diagnosis not present

## 2023-06-24 DIAGNOSIS — H5203 Hypermetropia, bilateral: Secondary | ICD-10-CM | POA: Diagnosis not present

## 2023-06-24 DIAGNOSIS — H524 Presbyopia: Secondary | ICD-10-CM | POA: Diagnosis not present

## 2023-06-24 DIAGNOSIS — Z961 Presence of intraocular lens: Secondary | ICD-10-CM | POA: Diagnosis not present

## 2023-09-24 DIAGNOSIS — R7303 Prediabetes: Secondary | ICD-10-CM | POA: Diagnosis not present

## 2023-09-24 DIAGNOSIS — E782 Mixed hyperlipidemia: Secondary | ICD-10-CM | POA: Diagnosis not present

## 2023-09-24 DIAGNOSIS — I10 Essential (primary) hypertension: Secondary | ICD-10-CM | POA: Diagnosis not present

## 2023-09-26 DIAGNOSIS — H5203 Hypermetropia, bilateral: Secondary | ICD-10-CM | POA: Diagnosis not present

## 2023-09-26 DIAGNOSIS — H52223 Regular astigmatism, bilateral: Secondary | ICD-10-CM | POA: Diagnosis not present

## 2023-09-26 DIAGNOSIS — Z961 Presence of intraocular lens: Secondary | ICD-10-CM | POA: Diagnosis not present

## 2023-09-26 DIAGNOSIS — H524 Presbyopia: Secondary | ICD-10-CM | POA: Diagnosis not present

## 2023-09-26 DIAGNOSIS — H401231 Low-tension glaucoma, bilateral, mild stage: Secondary | ICD-10-CM | POA: Diagnosis not present

## 2023-10-01 DIAGNOSIS — Z0000 Encounter for general adult medical examination without abnormal findings: Secondary | ICD-10-CM | POA: Diagnosis not present

## 2023-10-01 DIAGNOSIS — R7303 Prediabetes: Secondary | ICD-10-CM | POA: Diagnosis not present

## 2023-10-01 DIAGNOSIS — G3184 Mild cognitive impairment, so stated: Secondary | ICD-10-CM | POA: Diagnosis not present

## 2023-10-01 DIAGNOSIS — E782 Mixed hyperlipidemia: Secondary | ICD-10-CM | POA: Diagnosis not present

## 2023-10-01 DIAGNOSIS — C50912 Malignant neoplasm of unspecified site of left female breast: Secondary | ICD-10-CM | POA: Diagnosis not present

## 2023-10-01 DIAGNOSIS — R1011 Right upper quadrant pain: Secondary | ICD-10-CM | POA: Diagnosis not present

## 2023-10-01 DIAGNOSIS — I10 Essential (primary) hypertension: Secondary | ICD-10-CM | POA: Diagnosis not present

## 2023-10-01 DIAGNOSIS — M150 Primary generalized (osteo)arthritis: Secondary | ICD-10-CM | POA: Diagnosis not present

## 2023-10-01 DIAGNOSIS — C50911 Malignant neoplasm of unspecified site of right female breast: Secondary | ICD-10-CM | POA: Diagnosis not present

## 2023-10-01 DIAGNOSIS — Z780 Asymptomatic menopausal state: Secondary | ICD-10-CM | POA: Diagnosis not present

## 2023-10-01 DIAGNOSIS — Z170 Estrogen receptor positive status [ER+]: Secondary | ICD-10-CM | POA: Diagnosis not present

## 2023-10-01 DIAGNOSIS — E041 Nontoxic single thyroid nodule: Secondary | ICD-10-CM | POA: Diagnosis not present

## 2023-11-10 IMAGING — CT CT HEAD W/O CM
4 series · 16 of 47 positions shown, 18 images · non-contrast
Comparison: 12/27/2018.  10/31/2014.

CLINICAL DATA: Dizziness and headache over the last 2 months.



[Series 2: head wo · axial · 0.40mm/px · z∈[+246,+356]mm · 7 of 30 slices shown, 9 images]
[im 4/30  brain]
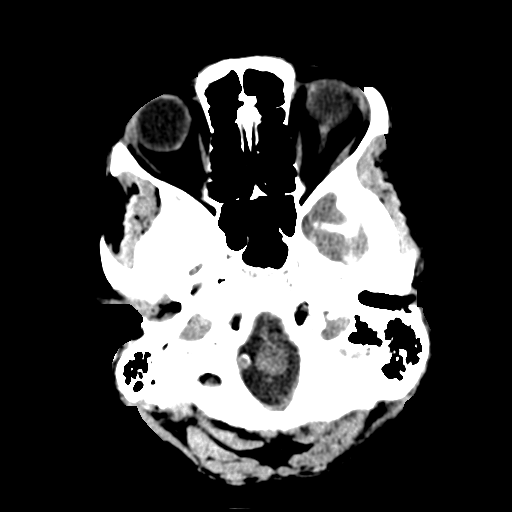
[im 4/30  bone]
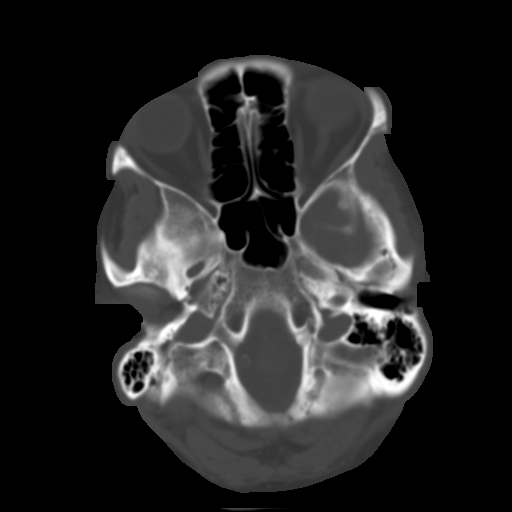
[im 8/30  brain]
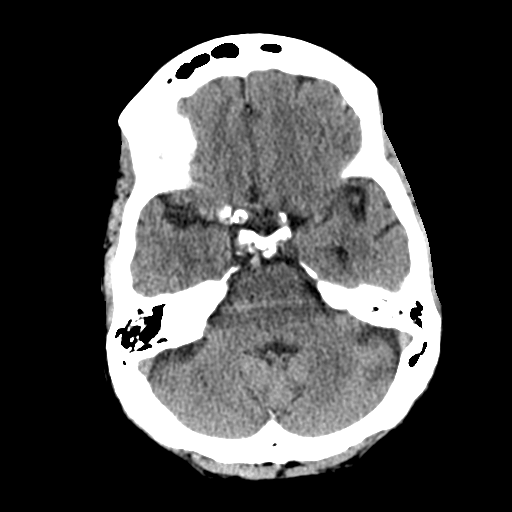
[im 11/30  brain]
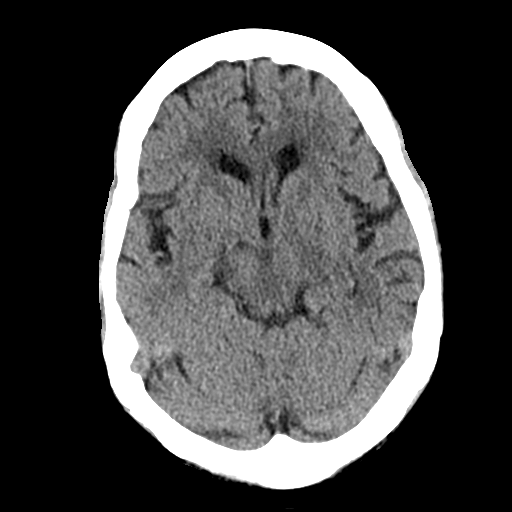
[im 15/30  brain]
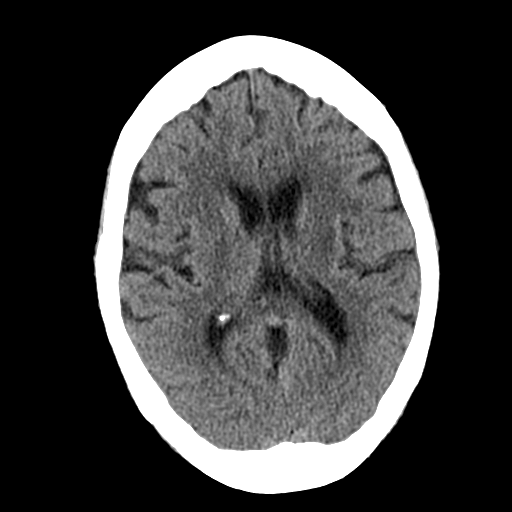
[im 19/30  brain]
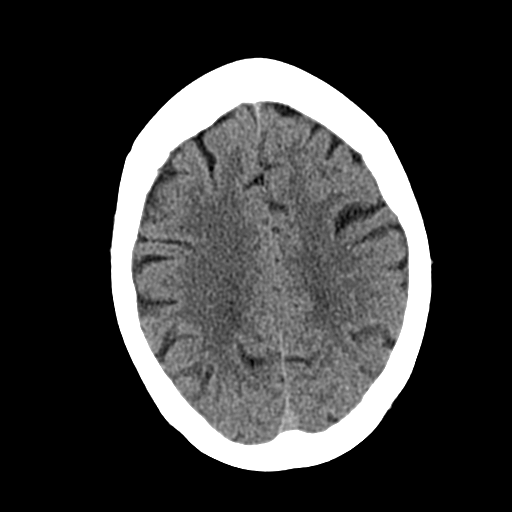
[im 19/30  bone]
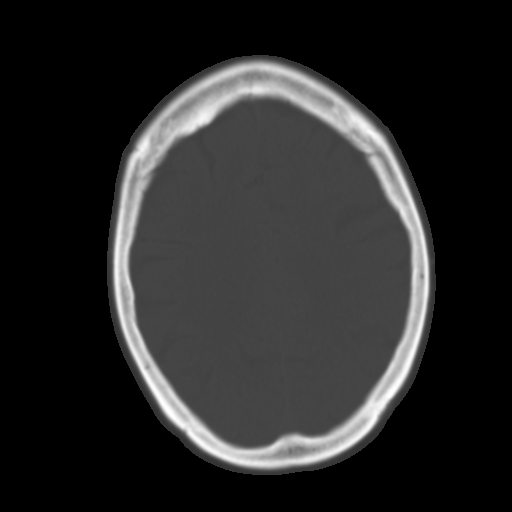
[im 22/30  brain]
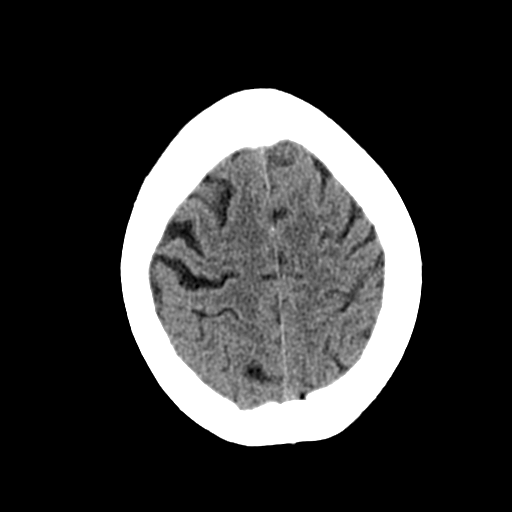
[im 26/30  brain]
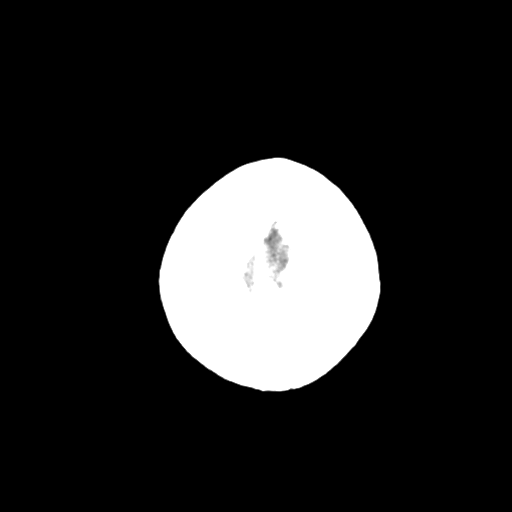

[Series 3: head bone · axial · 0.40mm/px · z∈[+245,+273]mm · 3 of 74 slices shown]
[im 8/74  bone]
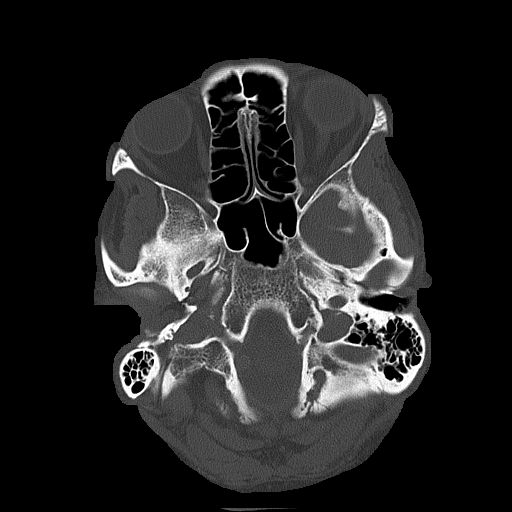
[im 15/74  bone]
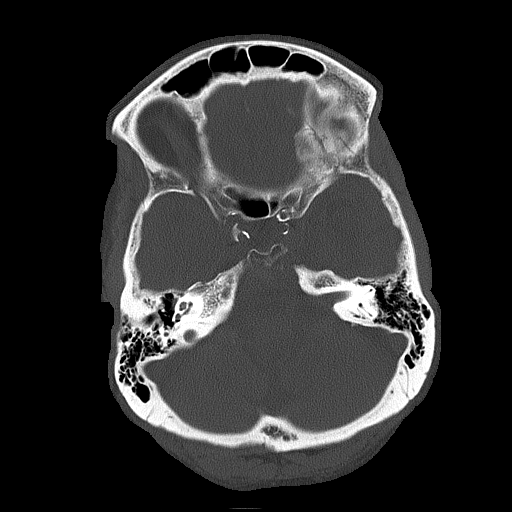
[im 22/74  bone]
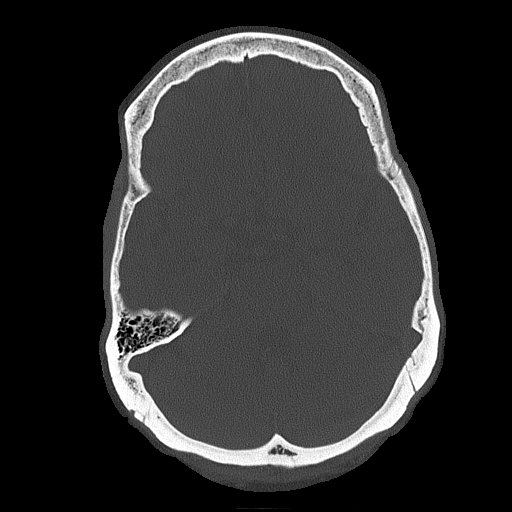

[Series 4: coronal soft tissue · coronal · 0.32mm/px · 3 of 67 slices shown]
[im 23/67  brain]
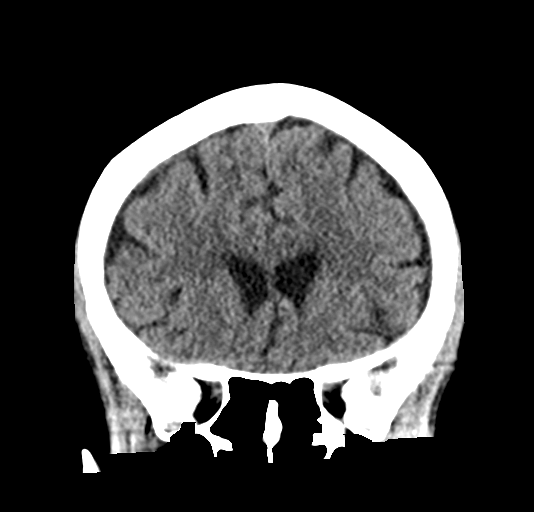
[im 30/67  brain]
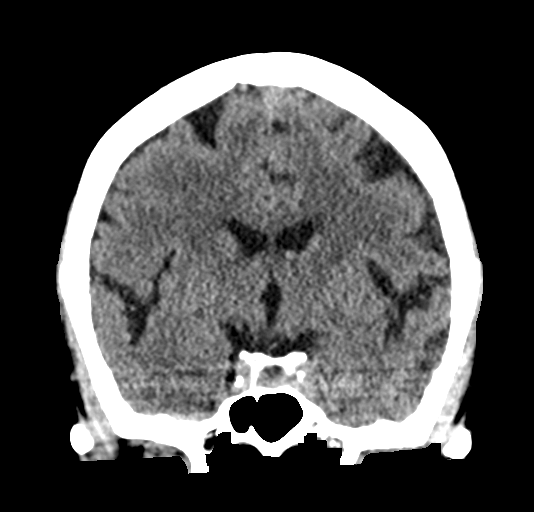
[im 37/67  brain]
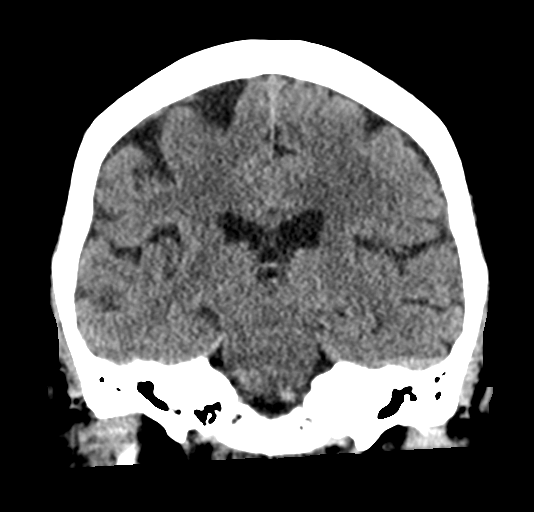

[Series 5: sagittal soft tissue · sagittal · 0.33mm/px · 3 of 57 slices shown]
[im 19/57  brain]
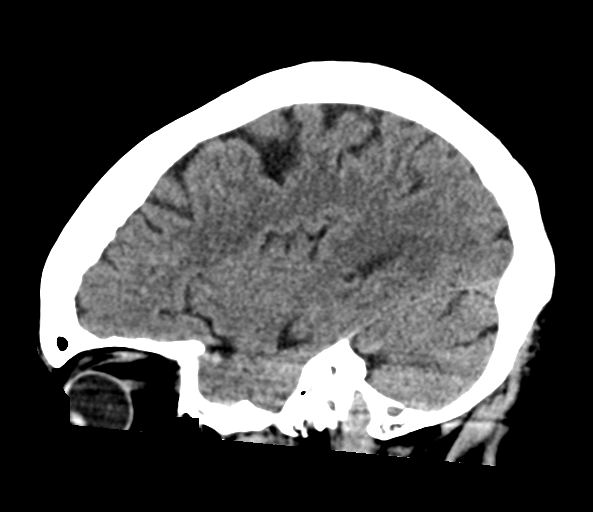
[im 29/57  brain]
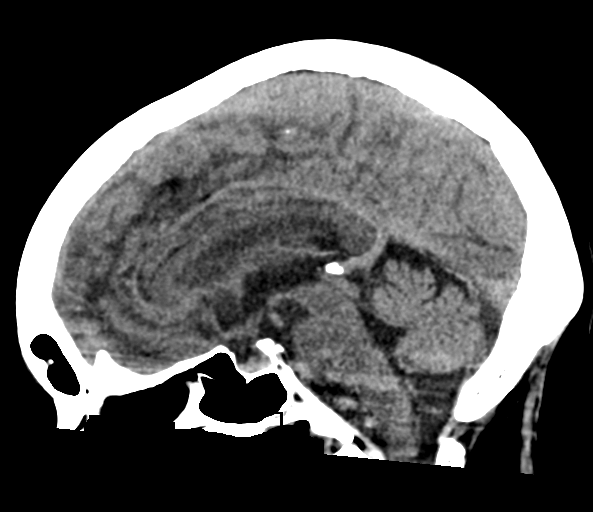
[im 38/57  brain]
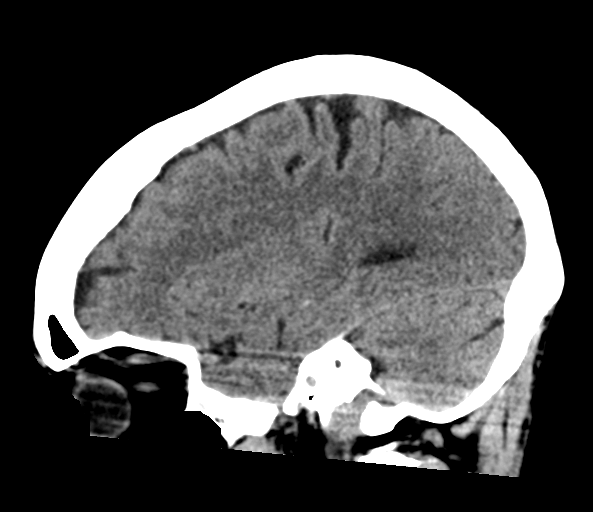

[16 of 47 positions shown; findings below may reference images not displayed]

FINDINGS: Brain: No sign of acute infarction. There is dolichoectasia of the
right vertebral artery which indents the cervicomedullary junction
region. This is a chronic finding. No focal brainstem or cerebellar
infarction. Mild to moderate chronic small-vessel ischemic changes
affect the cerebral hemispheric white matter. No cortical or large
vessel territory infarction. No mass lesion, hemorrhage,
hydrocephalus or extra-axial collection.

Vascular: There is atherosclerotic calcification of the major
vessels at the base of the brain. As noted above, there is
dolichoectasia of the right vertebral artery which indents the
cervicomedullary junction. The significance of this is doubtful.

Skull: Normal

Sinuses/Orbits: Clear/normal

Other: None
IMPRESSION: No acute CT finding. Chronic small-vessel ischemic changes of the
cerebral hemispheric white matter as seen previously.

Dolichoectasia of the right vertebral artery as seen previously.
This indents the cervicomedullary junction. Vascular indentation is
not unusual. This is a chronic finding in this patient. However,
given the presenting symptoms, one wonders if this could have some
relation to that, though that is speculation and doubtful.

## 2023-12-01 ENCOUNTER — Ambulatory Visit
Admission: RE | Admit: 2023-12-01 | Discharge: 2023-12-01 | Disposition: A | Discharge status: Home / Self Care | Payer: HMO | Source: Ambulatory Visit | Attending: Physician Assistant | Admitting: Physician Assistant

## 2023-12-01 ENCOUNTER — Other Ambulatory Visit: Payer: Self-pay | Admitting: Physician Assistant

## 2023-12-01 DIAGNOSIS — R42 Dizziness and giddiness: Secondary | ICD-10-CM | POA: Insufficient documentation

## 2023-12-01 DIAGNOSIS — E782 Mixed hyperlipidemia: Secondary | ICD-10-CM | POA: Diagnosis not present

## 2023-12-01 DIAGNOSIS — R4781 Slurred speech: Secondary | ICD-10-CM | POA: Insufficient documentation

## 2023-12-01 DIAGNOSIS — I10 Essential (primary) hypertension: Secondary | ICD-10-CM | POA: Diagnosis not present

## 2023-12-01 DIAGNOSIS — G3184 Mild cognitive impairment, so stated: Secondary | ICD-10-CM | POA: Diagnosis not present

## 2023-12-31 DIAGNOSIS — I672 Cerebral atherosclerosis: Secondary | ICD-10-CM | POA: Diagnosis not present

## 2023-12-31 DIAGNOSIS — R42 Dizziness and giddiness: Secondary | ICD-10-CM | POA: Diagnosis not present

## 2023-12-31 DIAGNOSIS — R4781 Slurred speech: Secondary | ICD-10-CM | POA: Diagnosis not present

## 2024-01-12 DIAGNOSIS — H524 Presbyopia: Secondary | ICD-10-CM | POA: Diagnosis not present

## 2024-01-12 DIAGNOSIS — H401231 Low-tension glaucoma, bilateral, mild stage: Secondary | ICD-10-CM | POA: Diagnosis not present

## 2024-01-12 DIAGNOSIS — H52223 Regular astigmatism, bilateral: Secondary | ICD-10-CM | POA: Diagnosis not present

## 2024-01-12 DIAGNOSIS — H5203 Hypermetropia, bilateral: Secondary | ICD-10-CM | POA: Diagnosis not present

## 2024-03-31 DIAGNOSIS — R7303 Prediabetes: Secondary | ICD-10-CM | POA: Diagnosis not present

## 2024-03-31 DIAGNOSIS — I10 Essential (primary) hypertension: Secondary | ICD-10-CM | POA: Diagnosis not present

## 2024-03-31 DIAGNOSIS — E782 Mixed hyperlipidemia: Secondary | ICD-10-CM | POA: Diagnosis not present

## 2024-04-07 ENCOUNTER — Other Ambulatory Visit: Payer: Self-pay | Admitting: Physician Assistant

## 2024-04-07 DIAGNOSIS — E782 Mixed hyperlipidemia: Secondary | ICD-10-CM | POA: Diagnosis not present

## 2024-04-07 DIAGNOSIS — Z0000 Encounter for general adult medical examination without abnormal findings: Secondary | ICD-10-CM | POA: Diagnosis not present

## 2024-04-07 DIAGNOSIS — Z170 Estrogen receptor positive status [ER+]: Secondary | ICD-10-CM | POA: Diagnosis not present

## 2024-04-07 DIAGNOSIS — Z1231 Encounter for screening mammogram for malignant neoplasm of breast: Secondary | ICD-10-CM

## 2024-04-07 DIAGNOSIS — I10 Essential (primary) hypertension: Secondary | ICD-10-CM | POA: Diagnosis not present

## 2024-04-07 DIAGNOSIS — M150 Primary generalized (osteo)arthritis: Secondary | ICD-10-CM | POA: Diagnosis not present

## 2024-04-07 DIAGNOSIS — C50912 Malignant neoplasm of unspecified site of left female breast: Secondary | ICD-10-CM | POA: Diagnosis not present

## 2024-04-07 DIAGNOSIS — G3184 Mild cognitive impairment, so stated: Secondary | ICD-10-CM | POA: Diagnosis not present

## 2024-04-07 DIAGNOSIS — R1011 Right upper quadrant pain: Secondary | ICD-10-CM | POA: Diagnosis not present

## 2024-04-07 DIAGNOSIS — R7303 Prediabetes: Secondary | ICD-10-CM | POA: Diagnosis not present

## 2024-04-07 DIAGNOSIS — C50911 Malignant neoplasm of unspecified site of right female breast: Secondary | ICD-10-CM | POA: Diagnosis not present

## 2024-05-04 DIAGNOSIS — G3184 Mild cognitive impairment, so stated: Secondary | ICD-10-CM | POA: Diagnosis not present

## 2024-05-04 DIAGNOSIS — R0683 Snoring: Secondary | ICD-10-CM | POA: Diagnosis not present

## 2024-05-04 DIAGNOSIS — G959 Disease of spinal cord, unspecified: Secondary | ICD-10-CM | POA: Diagnosis not present

## 2024-05-04 DIAGNOSIS — G479 Sleep disorder, unspecified: Secondary | ICD-10-CM | POA: Diagnosis not present

## 2024-05-04 DIAGNOSIS — R471 Dysarthria and anarthria: Secondary | ICD-10-CM | POA: Diagnosis not present

## 2024-05-04 DIAGNOSIS — E538 Deficiency of other specified B group vitamins: Secondary | ICD-10-CM | POA: Diagnosis not present

## 2024-05-05 ENCOUNTER — Other Ambulatory Visit: Payer: Self-pay

## 2024-05-05 DIAGNOSIS — G3184 Mild cognitive impairment, so stated: Secondary | ICD-10-CM

## 2024-05-06 ENCOUNTER — Ambulatory Visit
Admission: RE | Admit: 2024-05-06 | Discharge: 2024-05-06 | Disposition: A | Discharge status: Home / Self Care | Source: Ambulatory Visit

## 2024-05-06 DIAGNOSIS — G3184 Mild cognitive impairment, so stated: Secondary | ICD-10-CM | POA: Insufficient documentation

## 2024-05-06 DIAGNOSIS — R9089 Other abnormal findings on diagnostic imaging of central nervous system: Secondary | ICD-10-CM | POA: Diagnosis not present

## 2024-05-06 DIAGNOSIS — I6782 Cerebral ischemia: Secondary | ICD-10-CM | POA: Diagnosis not present

## 2024-05-10 DIAGNOSIS — H401231 Low-tension glaucoma, bilateral, mild stage: Secondary | ICD-10-CM | POA: Diagnosis not present

## 2024-06-14 ENCOUNTER — Ambulatory Visit
Admission: RE | Admit: 2024-06-14 | Discharge: 2024-06-14 | Disposition: A | Discharge status: Home / Self Care | Source: Ambulatory Visit | Attending: Physician Assistant | Admitting: Physician Assistant

## 2024-06-14 DIAGNOSIS — Z1231 Encounter for screening mammogram for malignant neoplasm of breast: Secondary | ICD-10-CM | POA: Insufficient documentation

## 2024-06-18 DIAGNOSIS — G3184 Mild cognitive impairment, so stated: Secondary | ICD-10-CM | POA: Diagnosis not present

## 2024-06-18 DIAGNOSIS — R471 Dysarthria and anarthria: Secondary | ICD-10-CM | POA: Diagnosis not present

## 2024-06-18 DIAGNOSIS — G479 Sleep disorder, unspecified: Secondary | ICD-10-CM | POA: Diagnosis not present

## 2024-06-18 DIAGNOSIS — Z8673 Personal history of transient ischemic attack (TIA), and cerebral infarction without residual deficits: Secondary | ICD-10-CM | POA: Diagnosis not present

## 2024-06-18 DIAGNOSIS — E538 Deficiency of other specified B group vitamins: Secondary | ICD-10-CM | POA: Diagnosis not present

## 2024-06-18 DIAGNOSIS — R0683 Snoring: Secondary | ICD-10-CM | POA: Diagnosis not present

## 2024-09-20 DIAGNOSIS — H401231 Low-tension glaucoma, bilateral, mild stage: Secondary | ICD-10-CM | POA: Diagnosis not present

## 2024-09-21 DIAGNOSIS — G3184 Mild cognitive impairment, so stated: Secondary | ICD-10-CM | POA: Diagnosis not present

## 2024-09-28 DIAGNOSIS — I10 Essential (primary) hypertension: Secondary | ICD-10-CM | POA: Diagnosis not present

## 2024-09-28 DIAGNOSIS — R7303 Prediabetes: Secondary | ICD-10-CM | POA: Diagnosis not present

## 2024-09-28 DIAGNOSIS — E782 Mixed hyperlipidemia: Secondary | ICD-10-CM | POA: Diagnosis not present

## 2024-10-05 DIAGNOSIS — R7303 Prediabetes: Secondary | ICD-10-CM | POA: Diagnosis not present

## 2024-10-05 DIAGNOSIS — E782 Mixed hyperlipidemia: Secondary | ICD-10-CM | POA: Diagnosis not present

## 2024-10-05 DIAGNOSIS — C50912 Malignant neoplasm of unspecified site of left female breast: Secondary | ICD-10-CM | POA: Diagnosis not present

## 2024-10-05 DIAGNOSIS — G3184 Mild cognitive impairment, so stated: Secondary | ICD-10-CM | POA: Diagnosis not present

## 2024-10-05 DIAGNOSIS — Z0000 Encounter for general adult medical examination without abnormal findings: Secondary | ICD-10-CM | POA: Diagnosis not present

## 2024-10-05 DIAGNOSIS — Z1331 Encounter for screening for depression: Secondary | ICD-10-CM | POA: Diagnosis not present

## 2024-10-05 DIAGNOSIS — Z170 Estrogen receptor positive status [ER+]: Secondary | ICD-10-CM | POA: Diagnosis not present

## 2024-10-05 DIAGNOSIS — M150 Primary generalized (osteo)arthritis: Secondary | ICD-10-CM | POA: Diagnosis not present

## 2024-10-05 DIAGNOSIS — I10 Essential (primary) hypertension: Secondary | ICD-10-CM | POA: Diagnosis not present

## 2024-10-05 DIAGNOSIS — C50911 Malignant neoplasm of unspecified site of right female breast: Secondary | ICD-10-CM | POA: Diagnosis not present

## 2024-10-05 DIAGNOSIS — Z780 Asymptomatic menopausal state: Secondary | ICD-10-CM | POA: Diagnosis not present

## 2024-12-28 NOTE — Progress Notes (Signed)
 Tina Johnston                                          MRN: [id number]   12/28/2024   The [facility] Quality Team Specialist reviewed this patient medical record for the purposes of chart review for care gap closure. The following were reviewed: chart review for care gap closure-controlling blood pressure.    [facility] Quality Team
